# Patient Record
Sex: Male | Born: 1935
Health system: Southern US, Community
[De-identification: ages and names within clinical notes are randomized; demographics above are authoritative.]

## PROBLEM LIST (undated history)

## (undated) DIAGNOSIS — H409 Unspecified glaucoma: Secondary | ICD-10-CM

## (undated) DIAGNOSIS — G4730 Sleep apnea, unspecified: Secondary | ICD-10-CM

## (undated) DIAGNOSIS — I10 Essential (primary) hypertension: Secondary | ICD-10-CM

## (undated) DIAGNOSIS — N400 Enlarged prostate without lower urinary tract symptoms: Secondary | ICD-10-CM

## (undated) DIAGNOSIS — M1990 Unspecified osteoarthritis, unspecified site: Secondary | ICD-10-CM

## (undated) DIAGNOSIS — E785 Hyperlipidemia, unspecified: Secondary | ICD-10-CM

## (undated) DIAGNOSIS — K219 Gastro-esophageal reflux disease without esophagitis: Secondary | ICD-10-CM

## (undated) DIAGNOSIS — J45909 Unspecified asthma, uncomplicated: Secondary | ICD-10-CM

## (undated) HISTORY — DX: Hyperlipidemia, unspecified: E78.5

## (undated) HISTORY — PX: COSMETIC SURGERY: SHX468

## (undated) HISTORY — DX: Gastro-esophageal reflux disease without esophagitis: K21.9

## (undated) HISTORY — DX: Essential (primary) hypertension: I10

## (undated) HISTORY — DX: Benign prostatic hyperplasia without lower urinary tract symptoms: N40.0

## (undated) HISTORY — DX: Unspecified asthma, uncomplicated: J45.909

## (undated) HISTORY — DX: Unspecified glaucoma: H40.9

---

## 2004-12-18 ENCOUNTER — Ambulatory Visit: Payer: Self-pay

## 2006-05-15 ENCOUNTER — Ambulatory Visit: Payer: Self-pay | Admitting: Unknown Physician Specialty

## 2010-05-10 ENCOUNTER — Emergency Department: Payer: Self-pay | Admitting: Internal Medicine

## 2011-08-27 ENCOUNTER — Ambulatory Visit: Payer: Self-pay | Admitting: Unknown Physician Specialty

## 2011-12-19 ENCOUNTER — Ambulatory Visit: Payer: Self-pay | Admitting: Family Medicine

## 2013-12-21 DIAGNOSIS — J4520 Mild intermittent asthma, uncomplicated: Secondary | ICD-10-CM | POA: Insufficient documentation

## 2013-12-21 DIAGNOSIS — G4730 Sleep apnea, unspecified: Secondary | ICD-10-CM

## 2013-12-21 DIAGNOSIS — J449 Chronic obstructive pulmonary disease, unspecified: Secondary | ICD-10-CM | POA: Insufficient documentation

## 2013-12-21 DIAGNOSIS — G4710 Hypersomnia, unspecified: Secondary | ICD-10-CM | POA: Insufficient documentation

## 2015-04-06 DIAGNOSIS — N400 Enlarged prostate without lower urinary tract symptoms: Secondary | ICD-10-CM | POA: Insufficient documentation

## 2015-04-06 DIAGNOSIS — I10 Essential (primary) hypertension: Secondary | ICD-10-CM | POA: Insufficient documentation

## 2015-04-06 DIAGNOSIS — N138 Other obstructive and reflux uropathy: Secondary | ICD-10-CM | POA: Insufficient documentation

## 2015-04-06 DIAGNOSIS — N401 Enlarged prostate with lower urinary tract symptoms: Secondary | ICD-10-CM

## 2015-04-13 ENCOUNTER — Encounter: Payer: Self-pay | Admitting: Family Medicine

## 2015-04-13 ENCOUNTER — Ambulatory Visit (INDEPENDENT_AMBULATORY_CARE_PROVIDER_SITE_OTHER): Payer: Medicare Other | Admitting: Family Medicine

## 2015-04-13 VITALS — BP 132/77 | HR 82 | Temp 97.9°F | Resp 18 | Ht 68.0 in | Wt 262.5 lb

## 2015-04-13 DIAGNOSIS — I10 Essential (primary) hypertension: Secondary | ICD-10-CM | POA: Diagnosis not present

## 2015-04-13 DIAGNOSIS — G4730 Sleep apnea, unspecified: Secondary | ICD-10-CM | POA: Diagnosis not present

## 2015-04-13 DIAGNOSIS — E7800 Pure hypercholesterolemia, unspecified: Secondary | ICD-10-CM

## 2015-04-13 DIAGNOSIS — G4710 Hypersomnia, unspecified: Secondary | ICD-10-CM | POA: Diagnosis not present

## 2015-04-13 DIAGNOSIS — J42 Unspecified chronic bronchitis: Secondary | ICD-10-CM | POA: Diagnosis not present

## 2015-04-13 DIAGNOSIS — N138 Other obstructive and reflux uropathy: Secondary | ICD-10-CM

## 2015-04-13 DIAGNOSIS — N401 Enlarged prostate with lower urinary tract symptoms: Secondary | ICD-10-CM

## 2015-04-13 DIAGNOSIS — E785 Hyperlipidemia, unspecified: Secondary | ICD-10-CM | POA: Insufficient documentation

## 2015-04-13 MED ORDER — PRAVASTATIN SODIUM 40 MG PO TABS: 40.0000 mg | ORAL_TABLET | Freq: Every day | ORAL | Status: DC

## 2015-04-13 NOTE — Patient Instructions (Signed)
F/U 4 MO 

## 2015-04-13 NOTE — Progress Notes (Signed)
Name: Eugene Murphy   MRN: [id number]    DOB: 09-08-1932   Date:04/13/2015       Progress Note  Subjective  Chief Complaint  Chief Complaint  Patient presents with  . Hypertension    Hypertension This is a chronic problem. The current episode started more than 1 year ago. The problem is unchanged. The problem is controlled. Associated symptoms include anxiety and shortness of breath. Pertinent negatives include no blurred vision, chest pain, headaches, neck pain, orthopnea or palpitations. There are no associated agents to hypertension. Risk factors for coronary artery disease include dyslipidemia, male gender, obesity and sedentary lifestyle. Past treatments include ACE inhibitors. The current treatment provides moderate improvement. There are no compliance problems.   Hyperlipidemia This is a chronic problem. The current episode started more than 1 year ago. The problem is controlled. Recent lipid tests were reviewed and are normal. Exacerbating diseases include obesity. Factors aggravating his hyperlipidemia include fatty foods. Associated symptoms include shortness of breath. Pertinent negatives include no chest pain, focal weakness or myalgias. Current antihyperlipidemic treatment includes statins. The current treatment provides moderate improvement of lipids. There are no compliance problems.  Risk factors for coronary artery disease include diabetes mellitus, dyslipidemia, hypertension, male sex, obesity and a sedentary lifestyle.  Benign Prostatic Hypertrophy This is a chronic (SEES UROLOGIST) problem. The current episode started more than 1 year ago. Irritative symptoms include frequency, nocturia and urgency. Obstructive symptoms include incomplete emptying and a slower stream. Associated symptoms include hematuria. Pertinent negatives include no chills, dysuria, nausea or vomiting. He is sexually active. The symptoms are aggravated by caffeine. Past treatments include terazosin. The  treatment provided mild relief. He has been using treatment for 2 or more years.  Breathing Problem He complains of cough, difficulty breathing, shortness of breath and wheezing. There is no hemoptysis. Primary symptoms comments: SEES PULMONOLOGIST. This is a chronic problem. The current episode started more than 1 year ago. Associated symptoms include weight loss. Pertinent negatives include no chest pain, fever, headaches, heartburn, myalgias or sore throat. His symptoms are aggravated by exposure to fumes, pollen and change in weather. His symptoms are alleviated by steroid inhaler and beta-agonist. He reports moderate improvement on treatment.      No past medical history on file.  History  Substance Use Topics  . Smoking status: Never Smoker   . Smokeless tobacco: Never Used  . Alcohol Use: 0.0 oz/week    0 Standard drinks or equivalent per week     Comment: Occasional     Current outpatient prescriptions:  .  ASMANEX 30 METERED DOSES 220 MCG/INH inhaler, , Disp: , Rfl:  .  doxazosin (CARDURA) 8 MG tablet, , Disp: , Rfl:  .  enalapril (VASOTEC) 10 MG tablet, , Disp: , Rfl:  .  fluticasone (FLONASE) 50 MCG/ACT nasal spray, , Disp: , Rfl:  .  lansoprazole (PREVACID) 30 MG capsule, , Disp: , Rfl:  .  latanoprost (XALATAN) 0.005 % ophthalmic solution, , Disp: , Rfl:  .  levocetirizine (XYZAL) 5 MG tablet, , Disp: , Rfl:  .  pravastatin (PRAVACHOL) 40 MG tablet, Take 1 tablet (40 mg total) by mouth daily., Disp: 90 tablet, Rfl: 1 .  PROAIR HFA 108 (90 BASE) MCG/ACT inhaler, , Disp: , Rfl:  .  timolol (TIMOPTIC) 0.5 % ophthalmic solution, , Disp: , Rfl:   Allergies  Allergen Reactions  . Shellfish Allergy Swelling    Throat swells    Review of Systems  Constitutional: Positive for  weight loss. Negative for fever and chills.  HENT: Negative for congestion, hearing loss, sore throat and tinnitus.   Eyes: Negative for blurred vision, double vision and redness.  Respiratory:  Positive for cough, shortness of breath and wheezing. Negative for hemoptysis.   Cardiovascular: Positive for leg swelling. Negative for chest pain, palpitations, orthopnea and claudication.  Gastrointestinal: Negative.  Negative for heartburn, nausea, vomiting, diarrhea, constipation and blood in stool.  Genitourinary: Positive for urgency, frequency, hematuria, incomplete emptying and nocturia. Negative for dysuria.       Erectile dysfunction  Musculoskeletal: Positive for joint pain. Negative for myalgias, back pain, falls and neck pain.  Skin: Negative for itching.  Neurological: Negative for dizziness, tingling, tremors, focal weakness, seizures, loss of consciousness, weakness and headaches.  Endo/Heme/Allergies: Does not bruise/bleed easily.  Psychiatric/Behavioral: Negative for depression and substance abuse. The patient is not nervous/anxious and does not have insomnia.      Objective  Filed Vitals:   04/13/15 0854  BP: 132/77  Pulse: 82  Temp: 97.9 F (36.6 C)  TempSrc: Oral  Resp: 18  Height: 5\' 8"  (1.727 m)  Weight: 262 lb 8 oz (119.069 kg)  SpO2: 96%     Physical Exam  Constitutional: He is oriented to person, place, and time and well-developed, well-nourished, and in no distress.  OBESE  HENT:  Head: Normocephalic.  Eyes: EOM are normal. Pupils are equal, round, and reactive to light.  Neck: Normal range of motion. Neck supple. No thyromegaly present.  Cardiovascular: Normal rate, regular rhythm and normal heart sounds.   No murmur heard. Pulmonary/Chest: Effort normal and breath sounds normal. No respiratory distress. He has no wheezes.  Abdominal: Soft. Bowel sounds are normal.  Musculoskeletal: Normal range of motion. He exhibits edema.  Lymphadenopathy:    He has no cervical adenopathy.  Neurological: He is alert and oriented to person, place, and time. No cranial nerve deficit. Gait normal. Coordination normal.  Skin: Skin is warm and dry. No rash noted.   Psychiatric: Affect and judgment normal.      Assessment & Plan  1. Elevated cholesterol Recheck - pravastatin (PRAVACHOL) 40 MG tablet; Take 1 tablet (40 mg total) by mouth daily.  Dispense: 90 tablet; Refill: 1 - Comprehensive metabolic panel - Lipid panel - TSH  2. Essential hypertension Well-controlled  3. Chronic bronchitis, unspecified chronic bronchitis type Follow-up pulmonologist  4. Benign prostatic hyperplasia with urinary obstruction All by urologist  5. Hypersomnia with sleep apnea Regular CPAP needed

## 2015-04-16 LAB — COMPREHENSIVE METABOLIC PANEL
A/G RATIO: 1.6 (ref 1.1–2.5)
ALT: 35 IU/L (ref 0–44)
AST: 31 IU/L (ref 0–40)
Albumin: 4.2 g/dL (ref 3.5–4.7)
Alkaline Phosphatase: 34 IU/L — ABNORMAL LOW (ref 39–117)
BUN/Creatinine Ratio: 11 (ref 10–22)
BUN: 13 mg/dL (ref 8–27)
Bilirubin Total: 0.4 mg/dL (ref 0.0–1.2)
CO2: 26 mmol/L (ref 18–29)
Calcium: 9.4 mg/dL (ref 8.6–10.2)
Chloride: 100 mmol/L (ref 97–108)
Creatinine, Ser: 1.14 mg/dL (ref 0.76–1.27)
GFR, EST AFRICAN AMERICAN: 70 mL/min/{1.73_m2} (ref >59)
GFR, EST NON AFRICAN AMERICAN: 60 mL/min/{1.73_m2} (ref >59)
GLUCOSE: 90 mg/dL (ref 65–99)
Globulin, Total: 2.6 g/dL (ref 1.5–4.5)
Potassium: 4.7 mmol/L (ref 3.5–5.2)
SODIUM: 140 mmol/L (ref 134–144)
Total Protein: 6.8 g/dL (ref 6.0–8.5)

## 2015-04-16 LAB — TSH: TSH: 1.61 u[IU]/mL (ref 0.450–4.500)

## 2015-04-16 LAB — LIPID PANEL
CHOL/HDL RATIO: 3 ratio (ref 0.0–5.0)
Cholesterol, Total: 136 mg/dL (ref 100–199)
HDL: 46 mg/dL (ref >39)
LDL Calculated: 73 mg/dL (ref 0–99)
Triglycerides: 87 mg/dL (ref 0–149)
VLDL Cholesterol Cal: 17 mg/dL (ref 5–40)

## 2015-04-17 ENCOUNTER — Encounter: Payer: Self-pay | Admitting: Family Medicine

## 2015-04-18 ENCOUNTER — Telehealth: Payer: Self-pay | Admitting: Emergency Medicine

## 2015-04-18 NOTE — Telephone Encounter (Signed)
Patient wife [name] notified of lab results.

## 2015-05-11 ENCOUNTER — Other Ambulatory Visit: Payer: Self-pay | Admitting: Emergency Medicine

## 2015-05-11 DIAGNOSIS — E7800 Pure hypercholesterolemia, unspecified: Secondary | ICD-10-CM

## 2015-05-11 MED ORDER — PRAVASTATIN SODIUM 40 MG PO TABS: 40.0000 mg | ORAL_TABLET | Freq: Every day | ORAL | Status: DC

## 2015-06-08 ENCOUNTER — Other Ambulatory Visit: Payer: Self-pay | Admitting: Emergency Medicine

## 2015-06-08 MED ORDER — LANSOPRAZOLE 30 MG PO CPDR: 30.0000 mg | DELAYED_RELEASE_CAPSULE | Freq: Every day | ORAL | Status: DC

## 2015-06-08 MED ORDER — LANSOPRAZOLE 30 MG PO CPDR
30.0000 mg | DELAYED_RELEASE_CAPSULE | Freq: Every day | ORAL | Status: DC
Start: 2015-06-08 — End: 2015-06-08

## 2015-06-08 MED ORDER — ENALAPRIL MALEATE 10 MG PO TABS: 10.0000 mg | ORAL_TABLET | Freq: Every day | ORAL | Status: DC

## 2015-06-14 ENCOUNTER — Ambulatory Visit (INDEPENDENT_AMBULATORY_CARE_PROVIDER_SITE_OTHER): Payer: Medicare Other

## 2015-06-14 DIAGNOSIS — Z23 Encounter for immunization: Secondary | ICD-10-CM | POA: Diagnosis not present

## 2015-08-16 ENCOUNTER — Encounter: Payer: Medicare Other | Admitting: Family Medicine

## 2015-09-15 ENCOUNTER — Encounter: Payer: Self-pay | Admitting: Family Medicine

## 2015-09-15 ENCOUNTER — Ambulatory Visit (INDEPENDENT_AMBULATORY_CARE_PROVIDER_SITE_OTHER): Payer: Medicare Other | Admitting: Family Medicine

## 2015-09-15 VITALS — BP 128/62 | HR 94 | Temp 98.2°F | Resp 18 | Ht 68.0 in | Wt 262.3 lb

## 2015-09-15 DIAGNOSIS — J431 Panlobular emphysema: Secondary | ICD-10-CM | POA: Diagnosis not present

## 2015-09-15 DIAGNOSIS — I10 Essential (primary) hypertension: Secondary | ICD-10-CM | POA: Diagnosis not present

## 2015-09-15 DIAGNOSIS — E7800 Pure hypercholesterolemia, unspecified: Secondary | ICD-10-CM | POA: Diagnosis not present

## 2015-09-15 DIAGNOSIS — G4730 Sleep apnea, unspecified: Secondary | ICD-10-CM | POA: Diagnosis not present

## 2015-09-15 DIAGNOSIS — N4889 Other specified disorders of penis: Secondary | ICD-10-CM

## 2015-09-15 DIAGNOSIS — N138 Other obstructive and reflux uropathy: Secondary | ICD-10-CM

## 2015-09-15 DIAGNOSIS — N401 Enlarged prostate with lower urinary tract symptoms: Secondary | ICD-10-CM | POA: Diagnosis not present

## 2015-09-15 DIAGNOSIS — Z0000 Encounter for general adult medical examination without abnormal findings: Secondary | ICD-10-CM

## 2015-09-15 DIAGNOSIS — G4710 Hypersomnia, unspecified: Secondary | ICD-10-CM | POA: Diagnosis not present

## 2015-09-15 NOTE — Progress Notes (Addendum)
Name: Eugene Murphy   MRN: [id number]    DOB: 05-12-1932   Date:09/15/2015       Progress Note  Subjective  Chief Complaint  Chief Complaint  Patient presents with  . Annual Exam    HPI  81 year old male presenting for annual H&P. He has had some penile pain since last seen here is being followed by urologist for that. Otherwise baseline problems are stable.  Depression screen PHQ 2/9 09/15/2015  Decreased Interest 0  Down, Depressed, Hopeless 0  PHQ - 2 Score 0   Functional Status Survey: Is the patient deaf or have difficulty hearing?: No Does the patient have difficulty seeing, even when wearing glasses/contacts?: No Does the patient have difficulty concentrating, remembering, or making decisions?: No Does the patient have difficulty walking or climbing stairs?: No Does the patient have difficulty dressing or bathing?: No Does the patient have difficulty doing errands alone such as visiting a doctor's office or shopping?: No   Fall Risk  09/15/2015 04/13/2015  Falls in the past year? No No     No past medical history on file.  Social History  Substance Use Topics  . Smoking status: Never Smoker   . Smokeless tobacco: Never Used  . Alcohol Use: 0.0 oz/week    0 Standard drinks or equivalent per week     Comment: Occasional     Current outpatient prescriptions:  .  ASMANEX 30 METERED DOSES 220 MCG/INH inhaler, , Disp: , Rfl:  .  doxazosin (CARDURA) 8 MG tablet, , Disp: , Rfl:  .  enalapril (VASOTEC) 10 MG tablet, Take 1 tablet (10 mg total) by mouth daily., Disp: 90 tablet, Rfl: 1 .  fluticasone (FLONASE) 50 MCG/ACT nasal spray, , Disp: , Rfl:  .  lansoprazole (PREVACID) 30 MG capsule, Take 1 capsule (30 mg total) by mouth daily at 12 noon., Disp: 90 capsule, Rfl: 1 .  latanoprost (XALATAN) 0.005 % ophthalmic solution, , Disp: , Rfl:  .  levocetirizine (XYZAL) 5 MG tablet, , Disp: , Rfl:  .  pravastatin (PRAVACHOL) 40 MG tablet, Take 1 tablet (40 mg total) by mouth  daily., Disp: 90 tablet, Rfl: 1 .  PROAIR HFA 108 (90 BASE) MCG/ACT inhaler, , Disp: , Rfl:  .  timolol (TIMOPTIC) 0.5 % ophthalmic solution, , Disp: , Rfl:   Allergies  Allergen Reactions  . Shellfish Allergy Swelling    Throat swells    Review of Systems  Constitutional: Positive for malaise/fatigue. Negative for fever, chills and weight loss.  HENT: Positive for congestion. Negative for hearing loss, sore throat and tinnitus.   Eyes: Negative for blurred vision, double vision and redness.  Respiratory: Positive for shortness of breath and wheezing. Negative for cough and hemoptysis.   Cardiovascular: Negative for chest pain, palpitations, orthopnea, claudication and leg swelling.  Gastrointestinal: Negative for heartburn, nausea, vomiting, diarrhea, constipation and blood in stool.  Genitourinary: Negative for dysuria, urgency, frequency and hematuria.       PL pain and EGD followed by urologist  Musculoskeletal: Positive for joint pain. Negative for myalgias, back pain, falls and neck pain.  Skin: Negative for itching.  Neurological: Negative for dizziness, tingling, tremors, focal weakness, seizures, loss of consciousness, weakness and headaches.  Endo/Heme/Allergies: Does not bruise/bleed easily.  Psychiatric/Behavioral: Negative for depression and substance abuse. The patient is not nervous/anxious and does not have insomnia.      Objective  Filed Vitals:   09/15/15 1024  BP: 128/62  Pulse: 94  Temp: 98.2  F (36.8 C)  Resp: 18  Height: 5\' 8"  (1.727 m)  Weight: 262 lb 5 oz (118.984 kg)  SpO2: 96%     Physical Exam  Constitutional: He is oriented to person, place, and time.  Obese and in no acute distress.  HENT:  Head: Normocephalic.  Eyes: EOM are normal. Pupils are equal, round, and reactive to light.  Neck: Normal range of motion. Neck supple. No thyromegaly present.  Cardiovascular: Normal rate, regular rhythm and normal heart sounds.   No murmur  heard. Pulmonary/Chest: Effort normal and breath sounds normal. No respiratory distress. He has no wheezes.  Abdominal: Soft. Bowel sounds are normal.  Genitourinary:  Per urologist  Musculoskeletal: Normal range of motion. He exhibits no edema.  Lymphadenopathy:    He has no cervical adenopathy.  Neurological: He is alert and oriented to person, place, and time. No cranial nerve deficit. Gait normal. Coordination normal.  Skin: Skin is warm and dry. No rash noted.  Psychiatric: Affect and judgment normal.      Assessment & Plan   1. Annual physical exam No new findings  2. Essential hypertension Stable  3. Panlobular emphysema ([place]) Stable followed by pulmonologist  4. Benign prostatic hyperplasia with urinary obstruction Stable followed by urologist  5. Pain in penis Followed by urologist  6. Hypersomnia with sleep apnea Using CPAP  7. Elevated cholesterol Labs on return visit

## 2015-09-15 NOTE — Patient Instructions (Signed)
Obesity Obesity is defined as having too much total body fat and a body mass index (BMI) of 30 or more. BMI is an estimate of body fat and is calculated from your height and weight. BMI is typically calculated by your health care provider during regular wellness visits. Obesity happens when you consume more calories than you can burn by exercising or performing daily physical tasks. Prolonged obesity can cause major illnesses or emergencies, such as:  Stroke.  Heart disease.  Diabetes.  Cancer.  Arthritis.  High blood pressure (hypertension).  High cholesterol.  Sleep apnea.  Erectile dysfunction.  Infertility problems. CAUSES   Regularly eating unhealthy foods.  Physical inactivity.  Certain disorders, such as an underactive thyroid (hypothyroidism), Cushing's syndrome, and polycystic ovarian syndrome.  Certain medicines, such as steroids, some depression medicines, and antipsychotics.  Genetics.  Lack of sleep. DIAGNOSIS A health care provider can diagnose obesity after calculating your BMI. Obesity will be diagnosed if your BMI is 30 or higher. There are other methods of measuring obesity levels. Some other methods include measuring your skinfold thickness, your waist circumference, and comparing your hip circumference to your waist circumference. TREATMENT  A healthy treatment program includes some or all of the following:  Long-term dietary changes.  Exercise and physical activity.  Behavioral and lifestyle changes.  Medicine only under the supervision of your health care provider. Medicines may help, but only if they are used with diet and exercise programs. If your BMI is 40 or higher, your health care provider may recommend specialized surgery or programs to help with weight loss. An unhealthy treatment program includes:  Fasting.  Fad diets.  Supplements and drugs. These choices do not succeed in long-term weight control. HOME CARE  INSTRUCTIONS  Exercise and perform physical activity as directed by your health care provider. To increase physical activity, try the following:  Use stairs instead of elevators.  Park farther away from store entrances.  Garden, bike, or walk instead of watching television or using the computer.  Eat healthy, low-calorie foods and drinks on a regular basis. Eat more fruits and vegetables. Use low-calorie cookbooks or take healthy cooking classes.  Limit fast food, sweets, and processed snack foods.  Eat smaller portions.  Keep a daily journal of everything you eat. There are many free websites to help you with this. It may be helpful to measure your foods so you can determine if you are eating the correct portion sizes.  Avoid drinking alcohol. Drink more water and drinks without calories.  Take vitamins and supplements only as recommended by your health care provider.  Weight-loss support groups, registered dietitians, counselors, and stress reduction education can also be very helpful. SEEK IMMEDIATE MEDICAL CARE IF:  You have chest pain or tightness.  You have trouble breathing or feel short of breath.  You have weakness or leg numbness.  You feel confused or have trouble talking.  You have sudden changes in your vision.   This information is not intended to replace advice given to you by your health care provider. Make sure you discuss any questions you have with your health care provider.   Document Released: 11/01/2004 Document Revised: 10/15/2014 Document Reviewed: 10/31/2011 Elsevier Interactive Patient Education 2016 [employer]  

## 2015-09-16 NOTE — Addendum Note (Signed)
Addended by: Ashok Norris on: 09/16/2015 08:23 AM   Modules accepted: Miquel Dunn

## 2015-10-17 ENCOUNTER — Ambulatory Visit: Payer: Medicare Other | Admitting: Family Medicine

## 2015-10-20 ENCOUNTER — Encounter: Payer: Self-pay | Admitting: Family Medicine

## 2015-10-20 ENCOUNTER — Ambulatory Visit (INDEPENDENT_AMBULATORY_CARE_PROVIDER_SITE_OTHER): Payer: Medicare Other | Admitting: Family Medicine

## 2015-10-20 VITALS — BP 118/64 | HR 76 | Temp 98.3°F | Resp 16 | Ht 68.0 in | Wt 267.4 lb

## 2015-10-20 DIAGNOSIS — E7800 Pure hypercholesterolemia, unspecified: Secondary | ICD-10-CM

## 2015-10-20 DIAGNOSIS — G4710 Hypersomnia, unspecified: Secondary | ICD-10-CM

## 2015-10-20 DIAGNOSIS — J432 Centrilobular emphysema: Secondary | ICD-10-CM

## 2015-10-20 DIAGNOSIS — N401 Enlarged prostate with lower urinary tract symptoms: Secondary | ICD-10-CM | POA: Diagnosis not present

## 2015-10-20 DIAGNOSIS — N138 Other obstructive and reflux uropathy: Secondary | ICD-10-CM

## 2015-10-20 DIAGNOSIS — I10 Essential (primary) hypertension: Secondary | ICD-10-CM | POA: Diagnosis not present

## 2015-10-20 DIAGNOSIS — G4730 Sleep apnea, unspecified: Secondary | ICD-10-CM | POA: Diagnosis not present

## 2015-10-20 LAB — POCT URINALYSIS DIPSTICK
Bilirubin, UA: NEGATIVE
Glucose, UA: NEGATIVE
Ketones, UA: NEGATIVE
Leukocytes, UA: NEGATIVE
Nitrite, UA: NEGATIVE
Protein, UA: NEGATIVE
RBC UA: NEGATIVE

## 2015-10-20 MED ORDER — PRAVASTATIN SODIUM 40 MG PO TABS: 40.0000 mg | ORAL_TABLET | Freq: Every day | ORAL | Status: DC

## 2015-10-20 MED ORDER — LEVOCETIRIZINE DIHYDROCHLORIDE 5 MG PO TABS: 5.0000 mg | ORAL_TABLET | Freq: Every evening | ORAL | Status: DC

## 2015-10-21 LAB — LIPID PANEL
CHOL/HDL RATIO: 2.6 ratio (ref 0.0–5.0)
Cholesterol, Total: 123 mg/dL (ref 100–199)
HDL: 48 mg/dL (ref >39)
LDL Calculated: 63 mg/dL (ref 0–99)
Triglycerides: 61 mg/dL (ref 0–149)
VLDL CHOLESTEROL CAL: 12 mg/dL (ref 5–40)

## 2015-10-21 LAB — PSA: PROSTATE SPECIFIC AG, SERUM: 6.2 ng/mL — AB (ref 0.0–4.0)

## 2015-10-21 LAB — CBC
HEMOGLOBIN: 12.5 g/dL — AB (ref 12.6–17.7)
Hematocrit: 36.7 % — ABNORMAL LOW (ref 37.5–51.0)
MCH: 30.9 pg (ref 26.6–33.0)
MCHC: 34.1 g/dL (ref 31.5–35.7)
MCV: 91 fL (ref 79–97)
Platelets: 215 10*3/uL (ref 150–379)
RBC: 4.04 x10E6/uL — AB (ref 4.14–5.80)
RDW: 13.3 % (ref 12.3–15.4)
WBC: 5.9 10*3/uL (ref 3.4–10.8)

## 2015-10-21 LAB — COMPREHENSIVE METABOLIC PANEL
ALT: 24 IU/L (ref 0–44)
AST: 20 IU/L (ref 0–40)
Albumin/Globulin Ratio: 1.6 (ref 1.1–2.5)
Albumin: 4.4 g/dL (ref 3.5–4.7)
Alkaline Phosphatase: 41 IU/L (ref 39–117)
BUN/Creatinine Ratio: 11 (ref 10–22)
BUN: 11 mg/dL (ref 8–27)
Bilirubin Total: 0.4 mg/dL (ref 0.0–1.2)
CALCIUM: 9.7 mg/dL (ref 8.6–10.2)
CO2: 25 mmol/L (ref 18–29)
Chloride: 102 mmol/L (ref 96–106)
Creatinine, Ser: 1.03 mg/dL (ref 0.76–1.27)
GFR, EST AFRICAN AMERICAN: 78 mL/min/{1.73_m2} (ref >59)
GFR, EST NON AFRICAN AMERICAN: 68 mL/min/{1.73_m2} (ref >59)
GLUCOSE: 86 mg/dL (ref 65–99)
Globulin, Total: 2.7 g/dL (ref 1.5–4.5)
Potassium: 4.9 mmol/L (ref 3.5–5.2)
Sodium: 142 mmol/L (ref 134–144)
TOTAL PROTEIN: 7.1 g/dL (ref 6.0–8.5)

## 2015-10-21 LAB — TSH: TSH: 1.42 u[IU]/mL (ref 0.450–4.500)

## 2015-10-28 ENCOUNTER — Telehealth: Payer: Self-pay | Admitting: Family Medicine

## 2015-10-28 ENCOUNTER — Other Ambulatory Visit: Payer: Self-pay | Admitting: Family Medicine

## 2015-10-28 DIAGNOSIS — R972 Elevated prostate specific antigen [PSA]: Secondary | ICD-10-CM

## 2015-10-28 NOTE — Progress Notes (Signed)
Patient notified of labs.   

## 2015-10-28 NOTE — Telephone Encounter (Signed)
ERRENOUS °

## 2015-11-02 DIAGNOSIS — R972 Elevated prostate specific antigen [PSA]: Secondary | ICD-10-CM | POA: Insufficient documentation

## 2015-11-22 NOTE — Addendum Note (Signed)
Addended by: Ashok Norris on: 11/22/2015 08:51 AM   Modules accepted: Miquel Dunn

## 2015-11-22 NOTE — Progress Notes (Signed)
Name: Eugene Murphy   MRN: [id number]    DOB: 1931-10-22   Date:11/22/2015       Progress Note  Subjective  Chief Complaint  Chief Complaint  Patient presents with  . Benign Prostatic Hypertrophy    HPI  Hypertension   Patient presents for follow-up of hypertension. It has been present for over 5 years.  Patient states that there is compliance with medical regimen which consists of Vasotec 10 mg daily . There is no end organ disease. Cardiac risk factors include hypertension hyperlipidemia and diabetes.  Exercise regimen consist of minimal walking .  Diet consist of minimal salt restriction .  COPD history of present illness  Patient has a long-standing history of COPD is currently being followed by pulmonologist. Current regimen air and Asmanex  Sleep apnea  Patient has reported apneic episodes by his wife and has quite loud snoring. He occasionally awakes during the night slightly short of breath. He has daytime somnolence as well. There is no following asleep at the wheel while driving. He does have frequent headaches. He is obese.  History reviewed. No pertinent past medical history.  Social History  Substance Use Topics  . Smoking status: Never Smoker   . Smokeless tobacco: Never Used  . Alcohol Use: 0.0 oz/week    0 Standard drinks or equivalent per week     Comment: Occasional     Current outpatient prescriptions:  .  ASMANEX 30 METERED DOSES 220 MCG/INH inhaler, , Disp: , Rfl:  .  doxazosin (CARDURA) 8 MG tablet, , Disp: , Rfl:  .  enalapril (VASOTEC) 10 MG tablet, Take 1 tablet (10 mg total) by mouth daily., Disp: 90 tablet, Rfl: 1 .  fluticasone (FLONASE) 50 MCG/ACT nasal spray, , Disp: , Rfl:  .  lansoprazole (PREVACID) 30 MG capsule, Take 1 capsule (30 mg total) by mouth daily at 12 noon., Disp: 90 capsule, Rfl: 1 .  latanoprost (XALATAN) 0.005 % ophthalmic solution, , Disp: , Rfl:  .  levocetirizine (XYZAL) 5 MG tablet, Take 1 tablet (5 mg total) by mouth  every evening., Disp: 90 tablet, Rfl: 1 .  pravastatin (PRAVACHOL) 40 MG tablet, Take 1 tablet (40 mg total) by mouth daily., Disp: 90 tablet, Rfl: 1 .  PROAIR HFA 108 (90 BASE) MCG/ACT inhaler, , Disp: , Rfl:  .  timolol (TIMOPTIC) 0.5 % ophthalmic solution, , Disp: , Rfl:   Allergies  Allergen Reactions  . Shellfish Allergy Swelling    Throat swells    Review of Systems  Constitutional: Positive for malaise/fatigue. Negative for fever, chills and weight loss.  HENT: Positive for congestion. Negative for hearing loss, sore throat and tinnitus.   Eyes: Negative for blurred vision, double vision and redness.  Respiratory: Positive for shortness of breath and wheezing. Negative for cough and hemoptysis.   Cardiovascular: Negative for chest pain, palpitations, orthopnea, claudication and leg swelling.  Gastrointestinal: Negative for heartburn, nausea, vomiting, diarrhea, constipation and blood in stool.  Genitourinary: Negative for dysuria, urgency, frequency and hematuria.  Musculoskeletal: Positive for joint pain. Negative for myalgias, back pain, falls and neck pain.  Skin: Negative for itching.  Neurological: Positive for weakness and headaches. Negative for dizziness, tingling, tremors, focal weakness, seizures and loss of consciousness.  Endo/Heme/Allergies: Does not bruise/bleed easily.  Psychiatric/Behavioral: Negative for depression and substance abuse. The patient is not nervous/anxious and does not have insomnia.      Objective  Filed Vitals:   10/20/15 0849  BP: 118/64  Pulse:  76  Temp: 98.3 F (36.8 C)  Resp: 16  Height: 5\' 8"  (1.727 m)  Weight: 267 lb 7 oz (121.309 kg)  SpO2: 97%     Physical Exam  Constitutional: He is oriented to person, place, and time.  Obesity no acute distress  HENT:  Head: Normocephalic.  Eyes: EOM are normal. Pupils are equal, round, and reactive to light.  Neck: Normal range of motion. Neck supple. No thyromegaly present.   Cardiovascular: Normal rate, regular rhythm and normal heart sounds.   No murmur heard. Pulmonary/Chest: Effort normal and breath sounds normal. No respiratory distress. He has no wheezes.  Abdominal: Soft. Bowel sounds are normal.  Musculoskeletal: Normal range of motion. He exhibits no edema.  Lymphadenopathy:    He has no cervical adenopathy.  Neurological: He is alert and oriented to person, place, and time. No cranial nerve deficit. Gait normal. Coordination normal.  Skin: Skin is warm and dry. No rash noted.  Psychiatric: Affect and judgment normal.      Assessment & Plan   1. Essential hypertension Well-controlled - CBC - Comprehensive Metabolic Panel (CMET) - Lipid panel - TSH  2. Centrilobular emphysema ([place]) Followed by pulmonologist and well-controlled  3. Benign prostatic hyperplasia with urinary obstruction Stable - POCT Urinalysis Dipstick - PSA  4. Hypersomnia with sleep apnea Her pulmonologist  5. Elevated cholesterol Renew Pravachol and obtain lipid and metabolic panel - pravastatin (PRAVACHOL) 40 MG tablet; Take 1 tablet (40 mg total) by mouth daily.  Dispense: 90 tablet; Refill: 1

## 2015-12-14 ENCOUNTER — Other Ambulatory Visit: Payer: Self-pay | Admitting: Family Medicine

## 2015-12-14 MED ORDER — LANSOPRAZOLE 30 MG PO CPDR: 30.0000 mg | DELAYED_RELEASE_CAPSULE | Freq: Every day | ORAL | Status: DC

## 2015-12-14 MED ORDER — DOXAZOSIN MESYLATE 8 MG PO TABS: 8.0000 mg | ORAL_TABLET | Freq: Every day | ORAL | Status: DC

## 2015-12-14 MED ORDER — DUTASTERIDE 0.5 MG PO CAPS: 0.5000 mg | ORAL_CAPSULE | Freq: Every day | ORAL | Status: DC

## 2015-12-14 MED ORDER — ENALAPRIL MALEATE 10 MG PO TABS: 10.0000 mg | ORAL_TABLET | Freq: Every day | ORAL | Status: DC

## 2016-02-21 ENCOUNTER — Ambulatory Visit: Payer: Medicare Other | Admitting: Family Medicine

## 2016-02-29 ENCOUNTER — Ambulatory Visit (INDEPENDENT_AMBULATORY_CARE_PROVIDER_SITE_OTHER): Payer: Medicare Other | Admitting: Family Medicine

## 2016-02-29 ENCOUNTER — Encounter: Payer: Self-pay | Admitting: Family Medicine

## 2016-02-29 VITALS — BP 138/76 | HR 81 | Temp 98.2°F | Resp 16 | Ht 68.0 in | Wt 259.2 lb

## 2016-02-29 DIAGNOSIS — J3089 Other allergic rhinitis: Secondary | ICD-10-CM | POA: Diagnosis not present

## 2016-02-29 DIAGNOSIS — I10 Essential (primary) hypertension: Secondary | ICD-10-CM | POA: Diagnosis not present

## 2016-02-29 DIAGNOSIS — N400 Enlarged prostate without lower urinary tract symptoms: Secondary | ICD-10-CM

## 2016-02-29 DIAGNOSIS — R079 Chest pain, unspecified: Secondary | ICD-10-CM | POA: Diagnosis not present

## 2016-02-29 DIAGNOSIS — Z131 Encounter for screening for diabetes mellitus: Secondary | ICD-10-CM

## 2016-02-29 LAB — POCT GLYCOSYLATED HEMOGLOBIN (HGB A1C): HEMOGLOBIN A1C: 5.5

## 2016-02-29 MED ORDER — DOXAZOSIN MESYLATE 8 MG PO TABS
8.0000 mg | ORAL_TABLET | Freq: Every day | ORAL | Status: DC
Start: 2016-02-29 — End: 2016-05-16

## 2016-02-29 MED ORDER — ENALAPRIL MALEATE 10 MG PO TABS
10.0000 mg | ORAL_TABLET | Freq: Every day | ORAL | Status: DC
Start: 2016-02-29 — End: 2016-05-16

## 2016-02-29 MED ORDER — LEVOCETIRIZINE DIHYDROCHLORIDE 5 MG PO TABS: 5.0000 mg | ORAL_TABLET | Freq: Every evening | ORAL | Status: DC

## 2016-02-29 NOTE — Progress Notes (Signed)
Name: Eugene Murphy   MRN: [id number]    DOB: 04/13/1932   Date:02/29/2016       Progress Note  Subjective  Chief Complaint  Chief Complaint  Patient presents with  . Hypertension    med refills  . Chest Pain    arm numb at night. To see Dr. Ubaldo Glassing in June    Hypertension This is a chronic problem. The problem is controlled. Associated symptoms include chest pain (intermittent chest pressure, no pain at present.). Pertinent negatives include no blurred vision, headaches, palpitations or shortness of breath. Past treatments include ACE inhibitors. There is no history of kidney disease, CAD/MI or CVA.  Benign Prostatic Hypertrophy This is a chronic problem. Irritative symptoms do not include frequency. Obstructive symptoms do not include an intermittent stream or straining. Pertinent negatives include no chills, nausea or vomiting. Past treatments include doxazosin. The treatment provided significant relief.   Allergies and Asthma: Environmental allergies (mainly dust, pet dander, grass), takes Xyzal every day, also has history of Asthma, which sometimes flares up. Currently being followed by Pulmonologist.  History reviewed. No pertinent past medical history.  History reviewed. No pertinent past surgical history.  History reviewed. No pertinent family history.  Social History   Social History  . Marital Status: Married    Spouse Name: N/A  . Number of Children: N/A  . Years of Education: N/A   Occupational History  . Not on file.   Social History Main Topics  . Smoking status: Never Smoker   . Smokeless tobacco: Never Used  . Alcohol Use: 0.0 oz/week    0 Standard drinks or equivalent per week     Comment: Occasional  . Drug Use: No  . Sexual Activity: Yes   Other Topics Concern  . Not on file   Social History Narrative     Current outpatient prescriptions:  .  ASMANEX 30 METERED DOSES 220 MCG/INH inhaler, , Disp: , Rfl:  .  doxazosin (CARDURA) 8 MG tablet, Take  1 tablet (8 mg total) by mouth daily., Disp: 90 tablet, Rfl: 0 .  dutasteride (AVODART) 0.5 MG capsule, Take 1 capsule (0.5 mg total) by mouth daily., Disp: 90 capsule, Rfl: 0 .  enalapril (VASOTEC) 10 MG tablet, Take 1 tablet (10 mg total) by mouth daily., Disp: 90 tablet, Rfl: 0 .  fluticasone (FLONASE) 50 MCG/ACT nasal spray, , Disp: , Rfl:  .  lansoprazole (PREVACID) 30 MG capsule, Take 1 capsule (30 mg total) by mouth daily at 12 noon., Disp: 90 capsule, Rfl: 1 .  latanoprost (XALATAN) 0.005 % ophthalmic solution, , Disp: , Rfl:  .  levocetirizine (XYZAL) 5 MG tablet, Take 1 tablet (5 mg total) by mouth every evening., Disp: 90 tablet, Rfl: 1 .  pravastatin (PRAVACHOL) 40 MG tablet, Take 1 tablet (40 mg total) by mouth daily., Disp: 90 tablet, Rfl: 1 .  PROAIR HFA 108 (90 BASE) MCG/ACT inhaler, , Disp: , Rfl:  .  timolol (TIMOPTIC) 0.5 % ophthalmic solution, , Disp: , Rfl:   Allergies  Allergen Reactions  . Shellfish Allergy Swelling    Throat swells     Review of Systems  Constitutional: Negative for chills.  Eyes: Negative for blurred vision.  Respiratory: Negative for shortness of breath.   Cardiovascular: Positive for chest pain (intermittent chest pressure, no pain at present.). Negative for palpitations.  Gastrointestinal: Negative for nausea and vomiting.  Genitourinary: Negative for frequency.  Neurological: Negative for headaches.   Objective  Filed Vitals:  02/29/16 1002  BP: 138/76  Pulse: 81  Temp: 98.2 F (36.8 C)  TempSrc: Oral  Resp: 16  Height: 5\' 8"  (1.727 m)  Weight: 259 lb 3.2 oz (117.572 kg)  SpO2: 97%    Physical Exam  Constitutional: He is well-developed, well-nourished, and in no distress.  HENT:  Mouth/Throat: No posterior oropharyngeal erythema.  Cardiovascular: Normal heart sounds.  A regularly irregular rhythm present.  Pulmonary/Chest: Breath sounds normal.  Abdominal: Bowel sounds are normal. There is no tenderness.  Nursing note and  vitals reviewed.      Assessment & Plan  1. Essential hypertension Blood pressure stable and controlled on therapy - enalapril (VASOTEC) 10 MG tablet; Take 1 tablet (10 mg total) by mouth daily.  Dispense: 90 tablet; Refill: 0  2. Environmental and seasonal allergies  - levocetirizine (XYZAL) 5 MG tablet; Take 1 tablet (5 mg total) by mouth every evening.  Dispense: 90 tablet; Refill: 1  3. Chest pain, unspecified chest pain type EKG is reassuring, no ST-T changes, borderline first-degree AV block. Patient has an appointment scheduled with cardiology. - EKG 12-Lead  4. BPH without obstruction/lower urinary tract symptoms  - doxazosin (CARDURA) 8 MG tablet; Take 1 tablet (8 mg total) by mouth daily.  Dispense: 90 tablet; Refill: 0  5. Screening for diabetes mellitus (DM)  - POCT HgB A1C   Tayla Panozzo Asad [facility] 02/29/2016 10:57 AM

## 2016-03-26 ENCOUNTER — Other Ambulatory Visit: Payer: Self-pay | Admitting: Emergency Medicine

## 2016-03-26 DIAGNOSIS — J3089 Other allergic rhinitis: Secondary | ICD-10-CM

## 2016-03-26 MED ORDER — LEVOCETIRIZINE DIHYDROCHLORIDE 5 MG PO TABS: 5.0000 mg | ORAL_TABLET | Freq: Every evening | ORAL | Status: DC

## 2016-05-16 ENCOUNTER — Other Ambulatory Visit: Payer: Self-pay | Admitting: Emergency Medicine

## 2016-05-16 DIAGNOSIS — N400 Enlarged prostate without lower urinary tract symptoms: Secondary | ICD-10-CM

## 2016-05-16 DIAGNOSIS — I10 Essential (primary) hypertension: Secondary | ICD-10-CM

## 2016-05-16 DIAGNOSIS — E7800 Pure hypercholesterolemia, unspecified: Secondary | ICD-10-CM

## 2016-05-16 MED ORDER — ENALAPRIL MALEATE 10 MG PO TABS: 10.0000 mg | ORAL_TABLET | Freq: Every day | ORAL | 0 refills | Status: DC

## 2016-05-16 MED ORDER — DOXAZOSIN MESYLATE 8 MG PO TABS: 8.0000 mg | ORAL_TABLET | Freq: Every day | ORAL | 0 refills | Status: DC

## 2016-05-16 MED ORDER — PRAVASTATIN SODIUM 40 MG PO TABS: 40.0000 mg | ORAL_TABLET | Freq: Every day | ORAL | 0 refills | Status: DC

## 2016-05-31 ENCOUNTER — Ambulatory Visit: Payer: Medicare Other | Admitting: Family Medicine

## 2016-06-07 ENCOUNTER — Encounter: Payer: Self-pay | Admitting: Family Medicine

## 2016-06-07 ENCOUNTER — Ambulatory Visit (INDEPENDENT_AMBULATORY_CARE_PROVIDER_SITE_OTHER): Payer: Medicare Other | Admitting: Family Medicine

## 2016-06-07 VITALS — BP 134/78 | HR 85 | Temp 98.6°F | Resp 18 | Ht 68.0 in | Wt 257.1 lb

## 2016-06-07 DIAGNOSIS — E785 Hyperlipidemia, unspecified: Secondary | ICD-10-CM

## 2016-06-07 DIAGNOSIS — I10 Essential (primary) hypertension: Secondary | ICD-10-CM | POA: Diagnosis not present

## 2016-06-07 DIAGNOSIS — N400 Enlarged prostate without lower urinary tract symptoms: Secondary | ICD-10-CM | POA: Diagnosis not present

## 2016-06-07 MED ORDER — ENALAPRIL MALEATE 10 MG PO TABS: 10.0000 mg | ORAL_TABLET | Freq: Every day | ORAL | 1 refills | Status: DC

## 2016-06-07 MED ORDER — PRAVASTATIN SODIUM 40 MG PO TABS: 40.0000 mg | ORAL_TABLET | Freq: Every day | ORAL | 1 refills | Status: DC

## 2016-06-07 NOTE — Progress Notes (Signed)
Name: Eugene Murphy   MRN: [id number]    DOB: 1932/07/30   Date:06/07/2016       Progress Note  Subjective  Chief Complaint  Chief Complaint  Patient presents with  . Follow-up    3 mo  . Medication Refill    Hypertension  This is a chronic problem. The problem is unchanged. The problem is controlled. Pertinent negatives include no blurred vision, chest pain, headaches, malaise/fatigue, palpitations or shortness of breath. Past treatments include ACE inhibitors. There is no history of kidney disease, CAD/MI or CVA.  Hyperlipidemia  This is a chronic problem. The problem is controlled. Recent lipid tests were reviewed and are normal. Pertinent negatives include no chest pain, leg pain, myalgias or shortness of breath. Current antihyperlipidemic treatment includes statins.  Benign Prostatic Hypertrophy  This is a chronic problem. The problem is unchanged. Pertinent negatives include no chills. Past treatments include dutasteride and tamsulosin.     Past Medical History:  Diagnosis Date  . Asthma   . BPH (benign prostatic hyperplasia)    Followed by Bonna Gains.  Marland Kitchen GERD (gastroesophageal reflux disease)   . Hyperlipidemia   . Hypertension     Past Surgical History:  Procedure Laterality Date  . COSMETIC SURGERY Left    Left Arm Plastic Surgery in 1970s    History reviewed. No pertinent family history.  Social History   Social History  . Marital status: Married    Spouse name: N/A  . Number of children: N/A  . Years of education: N/A   Occupational History  . Not on file.   Social History Main Topics  . Smoking status: Never Smoker  . Smokeless tobacco: Never Used  . Alcohol use 0.0 oz/week     Comment: Occasional  . Drug use: No  . Sexual activity: Yes   Other Topics Concern  . Not on file   Social History Narrative  . No narrative on file     Current Outpatient Prescriptions:  .  ASMANEX 30 METERED DOSES 220 MCG/INH inhaler, , Disp: , Rfl:  .   doxazosin (CARDURA) 8 MG tablet, Take 1 tablet (8 mg total) by mouth daily., Disp: 90 tablet, Rfl: 0 .  dutasteride (AVODART) 0.5 MG capsule, Take 1 capsule (0.5 mg total) by mouth daily., Disp: 90 capsule, Rfl: 0 .  enalapril (VASOTEC) 10 MG tablet, Take 1 tablet (10 mg total) by mouth daily., Disp: 90 tablet, Rfl: 0 .  fluticasone (FLONASE) 50 MCG/ACT nasal spray, , Disp: , Rfl:  .  lansoprazole (PREVACID) 30 MG capsule, Take 1 capsule (30 mg total) by mouth daily at 12 noon., Disp: 90 capsule, Rfl: 1 .  latanoprost (XALATAN) 0.005 % ophthalmic solution, , Disp: , Rfl:  .  levocetirizine (XYZAL) 5 MG tablet, Take 1 tablet (5 mg total) by mouth every evening., Disp: 90 tablet, Rfl: 1 .  pravastatin (PRAVACHOL) 40 MG tablet, Take 1 tablet (40 mg total) by mouth daily., Disp: 90 tablet, Rfl: 0 .  PROAIR HFA 108 (90 BASE) MCG/ACT inhaler, , Disp: , Rfl:  .  timolol (TIMOPTIC) 0.5 % ophthalmic solution, , Disp: , Rfl:   Allergies  Allergen Reactions  . Shellfish Allergy Swelling    Throat swells     Review of Systems  Constitutional: Negative for chills, fever and malaise/fatigue.  Eyes: Negative for blurred vision.  Respiratory: Negative for shortness of breath.   Cardiovascular: Negative for chest pain and palpitations.  Musculoskeletal: Negative for myalgias.  Neurological: Negative for  headaches.    Objective  Vitals:   06/07/16 1011  BP: 134/78  Pulse: 85  Resp: 18  Temp: 98.6 F (37 C)  TempSrc: Oral  SpO2: 96%  Weight: 257 lb 1.6 oz (116.6 kg)  Height: 5\' 8"  (1.727 m)    Physical Exam  Constitutional: He is oriented to person, place, and time and well-developed, well-nourished, and in no distress.  Cardiovascular: Normal rate, regular rhythm, S1 normal, S2 normal and normal heart sounds.   No murmur heard. Pulmonary/Chest: Effort normal and breath sounds normal. He has no wheezes.  Abdominal: Soft. Normal appearance, normal aorta and bowel sounds are normal. There  is no tenderness.  Neurological: He is alert and oriented to person, place, and time.  Psychiatric: Mood, memory, affect and judgment normal.  Nursing note and vitals reviewed.    Assessment & Plan  1. Dyslipidemia Lipid panel from January 2017 at goal. Repeat in 6 month - pravastatin (PRAVACHOL) 40 MG tablet; Take 1 tablet (40 mg total) by mouth daily.  Dispense: 90 tablet; Refill: 1  2. Essential hypertension BP stable and controlled on present anti-hypertensive therapy - enalapril (VASOTEC) 10 MG tablet; Take 1 tablet (10 mg total) by mouth daily.  Dispense: 90 tablet; Refill: 1  3. BPH without obstruction/lower urinary tract symptoms Patient is being followed by urology, last PSA obtained by Dr. Rutherford Nail was elevated and he was referred to urology. Recommended that he obtain refills for dutasteride from the urologist   [facility] 06/07/2016 11:08 AM

## 2016-06-15 ENCOUNTER — Ambulatory Visit (INDEPENDENT_AMBULATORY_CARE_PROVIDER_SITE_OTHER): Payer: Medicare Other

## 2016-06-15 DIAGNOSIS — Z23 Encounter for immunization: Secondary | ICD-10-CM | POA: Diagnosis not present

## 2016-07-23 ENCOUNTER — Telehealth: Payer: Self-pay

## 2016-07-23 MED ORDER — LANSOPRAZOLE 30 MG PO CPDR
30.0000 mg | DELAYED_RELEASE_CAPSULE | Freq: Every day | ORAL | 1 refills | Status: DC
Start: 2016-07-23 — End: 2016-07-25

## 2016-07-23 NOTE — Telephone Encounter (Signed)
Mediation has been refilled and [facility]

## 2016-07-25 ENCOUNTER — Telehealth: Payer: Self-pay | Admitting: Family Medicine

## 2016-07-25 DIAGNOSIS — K219 Gastro-esophageal reflux disease without esophagitis: Secondary | ICD-10-CM | POA: Insufficient documentation

## 2016-07-25 MED ORDER — LANSOPRAZOLE 30 MG PO CPDR: 30.0000 mg | DELAYED_RELEASE_CAPSULE | Freq: Every day | ORAL | 1 refills | Status: DC

## 2016-07-25 NOTE — Telephone Encounter (Signed)
Pt needs refill on Lansoprazole to be sent to [facility].

## 2016-07-25 NOTE — Telephone Encounter (Signed)
Prescription for lansoprazole 30 mg daily is sent to patient's mail order pharmacy

## 2016-07-26 NOTE — Telephone Encounter (Signed)
Pt informed

## 2016-12-05 ENCOUNTER — Ambulatory Visit: Payer: Medicare Other | Admitting: Family Medicine

## 2016-12-17 ENCOUNTER — Encounter: Payer: Self-pay | Admitting: Family Medicine

## 2016-12-17 ENCOUNTER — Ambulatory Visit (INDEPENDENT_AMBULATORY_CARE_PROVIDER_SITE_OTHER): Payer: Medicare Other | Admitting: Family Medicine

## 2016-12-17 ENCOUNTER — Ambulatory Visit (INDEPENDENT_AMBULATORY_CARE_PROVIDER_SITE_OTHER): Payer: Medicare Other

## 2016-12-17 VITALS — BP 128/60 | HR 67 | Temp 97.2°F | Resp 17 | Ht 68.0 in | Wt 263.8 lb

## 2016-12-17 VITALS — BP 128/60 | HR 108 | Temp 97.3°F | Ht 67.0 in | Wt 262.8 lb

## 2016-12-17 DIAGNOSIS — K219 Gastro-esophageal reflux disease without esophagitis: Secondary | ICD-10-CM

## 2016-12-17 DIAGNOSIS — E785 Hyperlipidemia, unspecified: Secondary | ICD-10-CM | POA: Diagnosis not present

## 2016-12-17 DIAGNOSIS — Z0000 Encounter for general adult medical examination without abnormal findings: Secondary | ICD-10-CM

## 2016-12-17 MED ORDER — LANSOPRAZOLE 30 MG PO CPDR: 30.0000 mg | DELAYED_RELEASE_CAPSULE | Freq: Every day | ORAL | 1 refills | Status: DC

## 2016-12-17 MED ORDER — PRAVASTATIN SODIUM 40 MG PO TABS: 40.0000 mg | ORAL_TABLET | Freq: Every day | ORAL | 1 refills | Status: DC

## 2016-12-17 NOTE — Progress Notes (Signed)
Subjective:   Eugene Murphy is a 83 y.o. male who presents for Medicare Annual/Subsequent preventive examination.  Review of Systems:  N/A  Cardiac Risk Factors include: advanced age (>58men, >65 women);dyslipidemia;hypertension;male gender;obesity (BMI >30kg/m2)     Objective:    Vitals: BP 128/60 (BP Location: Right Arm)   Pulse (!) 108   Temp 97.3 F (36.3 C) (Oral)   Ht 5\' 7"  (1.702 m)   Wt 262 lb 12.8 oz (119.2 kg)   BMI 41.16 kg/m   Body mass index is 41.16 kg/m.  Tobacco History  Smoking Status  . Never Smoker  Smokeless Tobacco  . Never Used     Counseling given: Not Answered   Past Medical History:  Diagnosis Date  . Asthma   . BPH (benign prostatic hyperplasia)    Followed by Bonna Gains.  Marland Kitchen GERD (gastroesophageal reflux disease)   . Hyperlipidemia   . Hypertension    Past Surgical History:  Procedure Laterality Date  . COSMETIC SURGERY Left    Left Arm Plastic Surgery in 1970s   Family History  Problem Relation Age of Onset  . Congestive Heart Failure Father   . Healthy Sister    History  Sexual Activity  . Sexual activity: Yes    Outpatient Encounter Prescriptions as of 12/17/2016  Medication Sig  . ASMANEX 30 METERED DOSES 220 MCG/INH inhaler   . Cholecalciferol (VITAMIN D-3) 5000 units TABS Take by mouth daily.  Marland Kitchen doxazosin (CARDURA) 8 MG tablet Take 1 tablet (8 mg total) by mouth daily.  Marland Kitchen dutasteride (AVODART) 0.5 MG capsule Take 1 capsule (0.5 mg total) by mouth daily. (Patient taking differently: Take 0.5 mg by mouth once a week. )  . enalapril (VASOTEC) 10 MG tablet Take 1 tablet (10 mg total) by mouth daily.  . fluticasone (FLONASE) 50 MCG/ACT nasal spray   . lansoprazole (PREVACID) 30 MG capsule Take 1 capsule (30 mg total) by mouth daily at 12 noon.  . latanoprost (XALATAN) 0.005 % ophthalmic solution   . levocetirizine (XYZAL) 5 MG tablet Take 1 tablet (5 mg total) by mouth every evening.  . Potassium 99 MG TABS Take 1 tablet  by mouth daily at 8 pm.  . pravastatin (PRAVACHOL) 40 MG tablet Take 1 tablet (40 mg total) by mouth daily.  Marland Kitchen PROAIR HFA 108 (90 BASE) MCG/ACT inhaler   . aspirin 81 MG chewable tablet Chew by mouth.  . fexofenadine (ALLEGRA) 180 MG tablet Take by mouth.  . timolol (TIMOPTIC) 0.5 % ophthalmic solution   . triamcinolone cream (KENALOG) 0.1 %    No facility-administered encounter medications on file as of 12/17/2016.     Activities of Daily Living In your present state of health, do you have any difficulty performing the following activities: 12/17/2016 06/07/2016  Hearing? N N  Vision? N Y  Difficulty concentrating or making decisions? Y N  Walking or climbing stairs? N N  Dressing or bathing? N N  Doing errands, shopping? N N  Preparing Food and eating ? N -  Using the Toilet? N -  In the past six months, have you accidently leaked urine? N -  Do you have problems with loss of bowel control? N -  Managing your Medications? N -  Managing your Finances? N -  Housekeeping or managing your Housekeeping? N -  Some recent data might be hidden    Patient Care Team: Roselee Nova, MD as PCP - General (Family Medicine) Erby Pian, MD  as Referring Physician (Specialist) Ronnell Freshwater, MD as Referring Physician (Ophthalmology) Teodoro Spray, MD as Consulting Physician (Cardiology) Abbie Sons, MD as Consulting Physician (Urology)   Assessment:     Exercise Activities and Dietary recommendations Current Exercise Habits: The patient does not participate in regular exercise at present (only yardwork currently), Exercise limited by: None identified  Goals    . Exercise          Pt to start back exercising/walking for 30 minutes 2-3 days a week.      Fall Risk Fall Risk  12/17/2016 06/07/2016 02/29/2016 10/20/2015 09/15/2015  Falls in the past year? No No No No No   Depression Screen PHQ 2/9 Scores 12/17/2016 06/07/2016 02/29/2016 10/20/2015  PHQ - 2 Score 0 0 0 0      Cognitive Function     6CIT Screen 12/17/2016  What Year? 0 points  What month? 0 points  What time? 0 points  Count back from 20 0 points  Months in reverse 4 points  Repeat phrase 2 points  Total Score 6    Immunization History  Administered Date(s) Administered  . Influenza, High Dose Seasonal PF 06/14/2015, 06/15/2016   Screening Tests Health Maintenance  Topic Date Due  . TETANUS/TDAP  06/04/2021  . INFLUENZA VACCINE  Completed  . PNA vac Low Risk Adult  Completed      Plan:  I have personally reviewed and addressed the Medicare Annual Wellness questionnaire and have noted the following in the patient's chart:  A. Medical and social history B. Use of alcohol, tobacco or illicit drugs  C. Current medications and supplements D. Functional ability and status E.  Nutritional status F.  Physical activity G. Advance directives H. List of other physicians I.  Hospitalizations, surgeries, and ER visits in previous 12 months J.  [place] such as hearing and vision if needed, cognitive and depression L. Referrals and appointments - none  In addition, I have reviewed and discussed with patient certain preventive protocols, quality metrics, and best practice recommendations. A written personalized care plan for preventive services as well as general preventive health recommendations were provided to patient.  See attached scanned questionnaire for additional information.   Signed,  Fabio Neighbors, LPN Nurse Health Advisor   MD Recommendations: None. I, as supervising physician, have reviewed the nurse health advisor's Medicare Wellness Visit note for this patient and concur with the findings and recommendations listed above.  Signed Syed Asad A. Manuella Ghazi MD Attending Physician.

## 2016-12-17 NOTE — Patient Instructions (Signed)

## 2016-12-17 NOTE — Progress Notes (Signed)
Name: Eugene Murphy   MRN: [id number]    DOB: Sep 12, 1932   Date:12/17/2016       Progress Note  Subjective  Chief Complaint  No chief complaint on file.   HPI  Pt. Presents for [employer] visit.  He is doing well.   Past Medical History:  Diagnosis Date  . Asthma   . BPH (benign prostatic hyperplasia)    Followed by Bonna Gains.  Marland Kitchen GERD (gastroesophageal reflux disease)   . Hyperlipidemia   . Hypertension     Past Surgical History:  Procedure Laterality Date  . COSMETIC SURGERY Left    Left Arm Plastic Surgery in 1970s    Family History  Problem Relation Age of Onset  . Congestive Heart Failure Father   . Healthy Sister     Social History   Social History  . Marital status: Married    Spouse name: N/A  . Number of children: N/A  . Years of education: N/A   Occupational History  . Not on file.   Social History Main Topics  . Smoking status: Never Smoker  . Smokeless tobacco: Never Used  . Alcohol use 0.0 oz/week     Comment: Occasional  . Drug use: No  . Sexual activity: Yes   Other Topics Concern  . Not on file   Social History Narrative  . No narrative on file     Current Outpatient Prescriptions:  .  ASMANEX 30 METERED DOSES 220 MCG/INH inhaler, , Disp: , Rfl:  .  aspirin 81 MG chewable tablet, Chew by mouth., Disp: , Rfl:  .  Cholecalciferol (VITAMIN D-3) 5000 units TABS, Take by mouth daily., Disp: , Rfl:  .  doxazosin (CARDURA) 8 MG tablet, Take 1 tablet (8 mg total) by mouth daily., Disp: 90 tablet, Rfl: 0 .  dutasteride (AVODART) 0.5 MG capsule, Take 1 capsule (0.5 mg total) by mouth daily. (Patient taking differently: Take 0.5 mg by mouth once a week. ), Disp: 90 capsule, Rfl: 0 .  enalapril (VASOTEC) 10 MG tablet, Take 1 tablet (10 mg total) by mouth daily., Disp: 90 tablet, Rfl: 1 .  fexofenadine (ALLEGRA) 180 MG tablet, Take by mouth., Disp: , Rfl:  .  fluticasone (FLONASE) 50 MCG/ACT nasal spray, , Disp: , Rfl:  .   lansoprazole (PREVACID) 30 MG capsule, Take 1 capsule (30 mg total) by mouth daily at 12 noon., Disp: 90 capsule, Rfl: 1 .  latanoprost (XALATAN) 0.005 % ophthalmic solution, , Disp: , Rfl:  .  levocetirizine (XYZAL) 5 MG tablet, Take 1 tablet (5 mg total) by mouth every evening., Disp: 90 tablet, Rfl: 1 .  Potassium 99 MG TABS, Take 1 tablet by mouth daily at 8 pm., Disp: , Rfl:  .  pravastatin (PRAVACHOL) 40 MG tablet, Take 1 tablet (40 mg total) by mouth daily., Disp: 90 tablet, Rfl: 1 .  PROAIR HFA 108 (90 BASE) MCG/ACT inhaler, , Disp: , Rfl:  .  timolol (TIMOPTIC) 0.5 % ophthalmic solution, , Disp: , Rfl:  .  triamcinolone cream (KENALOG) 0.1 %, , Disp: , Rfl:   Allergies  Allergen Reactions  . Shellfish Allergy Swelling    Throat swells     Review of Systems  Constitutional: Negative for chills, fever, malaise/fatigue and weight loss.  HENT: Positive for congestion (recent congestion.). Negative for sinus pain and sore throat.   Eyes: Negative for blurred vision and double vision.  Respiratory: Negative for cough and shortness of breath.   Cardiovascular:  Negative for chest pain and leg swelling.  Gastrointestinal: Negative for abdominal pain, blood in stool, nausea and vomiting.  Genitourinary: Negative for dysuria and hematuria.  Musculoskeletal: Positive for joint pain (pain in both knees). Negative for back pain and myalgias.  Neurological: Negative for dizziness.  Psychiatric/Behavioral: Negative for depression. The patient is not nervous/anxious and does not have insomnia.       Objective  There were no vitals filed for this visit.  Physical Exam  Constitutional: He is oriented to person, place, and time and well-developed, well-nourished, and in no distress.  HENT:  Head: Normocephalic and atraumatic.  Eyes: Pupils are equal, round, and reactive to light.  Cardiovascular: Normal rate, regular rhythm and normal heart sounds.   No murmur heard. Pulmonary/Chest:  Effort normal and breath sounds normal. He has no wheezes.  Abdominal: Soft. Bowel sounds are normal. There is no tenderness.  Musculoskeletal: He exhibits no edema.  Neurological: He is alert and oriented to person, place, and time.  Skin: Skin is warm and dry.  Psychiatric: Mood, memory, affect and judgment normal.  Nursing note and vitals reviewed.       Assessment & Plan  1. Annual physical exam Obtain age-appropriate laboratory screenings. - VITAMIN D 25 Hydroxy (Vit-D Deficiency, Fractures) - TSH  2. Dyslipidemia  - COMPLETE METABOLIC PANEL WITH GFR - Lipid panel - pravastatin (PRAVACHOL) 40 MG tablet; Take 1 tablet (40 mg total) by mouth daily.  Dispense: 90 tablet; Refill: 1  3. Gastroesophageal reflux disease, esophagitis presence not specified  - CBC with Differential/Platelet - lansoprazole (PREVACID) 30 MG capsule; Take 1 capsule (30 mg total) by mouth daily at 12 noon.  Dispense: 90 capsule; Refill: 1   [name] Asad [facility] 12/17/2016 10:48 AM

## 2016-12-19 LAB — CBC WITH DIFFERENTIAL/PLATELET
BASOS PCT: 0 %
Basophils Absolute: 0 cells/uL (ref 0–200)
EOS ABS: 477 {cells}/uL (ref 15–500)
Eosinophils Relative: 9 %
HEMATOCRIT: 38 % — AB (ref 38.5–50.0)
Hemoglobin: 12.4 g/dL — ABNORMAL LOW (ref 13.2–17.1)
LYMPHS ABS: 1961 {cells}/uL (ref 850–3900)
LYMPHS PCT: 37 %
MCH: 29.9 pg (ref 27.0–33.0)
MCHC: 32.6 g/dL (ref 32.0–36.0)
MCV: 91.6 fL (ref 80.0–100.0)
MONO ABS: 636 {cells}/uL (ref 200–950)
MPV: 10.8 fL (ref 7.5–12.5)
Monocytes Relative: 12 %
NEUTROS ABS: 2226 {cells}/uL (ref 1500–7800)
Neutrophils Relative %: 42 %
Platelets: 199 10*3/uL (ref 140–400)
RBC: 4.15 MIL/uL — AB (ref 4.20–5.80)
RDW: 13.3 % (ref 11.0–15.0)
WBC: 5.3 10*3/uL (ref 3.8–10.8)

## 2016-12-19 LAB — LIPID PANEL
Cholesterol: 122 mg/dL (ref <200)
HDL: 44 mg/dL (ref >40)
LDL CALC: 66 mg/dL (ref <100)
Total CHOL/HDL Ratio: 2.8 Ratio (ref <5.0)
Triglycerides: 58 mg/dL (ref <150)
VLDL: 12 mg/dL (ref <30)

## 2016-12-19 LAB — COMPLETE METABOLIC PANEL WITH GFR
ALT: 22 U/L (ref 9–46)
AST: 22 U/L (ref 10–35)
Albumin: 4 g/dL (ref 3.6–5.1)
Alkaline Phosphatase: 40 U/L (ref 40–115)
BUN: 13 mg/dL (ref 7–25)
CALCIUM: 9.4 mg/dL (ref 8.6–10.3)
CHLORIDE: 104 mmol/L (ref 98–110)
CO2: 27 mmol/L (ref 20–31)
CREATININE: 1.06 mg/dL (ref 0.70–1.11)
GFR, Est African American: 75 mL/min (ref >=60)
GFR, Est Non African American: 65 mL/min (ref >=60)
Glucose, Bld: 90 mg/dL (ref 65–99)
Potassium: 4.6 mmol/L (ref 3.5–5.3)
Sodium: 140 mmol/L (ref 135–146)
TOTAL PROTEIN: 7 g/dL (ref 6.1–8.1)
Total Bilirubin: 0.5 mg/dL (ref 0.2–1.2)

## 2016-12-19 LAB — TSH: TSH: 1.33 m[IU]/L (ref 0.40–4.50)

## 2016-12-20 LAB — VITAMIN D 25 HYDROXY (VIT D DEFICIENCY, FRACTURES): Vit D, 25-Hydroxy: 69 ng/mL (ref 30–100)

## 2016-12-31 ENCOUNTER — Other Ambulatory Visit: Payer: Self-pay | Admitting: Emergency Medicine

## 2016-12-31 DIAGNOSIS — E785 Hyperlipidemia, unspecified: Secondary | ICD-10-CM

## 2016-12-31 DIAGNOSIS — K219 Gastro-esophageal reflux disease without esophagitis: Secondary | ICD-10-CM

## 2016-12-31 MED ORDER — LANSOPRAZOLE 30 MG PO CPDR: 30.0000 mg | DELAYED_RELEASE_CAPSULE | Freq: Every day | ORAL | 1 refills | Status: DC

## 2016-12-31 MED ORDER — PRAVASTATIN SODIUM 40 MG PO TABS: 40.0000 mg | ORAL_TABLET | Freq: Every day | ORAL | 1 refills | Status: DC

## 2017-03-19 ENCOUNTER — Encounter: Payer: Self-pay | Admitting: Family Medicine

## 2017-03-19 ENCOUNTER — Ambulatory Visit (INDEPENDENT_AMBULATORY_CARE_PROVIDER_SITE_OTHER): Payer: Medicare Other | Admitting: Family Medicine

## 2017-03-19 DIAGNOSIS — E785 Hyperlipidemia, unspecified: Secondary | ICD-10-CM

## 2017-03-19 DIAGNOSIS — I10 Essential (primary) hypertension: Secondary | ICD-10-CM | POA: Diagnosis not present

## 2017-03-19 DIAGNOSIS — N400 Enlarged prostate without lower urinary tract symptoms: Secondary | ICD-10-CM | POA: Diagnosis not present

## 2017-03-19 MED ORDER — ENALAPRIL MALEATE 10 MG PO TABS: 10.0000 mg | ORAL_TABLET | Freq: Every day | ORAL | 1 refills | Status: DC

## 2017-03-19 MED ORDER — PRAVASTATIN SODIUM 40 MG PO TABS: 40.0000 mg | ORAL_TABLET | Freq: Every day | ORAL | 1 refills | Status: DC

## 2017-03-19 NOTE — Progress Notes (Signed)
Name: Eugene Murphy   MRN: [id number]    DOB: 1931-11-30   Date:03/19/2017       Progress Note  Subjective  Chief Complaint  Chief Complaint  Patient presents with  . Follow-up    patient is here for his 3 month f/u  . Hypertension    no complaints  . Hyperlipidemia    no complaints  . Benign Prostatic Hypertrophy    no complaints    Hypertension  This is a chronic problem. The problem is unchanged. The problem is controlled. Pertinent negatives include no blurred vision, chest pain, headaches, palpitations or shortness of breath. Past treatments include ACE inhibitors. There is no history of kidney disease, CAD/MI or CVA.  Hyperlipidemia  This is a chronic problem. The problem is controlled. Recent lipid tests were reviewed and are normal. Pertinent negatives include no chest pain, leg pain, myalgias or shortness of breath. Current antihyperlipidemic treatment includes statins.  Benign Prostatic Hypertrophy  This is a chronic problem. The problem is unchanged. Irritative symptoms include nocturia (twice especially if he drinks a lot of water). Obstructive symptoms do not include dribbling, a slower stream or straining. He is not sexually active. Past treatments include doxazosin and dutasteride. The treatment provided significant relief.     Past Medical History:  Diagnosis Date  . Asthma   . BPH (benign prostatic hyperplasia)    Followed by Bonna Gains.  Marland Kitchen GERD (gastroesophageal reflux disease)   . Hyperlipidemia   . Hypertension     Past Surgical History:  Procedure Laterality Date  . COSMETIC SURGERY Left    Left Arm Plastic Surgery in 1970s    Family History  Problem Relation Age of Onset  . Congestive Heart Failure Father   . Healthy Sister     Social History   Social History  . Marital status: Married    Spouse name: N/A  . Number of children: N/A  . Years of education: N/A   Occupational History  . Not on file.   Social History Main Topics  . Smoking  status: Never Smoker  . Smokeless tobacco: Never Used  . Alcohol use 0.0 oz/week     Comment: Occasional  . Drug use: No  . Sexual activity: Yes    Partners: Female   Other Topics Concern  . Not on file   Social History Narrative  . No narrative on file     Current Outpatient Prescriptions:  .  ASMANEX 30 METERED DOSES 220 MCG/INH inhaler, , Disp: , Rfl:  .  aspirin 81 MG chewable tablet, Chew by mouth., Disp: , Rfl:  .  Cholecalciferol (VITAMIN D-3) 5000 units TABS, Take by mouth daily., Disp: , Rfl:  .  doxazosin (CARDURA) 8 MG tablet, Take 1 tablet (8 mg total) by mouth daily., Disp: 90 tablet, Rfl: 0 .  dutasteride (AVODART) 0.5 MG capsule, Take 1 capsule (0.5 mg total) by mouth daily. (Patient taking differently: Take 0.5 mg by mouth once a week. ), Disp: 90 capsule, Rfl: 0 .  enalapril (VASOTEC) 10 MG tablet, Take 1 tablet (10 mg total) by mouth daily., Disp: 90 tablet, Rfl: 1 .  fexofenadine (ALLEGRA) 180 MG tablet, Take by mouth., Disp: , Rfl:  .  fluticasone (FLONASE) 50 MCG/ACT nasal spray, , Disp: , Rfl:  .  lansoprazole (PREVACID) 30 MG capsule, Take 1 capsule (30 mg total) by mouth daily at 12 noon., Disp: 90 capsule, Rfl: 1 .  latanoprost (XALATAN) 0.005 % ophthalmic solution, , Disp: ,  Rfl:  .  levocetirizine (XYZAL) 5 MG tablet, Take 1 tablet (5 mg total) by mouth every evening., Disp: 90 tablet, Rfl: 1 .  Potassium 99 MG TABS, Take 1 tablet by mouth daily at 8 pm., Disp: , Rfl:  .  pravastatin (PRAVACHOL) 40 MG tablet, Take 1 tablet (40 mg total) by mouth daily., Disp: 90 tablet, Rfl: 1 .  PROAIR HFA 108 (90 BASE) MCG/ACT inhaler, , Disp: , Rfl:  .  timolol (TIMOPTIC) 0.5 % ophthalmic solution, , Disp: , Rfl:  .  triamcinolone cream (KENALOG) 0.1 %, , Disp: , Rfl:   Allergies  Allergen Reactions  . Shellfish Allergy Swelling    Throat swells     Review of Systems  Eyes: Negative for blurred vision.  Respiratory: Negative for shortness of breath.    Cardiovascular: Negative for chest pain and palpitations.  Genitourinary: Positive for nocturia (twice especially if he drinks a lot of water).  Musculoskeletal: Negative for myalgias.  Neurological: Negative for headaches.    Objective  Vitals:   03/19/17 0916  BP: 126/64  Pulse: 94  Resp: 16  Temp: 98.1 F (36.7 C)  TempSrc: Oral  SpO2: 94%  Weight: 257 lb 9.6 oz (116.8 kg)  Height: 5\' 7"  (1.702 m)    Physical Exam  Constitutional: He is oriented to person, place, and time and well-developed, well-nourished, and in no distress.  HENT:  Head: Normocephalic and atraumatic.  Cardiovascular: Normal rate, regular rhythm and normal heart sounds.   No murmur heard. Pulmonary/Chest: Effort normal and breath sounds normal. No respiratory distress. He has no wheezes.  Abdominal: Soft. Bowel sounds are normal. There is no tenderness.  Musculoskeletal: He exhibits no edema.  Neurological: He is alert and oriented to person, place, and time.  Psychiatric: Mood, memory, affect and judgment normal.  Nursing note and vitals reviewed.     Assessment & Plan  1. Dyslipidemia FLP at goal, continue on statin - pravastatin (PRAVACHOL) 40 MG tablet; Take 1 tablet (40 mg total) by mouth daily.  Dispense: 90 tablet; Refill: 1  2. Essential hypertension  - enalapril (VASOTEC) 10 MG tablet; Take 1 tablet (10 mg total) by mouth daily.  Dispense: 90 tablet; Refill: 1  3. BPH without obstruction/lower urinary tract symptoms Followed by urology, continue on Avodart and doxazosin   Kahmari Herard Asad A. [facility] 03/19/2017 9:36 AM

## 2017-06-19 ENCOUNTER — Encounter: Payer: Self-pay | Admitting: Family Medicine

## 2017-06-19 ENCOUNTER — Ambulatory Visit (INDEPENDENT_AMBULATORY_CARE_PROVIDER_SITE_OTHER): Payer: Medicare Other | Admitting: Family Medicine

## 2017-06-19 VITALS — BP 136/82 | HR 86 | Temp 98.2°F | Resp 14 | Ht 67.0 in | Wt 249.4 lb

## 2017-06-19 DIAGNOSIS — K219 Gastro-esophageal reflux disease without esophagitis: Secondary | ICD-10-CM | POA: Diagnosis not present

## 2017-06-19 DIAGNOSIS — I10 Essential (primary) hypertension: Secondary | ICD-10-CM

## 2017-06-19 DIAGNOSIS — Z23 Encounter for immunization: Secondary | ICD-10-CM

## 2017-06-19 DIAGNOSIS — E7800 Pure hypercholesterolemia, unspecified: Secondary | ICD-10-CM | POA: Diagnosis not present

## 2017-06-19 MED ORDER — LANSOPRAZOLE 15 MG PO CPDR: 15.0000 mg | DELAYED_RELEASE_CAPSULE | Freq: Every day | ORAL | 0 refills | Status: DC

## 2017-06-19 NOTE — Progress Notes (Signed)
Name: Eugene Murphy   MRN: [id number]    DOB: August 01, 1932   Date:06/19/2017       Progress Note  Subjective  Chief Complaint  Chief Complaint  Patient presents with  . Follow-up    3 mo  . Gastroesophageal Reflux  . Hypertension    Hypertension  This is a chronic problem. The problem is unchanged. The problem is controlled. Pertinent negatives include no blurred vision, chest pain, headaches, palpitations or shortness of breath. Past treatments include ACE inhibitors. There is no history of kidney disease, CAD/MI or CVA.  Hyperlipidemia  This is a chronic problem. The problem is controlled. Recent lipid tests were reviewed and are normal. Pertinent negatives include no chest pain, leg pain, myalgias or shortness of breath. Current antihyperlipidemic treatment includes statins.  Gastroesophageal Reflux  He reports no belching, no chest pain, no coughing, no heartburn, no nausea or no sore throat. This is a chronic problem. The problem has been unchanged. He has tried a PPI (Pharmacy recommedning to lower the dose of Lansoprazole to 15 mg daily, patient has been on 30 mg for many years) for the symptoms. Past procedures do not include an EGD or esophageal manometry.     Past Medical History:  Diagnosis Date  . Asthma   . BPH (benign prostatic hyperplasia)    Followed by Bonna Gains.  Marland Kitchen GERD (gastroesophageal reflux disease)   . Hyperlipidemia   . Hypertension     Past Surgical History:  Procedure Laterality Date  . COSMETIC SURGERY Left    Left Arm Plastic Surgery in 1970s    Family History  Problem Relation Age of Onset  . Congestive Heart Failure Father   . Healthy Sister     Social History   Social History  . Marital status: Married    Spouse name: N/A  . Number of children: N/A  . Years of education: N/A   Occupational History  . Not on file.   Social History Main Topics  . Smoking status: Never Smoker  . Smokeless tobacco: Never Used  . Alcohol use 0.0  oz/week     Comment: Occasional  . Drug use: No  . Sexual activity: Yes    Partners: Female   Other Topics Concern  . Not on file   Social History Narrative  . No narrative on file     Current Outpatient Prescriptions:  .  ASMANEX 30 METERED DOSES 220 MCG/INH inhaler, , Disp: , Rfl:  .  aspirin 81 MG chewable tablet, Chew by mouth., Disp: , Rfl:  .  Cholecalciferol (VITAMIN D-3) 5000 units TABS, Take by mouth daily., Disp: , Rfl:  .  doxazosin (CARDURA) 8 MG tablet, Take 1 tablet (8 mg total) by mouth daily., Disp: 90 tablet, Rfl: 0 .  dutasteride (AVODART) 0.5 MG capsule, Take 1 capsule (0.5 mg total) by mouth daily. (Patient taking differently: Take 0.5 mg by mouth once a week. ), Disp: 90 capsule, Rfl: 0 .  enalapril (VASOTEC) 10 MG tablet, Take 1 tablet (10 mg total) by mouth daily., Disp: 90 tablet, Rfl: 1 .  fexofenadine (ALLEGRA) 180 MG tablet, Take by mouth., Disp: , Rfl:  .  fluticasone (FLONASE) 50 MCG/ACT nasal spray, , Disp: , Rfl:  .  lansoprazole (PREVACID) 30 MG capsule, Take 1 capsule (30 mg total) by mouth daily at 12 noon., Disp: 90 capsule, Rfl: 1 .  latanoprost (XALATAN) 0.005 % ophthalmic solution, , Disp: , Rfl:  .  levocetirizine (XYZAL) 5 MG tablet,  Take 1 tablet (5 mg total) by mouth every evening., Disp: 90 tablet, Rfl: 1 .  Potassium 99 MG TABS, Take 1 tablet by mouth daily at 8 pm., Disp: , Rfl:  .  pravastatin (PRAVACHOL) 40 MG tablet, Take 1 tablet (40 mg total) by mouth daily., Disp: 90 tablet, Rfl: 1 .  PROAIR HFA 108 (90 BASE) MCG/ACT inhaler, , Disp: , Rfl:  .  timolol (TIMOPTIC) 0.5 % ophthalmic solution, , Disp: , Rfl:  .  triamcinolone cream (KENALOG) 0.1 %, , Disp: , Rfl:   Allergies  Allergen Reactions  . Shellfish Allergy Swelling    Throat swells     Review of Systems  HENT: Negative for sore throat.   Eyes: Negative for blurred vision.  Respiratory: Negative for cough and shortness of breath.   Cardiovascular: Negative for chest  pain and palpitations.  Gastrointestinal: Negative for heartburn and nausea.  Musculoskeletal: Negative for myalgias.  Neurological: Negative for headaches.    Objective  Vitals:   06/19/17 0831  BP: 136/82  Pulse: 86  Resp: 14  Temp: 98.2 F (36.8 C)  TempSrc: Oral  SpO2: 97%  Weight: 249 lb 6.4 oz (113.1 kg)  Height: 5\' 7"  (1.702 m)    Physical Exam  Constitutional: He is oriented to person, place, and time and well-developed, well-nourished, and in no distress.  HENT:  Head: Normocephalic and atraumatic.  Cardiovascular: Normal rate, regular rhythm and normal heart sounds.   No murmur heard. Pulmonary/Chest: Effort normal and breath sounds normal. He has no wheezes.  Abdominal: Soft. Bowel sounds are normal. There is no tenderness.  Musculoskeletal: He exhibits no edema.  Neurological: He is alert and oriented to person, place, and time.  Psychiatric: Mood, memory, affect and judgment normal.  Nursing note and vitals reviewed.     Assessment & Plan  1. Gastroesophageal reflux disease, esophagitis presence not specified After reviewing risks, benefits, and alternatives, we will change lansoprazole to 15 mg daily, patient will continue to monitor for recurrence of GER symptoms, follow-up in one month - lansoprazole (PREVACID) 15 MG capsule; Take 1 capsule (15 mg total) by mouth daily at 12 noon.  Dispense: 90 capsule; Refill: 0  2. Essential hypertension BP stable on present antihypertensive treatment  3. Pure hypercholesterolemia FLP reviewed from March 2018, it was at goal  4. Need for influenza vaccination  - Flu vaccine HIGH DOSE PF (Fluzone High dose)  [name] Asad A. [facility] 06/19/2017 8:47 AM

## 2017-09-23 ENCOUNTER — Ambulatory Visit: Payer: Medicare Other | Admitting: Family Medicine

## 2017-10-02 ENCOUNTER — Ambulatory Visit: Payer: Medicare Other | Admitting: Family Medicine

## 2017-10-02 ENCOUNTER — Encounter: Payer: Self-pay | Admitting: Family Medicine

## 2017-10-02 VITALS — BP 126/68 | HR 97 | Temp 98.3°F | Resp 16 | Ht 67.0 in | Wt 250.0 lb

## 2017-10-02 DIAGNOSIS — E785 Hyperlipidemia, unspecified: Secondary | ICD-10-CM | POA: Diagnosis not present

## 2017-10-02 DIAGNOSIS — I10 Essential (primary) hypertension: Secondary | ICD-10-CM

## 2017-10-02 DIAGNOSIS — K219 Gastro-esophageal reflux disease without esophagitis: Secondary | ICD-10-CM | POA: Diagnosis not present

## 2017-10-02 MED ORDER — ENALAPRIL MALEATE 10 MG PO TABS: 10.0000 mg | ORAL_TABLET | Freq: Every day | ORAL | 1 refills | Status: DC

## 2017-10-02 MED ORDER — LANSOPRAZOLE 30 MG PO CPDR: 30.0000 mg | DELAYED_RELEASE_CAPSULE | Freq: Every day | ORAL | 0 refills | Status: DC

## 2017-10-02 MED ORDER — PRAVASTATIN SODIUM 40 MG PO TABS: 40.0000 mg | ORAL_TABLET | Freq: Every day | ORAL | 1 refills | Status: DC

## 2017-10-02 NOTE — Progress Notes (Signed)
Name: Eugene Murphy   MRN: [id number]    DOB: Sep 20, 1932   Date:10/02/2017       Progress Note  Subjective  Chief Complaint  Chief Complaint  Patient presents with  . Medication Refill    3 month F/U  . Hypertension    Denies any symptoms  . Hyperlipidemia  . Gastroesophageal Reflux    Doing well with medication- taking 30 mg-the 15mg  was not controlling his symptoms    Hypertension  This is a chronic problem. The problem is unchanged. The problem is controlled. Pertinent negatives include no blurred vision, chest pain, headaches, palpitations or shortness of breath. Past treatments include ACE inhibitors and alpha 1 blockers. There is no history of kidney disease, CAD/MI or CVA.  Hyperlipidemia  This is a chronic problem. The problem is controlled. Recent lipid tests were reviewed and are normal. Pertinent negatives include no chest pain, myalgias or shortness of breath. Current antihyperlipidemic treatment includes statins.  Gastroesophageal Reflux  He reports no abdominal pain, no belching, no chest pain, no heartburn, no hoarse voice, no nausea or no tooth decay. This is a chronic (decreased Prevacid to 15 mg daily, his reflux symptoms recurred and he started taking Prevacid 30 mg which is fine) problem. The problem has been unchanged. The symptoms are aggravated by certain foods. He has tried a PPI for the symptoms.     Past Medical History:  Diagnosis Date  . Asthma   . BPH (benign prostatic hyperplasia)    Followed by Bonna Gains.  Marland Kitchen GERD (gastroesophageal reflux disease)   . Hyperlipidemia   . Hypertension     Past Surgical History:  Procedure Laterality Date  . COSMETIC SURGERY Left    Left Arm Plastic Surgery in 1970s    Family History  Problem Relation Age of Onset  . Congestive Heart Failure Father   . Healthy Sister     Social History   Socioeconomic History  . Marital status: Married    Spouse name: Not on file  . Number of children: Not on file  .  Years of education: Not on file  . Highest education level: Not on file  Social Needs  . Financial resource strain: Not on file  . Food insecurity - worry: Not on file  . Food insecurity - inability: Not on file  . Transportation needs - medical: Not on file  . Transportation needs - non-medical: Not on file  Occupational History  . Not on file  Tobacco Use  . Smoking status: Never Smoker  . Smokeless tobacco: Never Used  Substance and Sexual Activity  . Alcohol use: Yes    Alcohol/week: 0.0 oz    Comment: Occasional  . Drug use: No  . Sexual activity: Yes    Partners: Female  Other Topics Concern  . Not on file  Social History Narrative  . Not on file     Current Outpatient Medications:  .  Albuterol Sulfate 108 (90 Base) MCG/ACT AEPB, , Disp: , Rfl:  .  Ascorbic Acid (VITAMIN C) 1000 MG tablet, Take by mouth., Disp: , Rfl:  .  ASMANEX 30 METERED DOSES 220 MCG/INH inhaler, , Disp: , Rfl:  .  aspirin 81 MG chewable tablet, Chew by mouth., Disp: , Rfl:  .  azelastine (ASTELIN) 0.1 % nasal spray, Place into the nose., Disp: , Rfl:  .  Cholecalciferol (VITAMIN D-3) 5000 units TABS, Take by mouth daily., Disp: , Rfl:  .  doxazosin (CARDURA) 8 MG tablet, Take  1 tablet (8 mg total) by mouth daily., Disp: 90 tablet, Rfl: 0 .  dutasteride (AVODART) 0.5 MG capsule, Take 1 capsule (0.5 mg total) by mouth daily. (Patient taking differently: Take 0.5 mg by mouth once a week. ), Disp: 90 capsule, Rfl: 0 .  enalapril (VASOTEC) 10 MG tablet, Take 1 tablet (10 mg total) by mouth daily., Disp: 90 tablet, Rfl: 1 .  fexofenadine (ALLEGRA) 180 MG tablet, Take by mouth., Disp: , Rfl:  .  fluticasone (FLONASE) 50 MCG/ACT nasal spray, , Disp: , Rfl:  .  lansoprazole (PREVACID) 30 MG capsule, Take by mouth., Disp: , Rfl:  .  latanoprost (XALATAN) 0.005 % ophthalmic solution, , Disp: , Rfl:  .  levocetirizine (XYZAL) 5 MG tablet, Take 1 tablet (5 mg total) by mouth every evening., Disp: 90 tablet,  Rfl: 1 .  meloxicam (MOBIC) 7.5 MG tablet, Take by mouth., Disp: , Rfl:  .  Potassium 99 MG TABS, Take 1 tablet by mouth daily at 8 pm., Disp: , Rfl:  .  pravastatin (PRAVACHOL) 40 MG tablet, Take 1 tablet (40 mg total) by mouth daily., Disp: 90 tablet, Rfl: 1 .  PROAIR HFA 108 (90 BASE) MCG/ACT inhaler, , Disp: , Rfl:  .  timolol (TIMOPTIC) 0.5 % ophthalmic solution, , Disp: , Rfl:  .  triamcinolone cream (KENALOG) 0.1 %, , Disp: , Rfl:   Allergies  Allergen Reactions  . Shellfish Allergy Swelling    Throat swells     Review of Systems  HENT: Negative for hoarse voice.   Eyes: Negative for blurred vision.  Respiratory: Negative for shortness of breath.   Cardiovascular: Negative for chest pain and palpitations.  Gastrointestinal: Negative for abdominal pain, heartburn and nausea.  Musculoskeletal: Negative for myalgias.  Neurological: Negative for headaches.     Objective  Vitals:   10/02/17 1002  BP: 126/68  Pulse: 97  Resp: 16  Temp: 98.3 F (36.8 C)  TempSrc: Oral  SpO2: 99%  Weight: 250 lb (113.4 kg)  Height: 5\' 7"  (1.702 m)    Physical Exam  Constitutional: He is oriented to person, place, and time and well-developed, well-nourished, and in no distress.  HENT:  Head: Normocephalic and atraumatic.  Cardiovascular: Normal rate, regular rhythm and normal heart sounds.  No murmur heard. Pulmonary/Chest: Effort normal and breath sounds normal. He has no wheezes.  Abdominal: Soft. Bowel sounds are normal. There is no tenderness.  Musculoskeletal: He exhibits no edema.  Neurological: He is alert and oriented to person, place, and time.  Psychiatric: Mood, memory, affect and judgment normal.  Nursing note and vitals reviewed.     Assessment & Plan  1. Dyslipidemia On statin, FLP reviewed - pravastatin (PRAVACHOL) 40 MG tablet; Take 1 tablet (40 mg total) by mouth daily.  Dispense: 90 tablet; Refill: 1  2. Essential hypertension BP stable on present anti-  hypertensive treatment - enalapril (VASOTEC) 10 MG tablet; Take 1 tablet (10 mg total) by mouth daily.  Dispense: 90 tablet; Refill: 1  3. Gastroesophageal reflux disease, esophagitis presence not specified Now back on Prevacid 30, refills provided - lansoprazole (PREVACID) 30 MG capsule; Take 1 capsule (30 mg total) by mouth daily.  Dispense: 90 capsule; Refill: 0   Shaman Muscarella Asad A. [facility] 10/02/2017 10:31 AM

## 2017-11-18 ENCOUNTER — Encounter: Payer: Self-pay | Admitting: Urology

## 2017-11-18 ENCOUNTER — Ambulatory Visit: Payer: Medicare Other | Admitting: Urology

## 2017-11-18 VITALS — BP 158/72 | HR 93 | Ht 69.0 in | Wt 244.0 lb

## 2017-11-18 DIAGNOSIS — N400 Enlarged prostate without lower urinary tract symptoms: Secondary | ICD-10-CM | POA: Diagnosis not present

## 2017-11-18 DIAGNOSIS — N401 Enlarged prostate with lower urinary tract symptoms: Secondary | ICD-10-CM

## 2017-11-18 DIAGNOSIS — N138 Other obstructive and reflux uropathy: Secondary | ICD-10-CM | POA: Diagnosis not present

## 2017-11-18 NOTE — Progress Notes (Signed)
11/18/2017 10:01 AM   Eugene Murphy Feb 25, 1932 [id number]  Referring provider: Roselee Nova, MD [address] [place], [place] [address]  Chief Complaint  Patient presents with  . Benign Prostatic Hypertrophy    42month    HPI: 84 year old male presents for annual follow-up of BPH. I last saw him at [facility] on 11/19/2016.  He is on doxazosin and dutasteride.  He takes dutasteride once weekly.  He has stable lower urinary tract symptoms.  He has noted slight increased urinary frequency since his last visit but states this is not bothersome.  He denies dysuria or gross hematuria.  He denies flank, abdominal, pelvic or scrotal pain.   PMH: Past Medical History:  Diagnosis Date  . Asthma   . BPH (benign prostatic hyperplasia)    Followed by Bonna Gains.  Marland Kitchen GERD (gastroesophageal reflux disease)   . Hyperlipidemia   . Hypertension     Surgical History: Past Surgical History:  Procedure Laterality Date  . COSMETIC SURGERY Left    Left Arm Plastic Surgery in 1970s    Home Medications:  Allergies as of 11/18/2017      Reactions   Shellfish Allergy Swelling   Throat swells      Medication List        Accurate as of 11/18/17 10:01 AM. Always use your most recent med list.          ASMANEX 30 METERED DOSES 220 MCG/INH inhaler Generic drug:  mometasone   aspirin 81 MG chewable tablet Chew by mouth.   azelastine 0.1 % nasal spray Commonly known as:  ASTELIN Place into the nose.   doxazosin 8 MG tablet Commonly known as:  CARDURA Take 1 tablet (8 mg total) by mouth daily.   dutasteride 0.5 MG capsule Commonly known as:  AVODART Take 1 capsule (0.5 mg total) by mouth daily.   enalapril 10 MG tablet Commonly known as:  VASOTEC Take 1 tablet (10 mg total) by mouth daily.   fexofenadine 180 MG tablet Commonly known as:  ALLEGRA Take by mouth.   fluticasone 50 MCG/ACT nasal spray Commonly known as:  FLONASE   lansoprazole 30 MG  capsule Commonly known as:  PREVACID Take 1 capsule (30 mg total) by mouth daily.   latanoprost 0.005 % ophthalmic solution Commonly known as:  XALATAN   levocetirizine 5 MG tablet Commonly known as:  XYZAL Take 1 tablet (5 mg total) by mouth every evening.   Potassium 99 MG Tabs Take 1 tablet by mouth daily at 8 pm.   pravastatin 40 MG tablet Commonly known as:  PRAVACHOL Take 1 tablet (40 mg total) by mouth daily.   PROAIR HFA 108 (90 Base) MCG/ACT inhaler Generic drug:  albuterol   Albuterol Sulfate 108 (90 Base) MCG/ACT Aepb   timolol 0.5 % ophthalmic solution Commonly known as:  TIMOPTIC   triamcinolone cream 0.1 % Commonly known as:  KENALOG   vitamin C 1000 MG tablet Take by mouth.   Vitamin D-3 5000 units Tabs Take by mouth daily.       Allergies:  Allergies  Allergen Reactions  . Shellfish Allergy Swelling    Throat swells    Family History: Family History  Problem Relation Age of Onset  . Congestive Heart Failure Father   . Healthy Sister     Social History:  reports that  has never smoked. he has never used smokeless tobacco. He reports that he drinks alcohol. He reports that he does not  use drugs.  ROS: UROLOGY Frequent Urination?: No Hard to postpone urination?: No Burning/pain with urination?: No Get up at night to urinate?: Yes Leakage of urine?: No Urine stream starts and stops?: No Trouble starting stream?: No Do you have to strain to urinate?: No Blood in urine?: No Urinary tract infection?: No Sexually transmitted disease?: No Injury to kidneys or bladder?: No Painful intercourse?: No Weak stream?: No Erection problems?: No Penile pain?: No  Gastrointestinal Nausea?: No Vomiting?: No Indigestion/heartburn?: No Diarrhea?: No Constipation?: No  Constitutional Fever: No Night sweats?: No Weight loss?: No Fatigue?: No  Skin Skin rash/lesions?: No Itching?: No  Eyes Blurred vision?: No Double vision?:  No  Ears/Nose/Throat Sore throat?: No Sinus problems?: No  Hematologic/Lymphatic Swollen glands?: No Easy bruising?: No  Cardiovascular Leg swelling?: No Chest pain?: No  Respiratory Cough?: No Shortness of breath?: No  Endocrine Excessive thirst?: No  Musculoskeletal Back pain?: No Joint pain?: No  Neurological Headaches?: No Dizziness?: No  Psychologic Depression?: No Anxiety?: No  Physical Exam: BP (!) 158/72   Pulse 93   Ht 5\' 9"  (1.753 m)   Wt 244 lb (110.7 kg)   BMI 36.03 kg/m   Constitutional:  Alert and oriented, No acute distress. HEENT: [place] AT, moist mucus membranes.  Trachea midline, no masses. Cardiovascular: No clubbing, cyanosis, or edema. Respiratory: Normal respiratory effort, no increased work of breathing. GI: Abdomen is soft, nontender, nondistended, no abdominal masses GU: No CVA tenderness. Skin: No rashes, bruises or suspicious lesions. Lymph: No cervical or inguinal adenopathy. Neurologic: Grossly intact, no focal deficits, moving all 4 extremities. Psychiatric: Normal mood and affect.  Laboratory Data: Lab Results  Component Value Date   WBC 5.3 12/17/2016   HGB 12.4 (L) 12/17/2016   HCT 38.0 (L) 12/17/2016   MCV 91.6 12/17/2016   PLT 199 12/17/2016    Lab Results  Component Value Date   CREATININE 1.06 12/17/2016    Lab Results  Component Value Date   HGBA1C 5.5 02/29/2016     Assessment & Plan:    1. BPH with obstruction/lower urinary tract symptoms Stable lower urinary tract symptoms on doxazosin and dutasteride which were refilled.  Continue annual follow-up.   Return in about 1 year (around 11/18/2018) for Recheck.   Abbie Sons, [place] [address], [place], [place] [address] [phone]

## 2017-11-19 ENCOUNTER — Encounter: Payer: Self-pay | Admitting: Urology

## 2017-11-19 MED ORDER — DOXAZOSIN MESYLATE 8 MG PO TABS: 8.0000 mg | ORAL_TABLET | Freq: Every day | ORAL | 3 refills | Status: DC

## 2017-11-19 MED ORDER — DUTASTERIDE 0.5 MG PO CAPS: 0.5000 mg | ORAL_CAPSULE | Freq: Every day | ORAL | 3 refills | Status: DC

## 2017-12-31 ENCOUNTER — Encounter: Payer: Self-pay | Admitting: Family Medicine

## 2017-12-31 ENCOUNTER — Ambulatory Visit: Payer: Medicare Other | Admitting: Family Medicine

## 2017-12-31 VITALS — BP 136/78 | HR 75 | Temp 98.8°F | Resp 18 | Ht 69.0 in | Wt 248.0 lb

## 2017-12-31 DIAGNOSIS — I10 Essential (primary) hypertension: Secondary | ICD-10-CM

## 2017-12-31 DIAGNOSIS — K219 Gastro-esophageal reflux disease without esophagitis: Secondary | ICD-10-CM

## 2017-12-31 DIAGNOSIS — G4733 Obstructive sleep apnea (adult) (pediatric): Secondary | ICD-10-CM

## 2017-12-31 DIAGNOSIS — Z9989 Dependence on other enabling machines and devices: Secondary | ICD-10-CM | POA: Diagnosis not present

## 2017-12-31 DIAGNOSIS — E785 Hyperlipidemia, unspecified: Secondary | ICD-10-CM

## 2017-12-31 DIAGNOSIS — R739 Hyperglycemia, unspecified: Secondary | ICD-10-CM | POA: Diagnosis not present

## 2017-12-31 DIAGNOSIS — M170 Bilateral primary osteoarthritis of knee: Secondary | ICD-10-CM

## 2017-12-31 DIAGNOSIS — D649 Anemia, unspecified: Secondary | ICD-10-CM

## 2017-12-31 MED ORDER — LANSOPRAZOLE 30 MG PO CPDR: 30.0000 mg | DELAYED_RELEASE_CAPSULE | Freq: Every day | ORAL | 1 refills | Status: DC

## 2017-12-31 MED ORDER — ENALAPRIL MALEATE 10 MG PO TABS: 10.0000 mg | ORAL_TABLET | Freq: Every day | ORAL | 1 refills | Status: DC

## 2017-12-31 NOTE — Progress Notes (Signed)
Name: Eugene Murphy   MRN: [id number]    DOB: 1932/02/22   Date:12/31/2017       Progress Note  Subjective  Chief Complaint  Chief Complaint  Patient presents with  . Medication Refill  . Hypertension    Denies any symptoms  . Hyperlipidemia  . Gastroesophageal Reflux    HPI  HTN: he is taking medication, bp is at goal and no side effects of medication. Denies chest pain or decrease in exercise tolerance  Hyperlipidemia: taking Pravastatin, due for repeat labs, no myalgias.   OSA: he is on CPAP machine, he denies waking up on headache, no snoring as long as he wears CPAP machine  GERD: doing well on PPI, discussed long risk use of PPI, but had recurrence of symptoms and had to resume medication.   Asthma: seeing Dr. Raul Del, no cough, wheezing or SOB, doing well with prn use at this time.   Obesity: discussed life style modification, portion control and exercising to decrease BMI   Patient Active Problem List   Diagnosis Date Noted  . OSA on CPAP 12/31/2017  . GERD (gastroesophageal reflux disease) 07/25/2016  . Environmental and seasonal allergies 02/29/2016  . Elevated PSA 11/02/2015  . Hyperlipidemia 04/13/2015  . Hypertension 04/06/2015  . BPH with obstruction/lower urinary tract symptoms 04/06/2015  . Hypersomnia with sleep apnea 12/21/2013  . Asthma, mild intermittent 12/21/2013    Past Surgical History:  Procedure Laterality Date  . COSMETIC SURGERY Left    Left Arm Plastic Surgery in 1970s    Family History  Problem Relation Age of Onset  . Congestive Heart Failure Father   . Healthy Sister   . Dementia Brother     Social History   Socioeconomic History  . Marital status: Married    Spouse name: Flora  . Number of children: 3  . Years of education: Not on file  . Highest education level: 9th grade  Occupational History  . Occupation: cab drivers    Comment: retired many years ago   Social Needs  . Financial resource strain: Not hard at all   . Food insecurity:    Worry: Never true    Inability: Never true  . Transportation needs:    Medical: No    Non-medical: Not on file  Tobacco Use  . Smoking status: Never Smoker  . Smokeless tobacco: Never Used  Substance and Sexual Activity  . Alcohol use: Yes    Alcohol/week: 0.0 oz    Comment: Occasional  . Drug use: No  . Sexual activity: Yes    Partners: Female  Lifestyle  . Physical activity:    Days per week: 0 days    Minutes per session: 0 min  . Stress: Not at all  Relationships  . Social connections:    Talks on phone: Once a week    Gets together: More than three times a week    Attends religious service: More than 4 times per year    Active member of club or organization: No    Attends meetings of clubs or organizations: Never    Relationship status: Married  . Intimate partner violence:    Fear of current or ex partner: No    Emotionally abused: No    Physically abused: No    Forced sexual activity: No  Other Topics Concern  . Not on file  Social History Narrative   He had 3 children prior to getting married, one died in a motorcycle accident  He has two grown children in [place] and one in [place] here from [place] after retirement      Current Outpatient Medications:  .  Albuterol Sulfate 108 (90 Base) MCG/ACT AEPB, , Disp: , Rfl:  .  Ascorbic Acid (VITAMIN C) 1000 MG tablet, Take by mouth., Disp: , Rfl:  .  ASMANEX 30 METERED DOSES 220 MCG/INH inhaler, , Disp: , Rfl:  .  aspirin 81 MG chewable tablet, Chew by mouth., Disp: , Rfl:  .  azelastine (ASTELIN) 0.1 % nasal spray, Place into the nose., Disp: , Rfl:  .  Cholecalciferol (VITAMIN D-3) 5000 units TABS, Take by mouth daily., Disp: , Rfl:  .  doxazosin (CARDURA) 8 MG tablet, Take 1 tablet (8 mg total) by mouth daily., Disp: 90 tablet, Rfl: 3 .  dutasteride (AVODART) 0.5 MG capsule, Take 1 capsule (0.5 mg total) by mouth daily., Disp: 90 capsule, Rfl: 3 .  enalapril (VASOTEC) 10 MG  tablet, Take 1 tablet (10 mg total) by mouth daily., Disp: 90 tablet, Rfl: 1 .  fluticasone (FLONASE) 50 MCG/ACT nasal spray, , Disp: , Rfl:  .  lansoprazole (PREVACID) 30 MG capsule, Take 1 capsule (30 mg total) by mouth daily., Disp: 90 capsule, Rfl: 0 .  latanoprost (XALATAN) 0.005 % ophthalmic solution, , Disp: , Rfl:  .  levocetirizine (XYZAL) 5 MG tablet, Take 1 tablet (5 mg total) by mouth every evening., Disp: 90 tablet, Rfl: 1 .  meloxicam (MOBIC) 7.5 MG tablet, Take 7.5 mg by mouth daily., Disp: , Rfl: 1 .  Potassium 99 MG TABS, Take 1 tablet by mouth daily at 8 pm., Disp: , Rfl:  .  pravastatin (PRAVACHOL) 40 MG tablet, Take 1 tablet (40 mg total) by mouth daily., Disp: 90 tablet, Rfl: 1 .  timolol (TIMOPTIC) 0.5 % ophthalmic solution, , Disp: , Rfl:  .  triamcinolone cream (KENALOG) 0.1 %, , Disp: , Rfl:   Allergies  Allergen Reactions  . Shellfish Allergy Swelling    Throat swells     ROS  Constitutional: Negative for fever or weight change.  Respiratory: Negative for cough and shortness of breath.   Cardiovascular: Negative for chest pain or palpitations.  Gastrointestinal: Negative for abdominal pain, no bowel changes.  Musculoskeletal: Positive for gait problem and intermittent  joint swelling.  Skin: Negative for rash.  Neurological: Negative for dizziness or headache.  No other specific complaints in a complete review of systems (except as listed in HPI above).  Objective  Vitals:   12/31/17 0847  BP: 136/78  Pulse: 75  Resp: 18  Temp: 98.8 F (37.1 C)  TempSrc: Oral  SpO2: 98%  Weight: 248 lb (112.5 kg)  Height: 5\' 9"  (1.753 m)    Body mass index is 36.62 kg/m.  Physical Exam  Constitutional: Patient appears well-developed and well-nourished. Obese  No distress.  HEENT: head atraumatic, normocephalic, pupils equal and reactive to light,  neck supple, throat within normal limits Cardiovascular: Normal rate, regular rhythm and normal heart sounds.   No murmur heard. No BLE edema. Pulmonary/Chest: Effort normal and breath sounds normal. No respiratory distress. Abdominal: Soft.  There is no tenderness. Psychiatric: Patient has a normal mood and affect. behavior is normal. Judgment and thought content normal.  PHQ2/9: Depression screen [facility] 2/9 12/31/2017 10/02/2017 06/19/2017 03/19/2017 12/17/2016  Decreased Interest 0 0 0 0 0  Down, Depressed, Hopeless 0 0 0 0 0  PHQ - 2 Score 0 0 0 0 0  Fall Risk: Fall Risk  12/31/2017 10/02/2017 06/19/2017 03/19/2017 12/17/2016  Falls in the past year? No No No No No     Functional Status Survey: Is the patient deaf or have difficulty hearing?: No Does the patient have difficulty seeing, even when wearing glasses/contacts?: No Does the patient have difficulty concentrating, remembering, or making decisions?: No Does the patient have difficulty walking or climbing stairs?: Yes(Climbing steps with his knees) Does the patient have difficulty dressing or bathing?: No Does the patient have difficulty doing errands alone such as visiting a doctor's office or shopping?: No    Assessment & Plan  1. Essential hypertension  - COMPLETE METABOLIC PANEL WITH GFR - enalapril (VASOTEC) 10 MG tablet; Take 1 tablet (10 mg total) by mouth daily.  Dispense: 90 tablet; Refill: 1  2. OSA on CPAP  Continue CPAP use   3. Dyslipidemia  - Lipid panel  4. Class 2 severe obesity with serious comorbidity in adult, unspecified BMI, unspecified obesity type ([place])  Discussed increase in activity   5. Gastroesophageal reflux disease, esophagitis presence not specified  - lansoprazole (PREVACID) 30 MG capsule; Take 1 capsule (30 mg total) by mouth daily.  Dispense: 90 capsule; Refill: 1  6. Primary osteoarthritis of both knees  Continue medication  7. Anemia, unspecified type  - CBC with Differential/Platelet - Iron, TIBC and Ferritin Panel; Future  8. Hyperglycemia  - Hemoglobin A1c

## 2018-01-02 LAB — CBC WITH DIFFERENTIAL/PLATELET
BASOS ABS: 29 {cells}/uL (ref 0–200)
Basophils Relative: 0.6 %
EOS ABS: 382 {cells}/uL (ref 15–500)
Eosinophils Relative: 7.8 %
HEMATOCRIT: 36.6 % — AB (ref 38.5–50.0)
Hemoglobin: 12.4 g/dL — ABNORMAL LOW (ref 13.2–17.1)
LYMPHS ABS: 1544 {cells}/uL (ref 850–3900)
MCH: 30.6 pg (ref 27.0–33.0)
MCHC: 33.9 g/dL (ref 32.0–36.0)
MCV: 90.4 fL (ref 80.0–100.0)
MPV: 11.2 fL (ref 7.5–12.5)
Monocytes Relative: 15.5 %
NEUTROS PCT: 44.6 %
Neutro Abs: 2185 cells/uL (ref 1500–7800)
Platelets: 207 10*3/uL (ref 140–400)
RBC: 4.05 10*6/uL — ABNORMAL LOW (ref 4.20–5.80)
RDW: 12.1 % (ref 11.0–15.0)
Total Lymphocyte: 31.5 %
WBC: 4.9 10*3/uL (ref 3.8–10.8)
WBCMIX: 760 {cells}/uL (ref 200–950)

## 2018-01-02 LAB — LIPID PANEL
Cholesterol: 128 mg/dL (ref <200)
HDL: 51 mg/dL (ref >40)
LDL Cholesterol (Calc): 64 mg/dL (calc)
NON-HDL CHOLESTEROL (CALC): 77 mg/dL (ref <130)
Total CHOL/HDL Ratio: 2.5 (calc) (ref <5.0)
Triglycerides: 45 mg/dL (ref <150)

## 2018-01-02 LAB — HEMOGLOBIN A1C
Hgb A1c MFr Bld: 5.3 % of total Hgb (ref <5.7)
Mean Plasma Glucose: 105 (calc)
eAG (mmol/L): 5.8 (calc)

## 2018-01-02 LAB — COMPLETE METABOLIC PANEL WITH GFR
AG RATIO: 1.7 (calc) (ref 1.0–2.5)
ALKALINE PHOSPHATASE (APISO): 38 U/L — AB (ref 40–115)
ALT: 22 U/L (ref 9–46)
AST: 21 U/L (ref 10–35)
Albumin: 4.3 g/dL (ref 3.6–5.1)
BILIRUBIN TOTAL: 0.5 mg/dL (ref 0.2–1.2)
BUN: 13 mg/dL (ref 7–25)
CALCIUM: 9.8 mg/dL (ref 8.6–10.3)
CO2: 27 mmol/L (ref 20–32)
Chloride: 102 mmol/L (ref 98–110)
Creat: 1.07 mg/dL (ref 0.70–1.11)
GFR, EST NON AFRICAN AMERICAN: 64 mL/min/{1.73_m2} (ref >=60)
GFR, Est African American: 74 mL/min/{1.73_m2} (ref >=60)
GLOBULIN: 2.6 g/dL (ref 1.9–3.7)
Glucose, Bld: 88 mg/dL (ref 65–99)
POTASSIUM: 4.4 mmol/L (ref 3.5–5.3)
SODIUM: 136 mmol/L (ref 135–146)
Total Protein: 6.9 g/dL (ref 6.1–8.1)

## 2018-01-06 ENCOUNTER — Telehealth: Payer: Self-pay

## 2018-01-06 NOTE — Telephone Encounter (Signed)
Called to schedule AWV. Last performed 12/17/16. LVM requesting returned call.

## 2018-03-10 ENCOUNTER — Telehealth: Payer: Self-pay | Admitting: Family Medicine

## 2018-03-10 NOTE — Telephone Encounter (Signed)
Enalapril was sent to [facility] on 12/31/17 and it was too soon to process. [facility] started processing his order on 03/08/18 and is shipping today. Called and left message that patient has 90 tablets coming and 1 refill remaining. After he gets down to 75% of his medication or 2 months into it he can call to activate his refill request.

## 2018-03-10 NOTE — Telephone Encounter (Signed)
Pt wife said that her husband has called the pharm [facility]) for refill on his blood pressure medication and they told her to get with her doctor. He only has enough for this week. The patient has an appt in Oct for followup.

## 2018-04-21 ENCOUNTER — Other Ambulatory Visit: Payer: Self-pay

## 2018-04-21 DIAGNOSIS — E785 Hyperlipidemia, unspecified: Secondary | ICD-10-CM

## 2018-04-21 MED ORDER — PRAVASTATIN SODIUM 40 MG PO TABS: 40.0000 mg | ORAL_TABLET | Freq: Every day | ORAL | 0 refills | Status: DC

## 2018-04-21 NOTE — Telephone Encounter (Signed)
Refill request was sent to Dr. Krichna Sowles for approval and submission.  

## 2018-04-25 ENCOUNTER — Other Ambulatory Visit: Payer: Self-pay | Admitting: Sports Medicine

## 2018-04-25 DIAGNOSIS — M5137 Other intervertebral disc degeneration, lumbosacral region: Secondary | ICD-10-CM

## 2018-04-25 DIAGNOSIS — M51379 Other intervertebral disc degeneration, lumbosacral region without mention of lumbar back pain or lower extremity pain: Secondary | ICD-10-CM

## 2018-04-25 DIAGNOSIS — M4306 Spondylolysis, lumbar region: Secondary | ICD-10-CM

## 2018-04-25 DIAGNOSIS — M545 Low back pain, unspecified: Secondary | ICD-10-CM

## 2018-04-25 DIAGNOSIS — G8929 Other chronic pain: Secondary | ICD-10-CM

## 2018-04-25 DIAGNOSIS — M4126 Other idiopathic scoliosis, lumbar region: Secondary | ICD-10-CM

## 2018-05-08 ENCOUNTER — Ambulatory Visit
Admission: RE | Admit: 2018-05-08 | Discharge: 2018-05-08 | Disposition: A | Discharge status: Home / Self Care | Payer: Medicare Other | Source: Ambulatory Visit | Attending: Sports Medicine | Admitting: Sports Medicine

## 2018-05-08 DIAGNOSIS — M48061 Spinal stenosis, lumbar region without neurogenic claudication: Secondary | ICD-10-CM | POA: Diagnosis not present

## 2018-05-08 DIAGNOSIS — M545 Low back pain: Secondary | ICD-10-CM | POA: Diagnosis not present

## 2018-05-08 DIAGNOSIS — M4804 Spinal stenosis, thoracic region: Secondary | ICD-10-CM | POA: Diagnosis not present

## 2018-05-08 DIAGNOSIS — M4306 Spondylolysis, lumbar region: Secondary | ICD-10-CM

## 2018-05-08 DIAGNOSIS — G8929 Other chronic pain: Secondary | ICD-10-CM

## 2018-05-08 DIAGNOSIS — M4317 Spondylolisthesis, lumbosacral region: Secondary | ICD-10-CM | POA: Diagnosis not present

## 2018-05-08 DIAGNOSIS — M4126 Other idiopathic scoliosis, lumbar region: Secondary | ICD-10-CM

## 2018-05-08 DIAGNOSIS — M419 Scoliosis, unspecified: Secondary | ICD-10-CM | POA: Diagnosis not present

## 2018-05-08 DIAGNOSIS — M5137 Other intervertebral disc degeneration, lumbosacral region: Secondary | ICD-10-CM

## 2018-06-11 ENCOUNTER — Ambulatory Visit: Payer: Medicare Other | Admitting: Urology

## 2018-06-11 ENCOUNTER — Encounter: Payer: Self-pay | Admitting: Urology

## 2018-06-11 VITALS — BP 129/74 | HR 111

## 2018-06-11 DIAGNOSIS — N138 Other obstructive and reflux uropathy: Secondary | ICD-10-CM | POA: Diagnosis not present

## 2018-06-11 DIAGNOSIS — R634 Abnormal weight loss: Secondary | ICD-10-CM | POA: Diagnosis not present

## 2018-06-11 DIAGNOSIS — N401 Enlarged prostate with lower urinary tract symptoms: Secondary | ICD-10-CM | POA: Diagnosis not present

## 2018-06-11 LAB — URINALYSIS, COMPLETE
Bilirubin, UA: NEGATIVE
Glucose, UA: NEGATIVE
LEUKOCYTES UA: NEGATIVE
Nitrite, UA: NEGATIVE
PH UA: 5.5 (ref 5.0–7.5)
RBC UA: NEGATIVE
Specific Gravity, UA: 1.015 (ref 1.005–1.030)
Urobilinogen, Ur: 0.2 mg/dL (ref 0.2–1.0)

## 2018-06-11 LAB — BLADDER SCAN AMB NON-IMAGING

## 2018-06-11 LAB — MICROSCOPIC EXAMINATION
EPITHELIAL CELLS (NON RENAL): NONE SEEN /HPF (ref 0–10)
RBC MICROSCOPIC, UA: NONE SEEN /HPF (ref 0–2)
WBC, UA: NONE SEEN /hpf (ref 0–5)

## 2018-06-12 ENCOUNTER — Encounter: Payer: Self-pay | Admitting: Urology

## 2018-06-12 LAB — COMPREHENSIVE METABOLIC PANEL
ALT: 36 IU/L (ref 0–44)
AST: 28 IU/L (ref 0–40)
Albumin/Globulin Ratio: 2 (ref 1.2–2.2)
Albumin: 4.7 g/dL (ref 3.5–4.7)
Alkaline Phosphatase: 42 IU/L (ref 39–117)
BUN/Creatinine Ratio: 10 (ref 10–24)
BUN: 12 mg/dL (ref 8–27)
Bilirubin Total: 0.6 mg/dL (ref 0.0–1.2)
CALCIUM: 10.4 mg/dL — AB (ref 8.6–10.2)
CHLORIDE: 97 mmol/L (ref 96–106)
CO2: 21 mmol/L (ref 20–29)
Creatinine, Ser: 1.26 mg/dL (ref 0.76–1.27)
GFR, EST AFRICAN AMERICAN: 60 mL/min/{1.73_m2} (ref >59)
GFR, EST NON AFRICAN AMERICAN: 52 mL/min/{1.73_m2} — AB (ref >59)
GLUCOSE: 101 mg/dL — AB (ref 65–99)
Globulin, Total: 2.4 g/dL (ref 1.5–4.5)
Potassium: 4.3 mmol/L (ref 3.5–5.2)
Sodium: 135 mmol/L (ref 134–144)
TOTAL PROTEIN: 7.1 g/dL (ref 6.0–8.5)

## 2018-06-12 LAB — CBC WITH DIFFERENTIAL
BASOS ABS: 0 10*3/uL (ref 0.0–0.2)
Basos: 0 %
EOS (ABSOLUTE): 0 10*3/uL (ref 0.0–0.4)
EOS: 0 %
HEMATOCRIT: 36.2 % — AB (ref 37.5–51.0)
HEMOGLOBIN: 12.5 g/dL — AB (ref 13.0–17.7)
IMMATURE GRANS (ABS): 0 10*3/uL (ref 0.0–0.1)
IMMATURE GRANULOCYTES: 0 %
LYMPHS: 16 %
Lymphocytes Absolute: 1 10*3/uL (ref 0.7–3.1)
MCH: 31.4 pg (ref 26.6–33.0)
MCHC: 34.5 g/dL (ref 31.5–35.7)
MCV: 91 fL (ref 79–97)
MONOCYTES: 11 %
Monocytes Absolute: 0.7 10*3/uL (ref 0.1–0.9)
NEUTROS ABS: 4.7 10*3/uL (ref 1.4–7.0)
Neutrophils: 73 %
RBC: 3.98 x10E6/uL — ABNORMAL LOW (ref 4.14–5.80)
RDW: 12.3 % (ref 12.3–15.4)
WBC: 6.4 10*3/uL (ref 3.4–10.8)

## 2018-06-12 NOTE — Progress Notes (Signed)
06/11/2018 7:44 AM   Eugene Murphy 01/15/1932 [id number]  Referring provider: Steele Sizer, MD [address] [place], [place] [address]  Chief Complaint  Patient presents with  . Benign Prostatic Hypertrophy    HPI: 84 year old male followed for BPH.  I last saw him in February 2019 with stable lower urinary tract symptoms.  He presents today with his wife complaining of an unintentional 20 pound weight loss over the last 6 weeks.  He also complains of weakness and nausea.  His voiding pattern is stable and he denies dysuria or gross hematuria.  His wife stated they have an appointment with his PCP next month to have his weight loss evaluated however wanted to have his prostate checked to make sure this was okay.  He denies dysuria or gross hematuria.   PMH: Past Medical History:  Diagnosis Date  . Asthma   . BPH (benign prostatic hyperplasia)    Followed by Bonna Gains.  Marland Kitchen GERD (gastroesophageal reflux disease)   . Hyperlipidemia   . Hypertension     Surgical History: Past Surgical History:  Procedure Laterality Date  . COSMETIC SURGERY Left    Left Arm Plastic Surgery in 1970s    Home Medications:  Allergies as of 06/11/2018      Reactions   Shellfish Allergy Swelling   Throat swells      Medication List        Accurate as of 06/11/18 11:59 PM. Always use your most recent med list.          Albuterol Sulfate 108 (90 Base) MCG/ACT Aepb   ASMANEX (30 METERED DOSES) 220 MCG/INH inhaler Generic drug:  mometasone   aspirin 81 MG chewable tablet Chew by mouth.   azelastine 0.1 % nasal spray Commonly known as:  ASTELIN Place into the nose.   doxazosin 8 MG tablet Commonly known as:  CARDURA Take 1 tablet (8 mg total) by mouth daily.   dutasteride 0.5 MG capsule Commonly known as:  AVODART Take 1 capsule (0.5 mg total) by mouth daily.   enalapril 10 MG tablet Commonly known as:  VASOTEC Take 1 tablet (10 mg total) by mouth daily.     fluticasone 50 MCG/ACT nasal spray Commonly known as:  FLONASE   lansoprazole 30 MG capsule Commonly known as:  PREVACID Take 1 capsule (30 mg total) by mouth daily.   latanoprost 0.005 % ophthalmic solution Commonly known as:  XALATAN   levocetirizine 5 MG tablet Commonly known as:  XYZAL Take 1 tablet (5 mg total) by mouth every evening.   meloxicam 7.5 MG tablet Commonly known as:  MOBIC Take 7.5 mg by mouth daily.   Potassium 99 MG Tabs Take 1 tablet by mouth daily at 8 pm.   pravastatin 40 MG tablet Commonly known as:  PRAVACHOL Take 1 tablet (40 mg total) by mouth daily.   timolol 0.5 % ophthalmic solution Commonly known as:  TIMOPTIC   triamcinolone cream 0.1 % Commonly known as:  KENALOG   vitamin C 1000 MG tablet Take by mouth.   Vitamin D-3 5000 units Tabs Take by mouth daily.       Allergies:  Allergies  Allergen Reactions  . Shellfish Allergy Swelling    Throat swells    Family History: Family History  Problem Relation Age of Onset  . Congestive Heart Failure Father   . Healthy Sister   . Dementia Brother     Social History:  reports that he has never smoked.  He has never used smokeless tobacco. He reports that he drinks alcohol. He reports that he does not use drugs.  ROS: UROLOGY Frequent Urination?: Yes Hard to postpone urination?: No Burning/pain with urination?: No Get up at night to urinate?: Yes Leakage of urine?: No Urine stream starts and stops?: No Trouble starting stream?: No Do you have to strain to urinate?: No Blood in urine?: No Urinary tract infection?: No Sexually transmitted disease?: No Injury to kidneys or bladder?: No Painful intercourse?: No Weak stream?: No Erection problems?: No Penile pain?: No  Gastrointestinal Nausea?: No Vomiting?: No Indigestion/heartburn?: No Diarrhea?: No Constipation?: No  Constitutional Fever: No Night sweats?: No Weight loss?: No Fatigue?: No  Skin Skin  rash/lesions?: No Itching?: No  Eyes Blurred vision?: No Double vision?: No  Ears/Nose/Throat Sore throat?: No Sinus problems?: No  Hematologic/Lymphatic Swollen glands?: No Easy bruising?: No  Cardiovascular Leg swelling?: No Chest pain?: No  Respiratory Cough?: No Shortness of breath?: No  Endocrine Excessive thirst?: No  Musculoskeletal Back pain?: No Joint pain?: No  Neurological Headaches?: No Dizziness?: No  Psychologic Depression?: No Anxiety?: No  Physical Exam: BP 129/74   Pulse (!) 111   Constitutional:  Alert and oriented, No acute distress. HEENT: [place] AT, moist mucus membranes.  Trachea midline, no masses. Cardiovascular: No clubbing, cyanosis, or edema. Respiratory: Normal respiratory effort, no increased work of breathing. GI: Abdomen is soft, nontender, nondistended, no abdominal masses GU: No CVA tenderness.  Testes descended bilaterally without masses or tenderness.  Prostate 50 g, smooth without nodules. Lymph: No cervical or inguinal lymphadenopathy. Skin: No rashes, bruises or suspicious lesions. Neurologic: Grossly intact, no focal deficits, moving all 4 extremities. Psychiatric: Normal mood and affect.    Assessment & Plan:   84 year old male with significant unexplained weight loss, weakness and nausea.  They were informed that his prostate exam today was normal.  I went ahead and ordered a comprehensive metabolic panel, CBC, chest x-ray and CT of the abdomen and pelvis.  They will be notified with the results.  Urinalysis today showed no abnormalities.   Abbie Sons, [place] [address], [place], [place] [address] [phone]

## 2018-06-15 ENCOUNTER — Emergency Department: Payer: Medicare Other

## 2018-06-15 ENCOUNTER — Other Ambulatory Visit: Payer: Self-pay

## 2018-06-15 ENCOUNTER — Emergency Department
Admission: EM | Admit: 2018-06-15 | Discharge: 2018-06-15 | Disposition: A | Discharge status: Home / Self Care | Payer: Medicare Other | Attending: Emergency Medicine | Admitting: Emergency Medicine

## 2018-06-15 ENCOUNTER — Encounter: Payer: Self-pay | Admitting: Emergency Medicine

## 2018-06-15 DIAGNOSIS — I10 Essential (primary) hypertension: Secondary | ICD-10-CM | POA: Diagnosis not present

## 2018-06-15 DIAGNOSIS — J45909 Unspecified asthma, uncomplicated: Secondary | ICD-10-CM | POA: Insufficient documentation

## 2018-06-15 DIAGNOSIS — R1900 Intra-abdominal and pelvic swelling, mass and lump, unspecified site: Secondary | ICD-10-CM | POA: Diagnosis not present

## 2018-06-15 DIAGNOSIS — Z79899 Other long term (current) drug therapy: Secondary | ICD-10-CM | POA: Insufficient documentation

## 2018-06-15 DIAGNOSIS — R634 Abnormal weight loss: Secondary | ICD-10-CM | POA: Diagnosis present

## 2018-06-15 LAB — URINALYSIS, COMPLETE (UACMP) WITH MICROSCOPIC
BILIRUBIN URINE: NEGATIVE
Bacteria, UA: NONE SEEN
GLUCOSE, UA: NEGATIVE mg/dL
HGB URINE DIPSTICK: NEGATIVE
KETONES UR: NEGATIVE mg/dL
LEUKOCYTES UA: NEGATIVE
NITRITE: NEGATIVE
PH: 6 (ref 5.0–8.0)
Protein, ur: NEGATIVE mg/dL
SPECIFIC GRAVITY, URINE: 1.008 (ref 1.005–1.030)
Squamous Epithelial / LPF: NONE SEEN (ref 0–5)

## 2018-06-15 LAB — CBC
HEMATOCRIT: 37.9 % — AB (ref 40.0–52.0)
Hemoglobin: 13 g/dL (ref 13.0–18.0)
MCH: 31.5 pg (ref 26.0–34.0)
MCHC: 34.2 g/dL (ref 32.0–36.0)
MCV: 92.1 fL (ref 80.0–100.0)
PLATELETS: 241 10*3/uL (ref 150–440)
RBC: 4.11 MIL/uL — ABNORMAL LOW (ref 4.40–5.90)
RDW: 13.4 % (ref 11.5–14.5)
WBC: 4.2 10*3/uL (ref 3.8–10.6)

## 2018-06-15 LAB — COMPREHENSIVE METABOLIC PANEL
ALK PHOS: 34 U/L — AB (ref 38–126)
ALT: 35 U/L (ref 0–44)
AST: 30 U/L (ref 15–41)
Albumin: 4.4 g/dL (ref 3.5–5.0)
Anion gap: 10 (ref 5–15)
BILIRUBIN TOTAL: 0.7 mg/dL (ref 0.3–1.2)
BUN: 13 mg/dL (ref 8–23)
CALCIUM: 9.8 mg/dL (ref 8.9–10.3)
CO2: 25 mmol/L (ref 22–32)
Chloride: 99 mmol/L (ref 98–111)
Creatinine, Ser: 1.1 mg/dL (ref 0.61–1.24)
GFR calc Af Amer: >60 mL/min (ref >60)
GFR, EST NON AFRICAN AMERICAN: 60 mL/min — AB (ref >60)
Glucose, Bld: 109 mg/dL — ABNORMAL HIGH (ref 70–99)
POTASSIUM: 3.9 mmol/L (ref 3.5–5.1)
Sodium: 134 mmol/L — ABNORMAL LOW (ref 135–145)
TOTAL PROTEIN: 7.1 g/dL (ref 6.5–8.1)

## 2018-06-15 LAB — LIPASE, BLOOD: Lipase: 24 U/L (ref 11–51)

## 2018-06-15 LAB — TSH: TSH: 1.946 u[IU]/mL (ref 0.350–4.500)

## 2018-06-15 MED ORDER — ONDANSETRON HCL 4 MG/2ML IJ SOLN
4.0000 mg | Freq: Once | INTRAMUSCULAR | Status: AC
  Administered 2018-06-15: 4 mg via INTRAVENOUS
  Filled 2018-06-15: qty 2

## 2018-06-15 MED ORDER — SODIUM CHLORIDE 0.9 % IV BOLUS
500.0000 mL | Freq: Once | INTRAVENOUS | Status: AC
  Administered 2018-06-15: 500 mL via INTRAVENOUS

## 2018-06-15 MED ORDER — IOPAMIDOL (ISOVUE-300) INJECTION 61%
100.0000 mL | Freq: Once | INTRAVENOUS | Status: AC | PRN
  Administered 2018-06-15: 100 mL via INTRAVENOUS

## 2018-06-15 MED ORDER — ONDANSETRON 4 MG PO TBDP: 4.0000 mg | ORAL_TABLET | Freq: Four times a day (QID) | ORAL | 0 refills | Status: DC | PRN

## 2018-06-15 MED ORDER — IOPAMIDOL (ISOVUE-300) INJECTION 61%
30.0000 mL | Freq: Once | INTRAVENOUS | Status: AC | PRN
  Administered 2018-06-15: 30 mL via ORAL

## 2018-06-15 NOTE — ED Provider Notes (Signed)
[facility] Emergency Department Provider Note ____________________________________________   First MD Initiated Contact with Patient 06/15/18 1042     (approximate)  I have reviewed the triage vital signs and the nursing notes.   HISTORY  Chief Complaint Constipation; Weight Loss; and Nausea   HPI Eugene Murphy is a 84 y.o. male history of high blood pressure high cholesterol and enlarged prostate  Patient reports he saw his urologist a few days ago, and reported to him as well as his orthopedic physicians that he is been having a fair amount of weight loss with nausea for couple of months time with almost 20 pounds weight loss since about July.  No vomiting.  Still eating ate chicken and beans last night but he continues to just feel he ongoing sense of discomfort off in his lower abdomen intermittently with nausea and decreased appetite.  Told his doctor and reports Dr. Bernardo Heater and ordered outpatient testing, but has not been able to have a CAT scan scheduled yet.  He is not in any pain now, reports he will feel nauseated increased feeling of fullness.  No swelling in the legs.  Patient concerned he could have or could be developing colon cancer, and also has made an appointment with his primary care doctor cannot see him until October and would like for them to be able to set him up for a colonoscopy.  No chest pain or vomiting.   Past Medical History:  Diagnosis Date  . Asthma   . BPH (benign prostatic hyperplasia)    Followed by Bonna Gains.  Marland Kitchen GERD (gastroesophageal reflux disease)   . Hyperlipidemia   . Hypertension     Patient Active Problem List   Diagnosis Date Noted  . OSA on CPAP 12/31/2017  . GERD (gastroesophageal reflux disease) 07/25/2016  . Environmental and seasonal allergies 02/29/2016  . Elevated PSA 11/02/2015  . Hyperlipidemia 04/13/2015  . Hypertension 04/06/2015  . BPH with obstruction/lower urinary tract symptoms  04/06/2015  . Hypersomnia with sleep apnea 12/21/2013  . Asthma, mild intermittent 12/21/2013    Past Surgical History:  Procedure Laterality Date  . COSMETIC SURGERY Left    Left Arm Plastic Surgery in 1970s    Prior to Admission medications   Medication Sig Start Date End Date Taking? Authorizing Provider  latanoprost (XALATAN) 0.005 % ophthalmic solution Place 1 drop into both eyes at bedtime.  03/08/15  Yes [provider]  Albuterol Sulfate 108 (90 Base) MCG/ACT AEPB  01/24/15   [provider]  Ascorbic Acid (VITAMIN C) 1000 MG tablet Take by mouth.    [provider]  [facility] 30 METERED DOSES 220 MCG/INH inhaler  01/24/15   [provider]  aspirin 81 MG chewable tablet Chew by mouth.    [provider]  azelastine (ASTELIN) 0.1 % nasal spray Place into the nose. 08/05/17   [provider]  Cholecalciferol (VITAMIN D-3) 5000 units TABS Take by mouth daily.    [provider]  doxazosin (CARDURA) 8 MG tablet Take 1 tablet (8 mg total) by mouth daily. 11/19/17   Stoioff, Ronda Fairly, MD  dutasteride (AVODART) 0.5 MG capsule Take 1 capsule (0.5 mg total) by mouth daily. 11/19/17   Stoioff, Ronda Fairly, MD  enalapril (VASOTEC) 10 MG tablet Take 1 tablet (10 mg total) by mouth daily. 12/31/17   Steele Sizer, MD  fluticasone Asencion Islam) 50 MCG/ACT nasal spray  01/24/15   [provider]  lansoprazole (PREVACID) 30 MG capsule Take  1 capsule (30 mg total) by mouth daily. 12/31/17   Steele Sizer, MD  levocetirizine (XYZAL) 5 MG tablet Take 1 tablet (5 mg total) by mouth every evening. 03/26/16   Ashok Norris, MD  meloxicam (MOBIC) 7.5 MG tablet Take 7.5 mg by mouth daily. 12/23/17   [provider]  ondansetron (ZOFRAN ODT) 4 MG disintegrating tablet Take 1 tablet (4 mg total) by mouth every 6 (six) hours as needed for nausea or vomiting. 06/15/18   Delman Kitten, MD  Potassium 99 MG TABS Take 1 tablet by mouth daily at 8  pm.    [provider]  pravastatin (PRAVACHOL) 40 MG tablet Take 1 tablet (40 mg total) by mouth daily. 04/21/18   Steele Sizer, MD  timolol (TIMOPTIC) 0.5 % ophthalmic solution  01/25/15   [provider]  triamcinolone cream (KENALOG) 0.1 %  11/23/16   [provider]    Allergies Shellfish allergy  Family History  Problem Relation Age of Onset  . Congestive Heart Failure Father   . Healthy Sister   . Dementia Brother     Social History Social History   Tobacco Use  . Smoking status: Never Smoker  . Smokeless tobacco: Never Used  Substance Use Topics  . Alcohol use: Yes    Alcohol/week: 0.0 standard drinks    Comment: Occasional  . Drug use: No    Review of Systems  Constitutional: No fever/chills. Eyes: No visual changes. ENT: No sore throat. Cardiovascular: Denies chest pain. Respiratory: Denies shortness of breath. Gastrointestinal: No vomiting.  No diarrhea.  He currently feels constipated.  Still has small bowel movements.  Genitourinary: Negative for dysuria. Musculoskeletal: Negative for back pain. Skin: Negative for rash. Neurological: Negative for headaches, focal weakness or numbness.  ____________________________________________   PHYSICAL EXAM:  VITAL SIGNS: ED Triage Vitals  Enc Vitals Group     BP 06/15/18 0906 (!) 144/76     Pulse Rate 06/15/18 0906 93     Resp 06/15/18 0906 16     Temp 06/15/18 0906 98.4 F (36.9 C)     Temp Source 06/15/18 0906 Oral     SpO2 06/15/18 0906 97 %     Weight 06/15/18 0907 217 lb (98.4 kg)     Height 06/15/18 0907 5\' 8"  (1.727 m)     Head Circumference --      Peak Flow --      Pain Score 06/15/18 0906 3     Pain Loc --      Pain Edu? --      Excl. in [place]? --     Constitutional: Alert and oriented. Well appearing and in no acute distress. Eyes: Conjunctivae are normal. Head: Atraumatic. Nose: No congestion/rhinnorhea. Mouth/Throat: Mucous membranes are moist. Neck: No  stridor.   Cardiovascular: Normal rate, regular rhythm. Grossly normal heart sounds.  Good peripheral circulation. Respiratory: Normal respiratory effort.  No retractions. Lungs CTAB. Gastrointestinal: Soft and nontender there is some mild discomfort suprapubically without rebound or guarding.  No upper abdominal pain.  Negative right upper quadrant pain.. No distention. Musculoskeletal: No lower extremity tenderness nor edema. Neurologic:  Normal speech and language. No gross focal neurologic deficits are appreciated.  Skin:  Skin is warm, dry and intact. No rash noted. Psychiatric: Mood and affect are normal. Speech and behavior are normal.  ____________________________________________   LABS (all labs ordered are listed, but only abnormal results are displayed)  Labs Reviewed  COMPREHENSIVE METABOLIC PANEL - Abnormal; Notable for the following  components:      Result Value   Sodium 134 (*)    Glucose, Bld 109 (*)    Alkaline Phosphatase 34 (*)    GFR calc non Af Amer 60 (*)    All other components within normal limits  CBC - Abnormal; Notable for the following components:   RBC 4.11 (*)    HCT 37.9 (*)    All other components within normal limits  URINALYSIS, COMPLETE (UACMP) WITH MICROSCOPIC - Abnormal; Notable for the following components:   Color, Urine YELLOW (*)    APPearance CLEAR (*)    All other components within normal limits  LIPASE, BLOOD  TSH   ____________________________________________  EKG  Reviewed and entered by me at 1115 Heart rate 79 QRS 90 QTc 450 No evidence of acute ischemia ____________________________________________  RADIOLOGY  Dg Chest 2 View  Result Date: 06/15/2018 CLINICAL DATA:  Weakness and weight loss EXAM: CHEST - 2 VIEW COMPARISON:  None. FINDINGS: The heart size and mediastinal contours are within normal limits. Both lungs are clear. The visualized skeletal structures are unremarkable. IMPRESSION: No active cardiopulmonary disease.  Electronically Signed   By: Dorise Bullion III M.D   On: 06/15/2018 11:29   Ct Abdomen Pelvis W Contrast  Result Date: 06/15/2018 CLINICAL DATA:  Weight loss.  Difficulty with bowel movements. EXAM: CT ABDOMEN AND PELVIS WITH CONTRAST TECHNIQUE: Multidetector CT imaging of the abdomen and pelvis was performed using the standard protocol following bolus administration of intravenous contrast. CONTRAST:  172mL ISOVUE-300 IOPAMIDOL (ISOVUE-300) INJECTION 61% COMPARISON:  None. FINDINGS: Lower chest: No acute abnormality. Hepatobiliary: Several subcapsular areas of hypoattenuation in the bilateral lobes of the liver, the most prominent measuring 3.1 cm in segment IV adjacent to the falciform ligament. Pancreas: Unremarkable. No pancreatic ductal dilatation or surrounding inflammatory changes. Spleen: Normal in size without focal abnormality. Adrenals/Urinary Tract: Left lateral leaflet adrenal mass measures 3.2 cm and demonstrates intermediate attenuation. Smaller 1.4 cm right adrenal nodule is also noted within the lateral leaflet of the gland. Mild bilateral perinephric stranding. No hydronephrosis or nephrolithiasis. Few mm too small to be actually characterize hypoattenuated lesion within the superior pole of the left kidney. Stomach/Bowel: Stomach is within normal limits. Appendix appears normal. No evidence of small bowel wall thickening, distention, or inflammatory changes. Relatively short segment of irregular mucosal thickening of the cecum, incompletely evaluated due to presence of formed stool. Vascular/Lymphatic: Mild aortic atherosclerosis. No enlarged abdominal or pelvic lymph nodes. Reproductive: Enlargement of the prostate gland, which measures 6.4 cm in greatest dimension. Other: No abdominal wall hernia or abnormality. No abdominopelvic ascites. Musculoskeletal: Bilateral L5 pars articularis defects with minimal anterolisthesis of L5 on S1. Multilevel spondylosis of the lumbosacral spine.  IMPRESSION: Several ill-defined areas of hypoattenuation within the liver, the most prominent in the falciform ligament measures 3.2 cm. These may represent areas of metastatic disease versus liver abscesses. Indeterminate 3.2 cm left adrenal mass. Indeterminate 1.4 cm right adrenal nodule. Relatively short segment of irregular mucosal thickening of the cecum, incompletely evaluated due to presence of formed stool. Please correlate to results of colonoscopy. Enlargement of the prostate gland. Please correlate with serum PSA values. Electronically Signed   By: Fidela Salisbury M.D.   On: 06/15/2018 12:49     CT results discussed with Dr. Mike Gip, also with patient and wife. ____________________________________________   PROCEDURES  Procedure(s) performed: None  Procedures  Critical Care performed: No  ____________________________________________   INITIAL IMPRESSION / ASSESSMENT AND PLAN / ED COURSE  Pertinent labs & imaging results that were available during my care of the patient were reviewed by me and considered in my medical decision making (see chart for details).  Patient presents for evaluation of weight loss, nausea.  Couple months of symptoms, with 20 pounds weight loss and seeing his urologist, concern for possible malignancy primarily based on the history.  Does not appear to have a frank obstruction, does report nausea and constipation.  Clinically reassuring exam but some discomfort in the lower abdomen.  No cardiac or pulmonary symptoms no neurologic symptoms.  Notable significant weight loss unintended with decreased appetite.  Discussed with patient his family, they are very anxious and wish to proceed forward with his outpatient evaluation, discussed with them and we will proceed with CT scan to further evaluate rule out obstruction, evaluate further for malignancy.  If imaging studies here do not denote abnormality I did speak with the patient his wife that he would  certainly still need to follow-up with his primary care doctor and I would also suggest he obtain the desired outpatient colonoscopy as his symptoms are very concerning for possible metastatic etiology.  No clear infectious symptoms.    Discussed CT scan with the patient, need for close follow-up and Dr. Mike Gip return page and she will be able to see the patient Monday or Tuesday at this week, patient and family agreeable with plan for close follow-up within about 72 hours with oncology.  He is alert, hemodynamic stable, tolerating by mouth well.  High suspicion for possible malignancy.  Patient understanding of this, will follow up with Dr. Ancil Boozer in [place].  No signs or symptoms of infectious etiology, normal white count no fever, no travel history.  ____________________________________________   FINAL CLINICAL IMPRESSION(S) / ED DIAGNOSES  Final diagnoses:  Abdominal mass, unspecified abdominal location      NEW MEDICATIONS STARTED DURING THIS VISIT:  Discharge Medication List as of 06/15/2018  2:05 PM    START taking these medications   Details  ondansetron (ZOFRAN ODT) 4 MG disintegrating tablet Take 1 tablet (4 mg total) by mouth every 6 (six) hours as needed for nausea or vomiting., Starting Sun 06/15/2018, Print         Note:  This document was prepared using Dragon voice recognition software and may include unintentional dictation errors.     Delman Kitten, MD 06/15/18 [phone]

## 2018-06-15 NOTE — ED Notes (Signed)
Pt return from CT.

## 2018-06-15 NOTE — ED Triage Notes (Signed)
Patient states, "I haven't had a good bowel movement without taking a laxative since July."  Patient also reports losing weight very rapidly without trying to.  Patient has lost over 20lb since July.  Patient reports feeling bloated and nauseous.  Denies vomiting and diarrhea.

## 2018-06-15 NOTE — Discharge Instructions (Signed)
Is we discussed the need for close evaluation and follow-up, follow-up closely with Dr. Mike Gip and Dr. Ancil Boozer within 1-2 weeks.  Please return to the emergency room right away if you are to develop a fever, severe nausea, your pain becomes severe or worsens, you are unable to keep food down, begin vomiting any dark or bloody fluid, you develop any dark or bloody stools, feel dehydrated, or other new concerns or symptoms arise.

## 2018-06-17 ENCOUNTER — Encounter: Payer: Self-pay | Admitting: Family Medicine

## 2018-06-17 ENCOUNTER — Telehealth: Payer: Self-pay

## 2018-06-17 ENCOUNTER — Inpatient Hospital Stay: Payer: Medicare Other | Attending: Hematology and Oncology | Admitting: Hematology and Oncology

## 2018-06-17 ENCOUNTER — Inpatient Hospital Stay: Payer: Medicare Other

## 2018-06-17 ENCOUNTER — Ambulatory Visit (INDEPENDENT_AMBULATORY_CARE_PROVIDER_SITE_OTHER): Payer: Medicare Other | Admitting: Family Medicine

## 2018-06-17 ENCOUNTER — Other Ambulatory Visit: Payer: Self-pay

## 2018-06-17 VITALS — BP 138/78 | HR 107 | Temp 98.2°F | Resp 16 | Ht 68.5 in | Wt 218.9 lb

## 2018-06-17 VITALS — BP 148/88 | HR 105 | Temp 96.4°F | Resp 18 | Ht 68.5 in | Wt 218.0 lb

## 2018-06-17 DIAGNOSIS — K219 Gastro-esophageal reflux disease without esophagitis: Secondary | ICD-10-CM | POA: Diagnosis not present

## 2018-06-17 DIAGNOSIS — E279 Disorder of adrenal gland, unspecified: Secondary | ICD-10-CM | POA: Insufficient documentation

## 2018-06-17 DIAGNOSIS — Z7982 Long term (current) use of aspirin: Secondary | ICD-10-CM | POA: Diagnosis not present

## 2018-06-17 DIAGNOSIS — K625 Hemorrhage of anus and rectum: Secondary | ICD-10-CM | POA: Insufficient documentation

## 2018-06-17 DIAGNOSIS — R634 Abnormal weight loss: Secondary | ICD-10-CM

## 2018-06-17 DIAGNOSIS — G4709 Other insomnia: Secondary | ICD-10-CM

## 2018-06-17 DIAGNOSIS — R110 Nausea: Secondary | ICD-10-CM

## 2018-06-17 DIAGNOSIS — M549 Dorsalgia, unspecified: Secondary | ICD-10-CM | POA: Diagnosis not present

## 2018-06-17 DIAGNOSIS — N138 Other obstructive and reflux uropathy: Secondary | ICD-10-CM | POA: Diagnosis not present

## 2018-06-17 DIAGNOSIS — N401 Enlarged prostate with lower urinary tract symptoms: Secondary | ICD-10-CM | POA: Diagnosis not present

## 2018-06-17 DIAGNOSIS — K769 Liver disease, unspecified: Secondary | ICD-10-CM | POA: Diagnosis not present

## 2018-06-17 DIAGNOSIS — Z86010 Personal history of colonic polyps: Secondary | ICD-10-CM | POA: Insufficient documentation

## 2018-06-17 DIAGNOSIS — Z23 Encounter for immunization: Secondary | ICD-10-CM

## 2018-06-17 DIAGNOSIS — E785 Hyperlipidemia, unspecified: Secondary | ICD-10-CM

## 2018-06-17 DIAGNOSIS — E46 Unspecified protein-calorie malnutrition: Secondary | ICD-10-CM | POA: Insufficient documentation

## 2018-06-17 DIAGNOSIS — J45909 Unspecified asthma, uncomplicated: Secondary | ICD-10-CM | POA: Insufficient documentation

## 2018-06-17 DIAGNOSIS — K639 Disease of intestine, unspecified: Secondary | ICD-10-CM | POA: Diagnosis present

## 2018-06-17 DIAGNOSIS — R1031 Right lower quadrant pain: Secondary | ICD-10-CM | POA: Diagnosis not present

## 2018-06-17 DIAGNOSIS — M25551 Pain in right hip: Secondary | ICD-10-CM | POA: Diagnosis not present

## 2018-06-17 DIAGNOSIS — I10 Essential (primary) hypertension: Secondary | ICD-10-CM | POA: Insufficient documentation

## 2018-06-17 DIAGNOSIS — Z79899 Other long term (current) drug therapy: Secondary | ICD-10-CM | POA: Diagnosis not present

## 2018-06-17 LAB — PROTIME-INR
INR: 1.07
Prothrombin Time: 13.8 seconds (ref 11.4–15.2)

## 2018-06-17 LAB — PSA: PROSTATIC SPECIFIC ANTIGEN: 0.43 ng/mL (ref 0.00–4.00)

## 2018-06-17 LAB — APTT: aPTT: 32 seconds (ref 24–36)

## 2018-06-17 MED ORDER — NA SULFATE-K SULFATE-MG SULF 17.5-3.13-1.6 GM/177ML PO SOLN: 1.0000 | ORAL | 0 refills | Status: DC

## 2018-06-17 MED ORDER — TRAZODONE HCL 50 MG PO TABS: 25.0000 mg | ORAL_TABLET | Freq: Every evening | ORAL | 0 refills | Status: DC | PRN

## 2018-06-17 NOTE — Progress Notes (Signed)
[facility] day:  06/17/2018  Chief Complaint: Eugene Murphy is a 84 y.o. male with liver lesions and irregular thickening of the cecum who is referred in consultation by Dr. Jacqualine Code for assessment and management.  HPI:  The patient was seen in the [facility] ER on 06/15/2018 with a several month history of nausea and weight loss. Symptoms roughly began in 03/2018. Since 04/2018, he had lost 20 pounds.  Appetite had decreased.  He noted lower abdominal discomfort.  Abdomen and pelvis CT on 06/15/2018 revealed several ill-defined areas of hypoattenuation within the liver, the most prominent in the falciform ligament measures 3.2 cm. These may represent areas of metastatic disease versus liver abscesses.  There was an indeterminate 3.2 cm left adrenal mass ands an indeterminate 1.4 cm right adrenal nodule.  There was relatively short segment of irregular mucosal thickening of the cecum, incompletely evaluated due to presence of formed stool.  There was enlargement of the prostate gland.  CXR on 06/15/2018 revealed no active cardiopulmonary process.  CBC revealed a hematocrit of 37.9, hemoglobin 13.0, MCV 92.1, platelets 241,000, and WBC 4200.  Creatinine was 1.10.  Albumin was 4.4.  LFTS were normal.  TSH was 1.946.  Patient has a history of BPH and elevated PSA.  He has been followed by Dr. John Giovanni.  He was seen last week.  PSA was 1.18 on 11/19/2016.  Symptomatically, patient is doing "better". He is starting to eat "some better", however he continues to lose weight. Patient has intermittent nausea. Patient has dark stools. He has appreciated some BRBPR with wiping only. He denies chest pain, shortness of breath, palpitations. Patient with tenderness in the RIGHT lower abdomen.   Colonoscopy on 08/27/2011 by Dr. Gaylyn Cheers revealed 2 sessile polyps in the splenic flexure and in the cecum.  Pathology revealed prominent lymphoid aggregate and melanosis  coli in the cecum and a tubular adenoma in the splenic flexure.  He states that he "aged out" for future colonoscopies.  Patient denies family history that is significant for any type of oncologic or hematologic disorder.    Past Medical History:  Diagnosis Date  . Asthma   . BPH (benign prostatic hyperplasia)    Followed by Bonna Gains.  Marland Kitchen GERD (gastroesophageal reflux disease)   . Hyperlipidemia   . Hypertension     Past Surgical History:  Procedure Laterality Date  . COSMETIC SURGERY Left    Left Arm Plastic Surgery in 1970s    Family History  Problem Relation Age of Onset  . Congestive Heart Failure Father   . Healthy Sister   . Dementia Brother     Social History:  reports that he has never smoked. He has never used smokeless tobacco. He reports that he drinks alcohol. He reports that he does not use drugs.  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 The patient is accompanied by his wife [name]) and Mariea Clonts (nurse navigator) today.  Allergies:  Allergies  Allergen Reactions  . Shellfish Allergy Swelling    Throat swells    Current Medications: Current Outpatient Medications  Medication Sig Dispense Refill  . Albuterol Sulfate 108 (90 Base) MCG/ACT AEPB     . Ascorbic Acid (VITAMIN C) 1000 MG tablet Take by mouth.    . ASMANEX 30 METERED DOSES 220 MCG/INH inhaler     . aspirin 81 MG chewable tablet Chew by mouth.    Marland Kitchen azelastine (  ASTELIN) 0.1 % nasal spray Place into the nose.    . Cholecalciferol (VITAMIN D-3) 5000 units TABS Take by mouth daily.    Marland Kitchen doxazosin (CARDURA) 8 MG tablet Take 1 tablet (8 mg total) by mouth daily. 90 tablet 3  . dutasteride (AVODART) 0.5 MG capsule Take 1 capsule (0.5 mg total) by mouth daily. 90 capsule 3  . enalapril (VASOTEC) 10 MG tablet Take 1 tablet (10 mg total) by mouth daily. 90 tablet 1  . fluticasone (FLONASE) 50 MCG/ACT nasal spray     . lansoprazole (PREVACID) 30 MG capsule Take 1 capsule  (30 mg total) by mouth daily. 90 capsule 1  . latanoprost (XALATAN) 0.005 % ophthalmic solution Place 1 drop into both eyes at bedtime.     Marland Kitchen levocetirizine (XYZAL) 5 MG tablet Take 1 tablet (5 mg total) by mouth every evening. 90 tablet 1  . meloxicam (MOBIC) 7.5 MG tablet Take 7.5 mg by mouth daily.  1  . ondansetron (ZOFRAN ODT) 4 MG disintegrating tablet Take 1 tablet (4 mg total) by mouth every 6 (six) hours as needed for nausea or vomiting. 20 tablet 0  . Potassium 99 MG TABS Take 1 tablet by mouth daily at 8 pm.    . pravastatin (PRAVACHOL) 40 MG tablet Take 1 tablet (40 mg total) by mouth daily. 90 tablet 0  . timolol (TIMOPTIC) 0.5 % ophthalmic solution     . triamcinolone cream (KENALOG) 0.1 %      No current facility-administered medications for this visit.     Review of Systems  Constitutional: Positive for weight loss (weight going 20+ pounds since 04/2018). Negative for diaphoresis, fever and malaise/fatigue.  HENT: Negative.   Eyes: Negative.   Respiratory: Negative for cough, hemoptysis, sputum production and shortness of breath.   Cardiovascular: Negative for chest pain, palpitations, orthopnea, leg swelling and PND.  Gastrointestinal: Positive for abdominal pain (RIGHT lower quadrant tenderness), blood in stool (mostly dark in color) and nausea. Negative for constipation, diarrhea and vomiting.  Genitourinary: Negative for dysuria, frequency, hematuria and urgency.       BPH  Musculoskeletal: Positive for joint pain (hip). Negative for back pain, falls and myalgias.       Achy all over.  Skin: Negative for itching and rash.  Neurological: Negative for dizziness, tremors, weakness and headaches.  Endo/Heme/Allergies: Does not bruise/bleed easily.  Psychiatric/Behavioral: Negative for depression, memory loss and suicidal ideas. The patient is not nervous/anxious and does not have insomnia.   All other systems reviewed and are negative.  Performance status (ECOG): 1 -  Symptomatic but completely ambulatory  Vital Signs: BP (!) 148/88 (BP Location: Left Arm, Patient Position: Sitting)   Pulse (!) 105   Temp (!) 96.4 F (35.8 C) (Tympanic)   Resp 18   Ht 5' 8.5" (1.74 m)   Wt 218 lb (98.9 kg)   BMI 32.66 kg/m   Physical Exam  Constitutional: He is oriented to person, place, and time and well-developed, well-nourished, and in no distress.  HENT:  Head: Normocephalic and atraumatic.  Gray hair.  Eyes: Pupils are equal, round, and reactive to light. EOM are normal. No scleral icterus.  Black rimmed glasses.  Brown eyes.  Neck: Normal range of motion. Neck supple. No tracheal deviation present. No thyromegaly present.  Cardiovascular: Normal rate, regular rhythm and normal heart sounds. Exam reveals no gallop and no friction rub.  No murmur heard. Pulmonary/Chest: Effort normal and breath sounds normal. No respiratory distress. He has  no wheezes. He has no rales.  Abdominal: Soft. Bowel sounds are normal. He exhibits no distension and no mass. There is tenderness in the right lower quadrant. There is no rebound and no guarding.  Musculoskeletal: Normal range of motion. He exhibits no edema or tenderness.  Lymphadenopathy:    He has no cervical adenopathy.    He has no axillary adenopathy.       Right: No inguinal and no supraclavicular adenopathy present.       Left: No inguinal and no supraclavicular adenopathy present.  Neurological: He is alert and oriented to person, place, and time.  Skin: Skin is warm and dry. No rash noted. No erythema.  Psychiatric: Mood, affect and judgment normal.  Nursing note and vitals reviewed.   Admission on 06/15/2018, Discharged on 06/15/2018  Component Date Value Ref Range Status  . Lipase 06/15/2018 24  11 - 51 U/L Final   Performed at [facility], [address] [place], [place] [address]  . Sodium 06/15/2018 134* 135 - 145 mmol/L Final  . Potassium 06/15/2018 3.9  3.5 - 5.1 mmol/L Final  .  Chloride 06/15/2018 99  98 - 111 mmol/L Final  . CO2 06/15/2018 25  22 - 32 mmol/L Final  . Glucose, Bld 06/15/2018 109* 70 - 99 mg/dL Final  . BUN 06/15/2018 13  8 - 23 mg/dL Final  . Creatinine, Ser 06/15/2018 1.10  0.61 - 1.24 mg/dL Final  . Calcium 06/15/2018 9.8  8.9 - 10.3 mg/dL Final  . Total Protein 06/15/2018 7.1  6.5 - 8.1 g/dL Final  . Albumin 06/15/2018 4.4  3.5 - 5.0 g/dL Final  . AST 06/15/2018 30  15 - 41 U/L Final  . ALT 06/15/2018 35  0 - 44 U/L Final  . Alkaline Phosphatase 06/15/2018 34* 38 - 126 U/L Final  . Total Bilirubin 06/15/2018 0.7  0.3 - 1.2 mg/dL Final  . GFR calc non Af Amer 06/15/2018 60* >60 mL/min Final  . GFR calc Af Amer 06/15/2018 >60  >60 mL/min Final   Comment: (NOTE) The eGFR has been calculated using the CKD EPI equation. This calculation has not been validated in all clinical situations. eGFR's persistently <60 mL/min signify possible Chronic Kidney Disease.   Georgiann Hahn gap 06/15/2018 10  5 - 15 Final   Performed at [facility], [address] [place], [place] [address]  . WBC 06/15/2018 4.2  3.8 - 10.6 K/uL Final  . RBC 06/15/2018 4.11* 4.40 - 5.90 MIL/uL Final  . Hemoglobin 06/15/2018 13.0  13.0 - 18.0 g/dL Final  . HCT 06/15/2018 37.9* 40.0 - 52.0 % Final  . MCV 06/15/2018 92.1  80.0 - 100.0 fL Final  . MCH 06/15/2018 31.5  26.0 - 34.0 pg Final  . MCHC 06/15/2018 34.2  32.0 - 36.0 g/dL Final  . RDW 06/15/2018 13.4  11.5 - 14.5 % Final  . Platelets 06/15/2018 241  150 - 440 K/uL Final   Performed at [facility], [address] [place],  [address]  . Color, Urine 06/15/2018 YELLOW* YELLOW Final  . APPearance 06/15/2018 CLEAR* CLEAR Final  . Specific Gravity, Urine 06/15/2018 1.008  1.005 - 1.030 Final  . pH 06/15/2018 6.0  5.0 - 8.0 Final  . Glucose, UA 06/15/2018 NEGATIVE  NEGATIVE mg/dL Final  . Hgb urine dipstick 06/15/2018 NEGATIVE  NEGATIVE Final  . Bilirubin Urine 06/15/2018 NEGATIVE  NEGATIVE Final  .  Ketones, ur 06/15/2018 NEGATIVE  NEGATIVE mg/dL Final  .  Protein, ur 06/15/2018 NEGATIVE  NEGATIVE mg/dL Final  . Nitrite 06/15/2018 NEGATIVE  NEGATIVE Final  . Leukocytes, UA 06/15/2018 NEGATIVE  NEGATIVE Final  . RBC / HPF 06/15/2018 0-5  0 - 5 RBC/hpf Final  . WBC, UA 06/15/2018 0-5  0 - 5 WBC/hpf Final  . Bacteria, UA 06/15/2018 NONE SEEN  NONE SEEN Final  . Squamous Epithelial / LPF 06/15/2018 NONE SEEN  0 - 5 Final   Performed at [facility], [address], [place], [place] [address]  . TSH 06/15/2018 1.946  0.350 - 4.500 uIU/mL Final   Comment: Performed by a 3rd Generation assay with a functional sensitivity of <=0.01 uIU/mL. Performed at [facility], [address] [place], [place] [address]     Assessment:  BRITTIN JANIK is a 84 y.o. male with an abnormal CT scan worrisome for metastatic colon cancer.  He presented with a several month history of lower abdominal discomfort and weight loss.  Abdomen and pelvis CT on 06/15/2018 revealed several ill-defined areas of hypoattenuation within the liver, the most prominent in the falciform ligament measures 3.2 cm. These may represent areas of metastatic disease versus liver abscesses.  There was an indeterminate 3.2 cm left adrenal mass ands an indeterminate 1.4 cm right adrenal nodule.  There was relatively short segment of irregular mucosal thickening of the cecum.  There was enlargement of the prostate gland.  CXR on 06/15/2018 revealed no active cardiopulmonary process.  Colonoscopy on 08/27/2011 revealed 2 sessile polyps in the splenic flexure and in the cecum.  Pathology revealed prominent lymphoid aggregate and melanosis coli in the cecum and a tubular adenoma in the splenic flexure.   He has a history of BPH.  PSA was 1.18 on 11/19/2016.  Symptomatically, he has RLQ pain.  Appetite has decreased.  He has intermittent episodes of nausea. He has lost "about 20 some pounds" since 04/2018. Stools are dark in  color with intermittent episodes of BRBPR.  Plan: 1.  Labs today:  CEA, PSA, PT, PTT. 2.  Liver lesions and irregular cecum::  Review imaging with patient.  Imaging personally reviewed.  Agree with radiology interpretation.  Clinically, no concern for abscess.  Discuss concern for metastatic colon cancer secondary to cecal thickening, liver lesions, and weight loss.  Discuss referral to GI for colonoscopy to assess cecum.  Discuss plan for likely PET imaging and liver biopsy following GI evaluation.   3. RTC after GI consult - will call patient for an appointment.    Honor Loh, NP  06/17/2018, 9:44 AM   I saw and evaluated the patient, participating in the key portions of the service and reviewing pertinent diagnostic studies and records.  I reviewed the nurse practitioner's note and agree with the findings and the plan.  The assessment and plan were discussed with the patient.  Additional diagnostic studies of colonoscopy are needed to clarify cecal changes notes on CT scan.  Multiple questions were asked by the patient and answered.   Nolon Stalls, MD 06/17/2018, 9:44 AM

## 2018-06-17 NOTE — Progress Notes (Signed)
Met with Mr. Cambria and his spouse, [name], before and during consult with Dr. Mike Gip. Introduced Therapist, nutritional and provided contact information for future needs. Colonoscopy being arranged for this week with [facility]. They will call him with date/time/instructions. Oncology Nurse Navigator Documentation  Navigator Location: [facility]Med Onc (06/17/18 1000)   )Navigator Encounter Type: Initial MedOnc (06/17/18 1000)                     Patient Visit Type: MedOnc;Initial (06/17/18 1000) Treatment Phase: Abnormal Scans (06/17/18 1000) Barriers/Navigation Needs: No barriers at this time;Coordination of Care (06/17/18 1000)   Interventions: Coordination of Care (06/17/18 1000)                      Time Spent with Patient: 30 (06/17/18 1000)

## 2018-06-17 NOTE — Progress Notes (Signed)
Name: Eugene Murphy   MRN: [id number]    DOB: 12-Sep-1932   Date:06/17/2018       Progress Note  Subjective  Chief Complaint  Chief Complaint  Patient presents with  . ER f/u    HPI  ER follow up: he went to [facility] 06/15/2018 because is worried about the abrupt weight loss of about 30 lbs in 3 months. He also noticed change in appetite, early satiety, nausea but no vomiting. Also noticed a change in bowel movements, used to take miralax about once a month in the past, but recently has been taking daily to be able to have a bowel movement. Stools are dark brown but bright blood in stools. Sometimes has to strain. He states he has noticed abdominal bloating/discomfort on right lower quadrant ( but denied pain when he went to [facility] ). He was seen by Dr. Mike Gip this am, had labs done and will get referral to see GI for colonoscopy asap.  CT reviewed again with patient   Patient Active Problem List   Diagnosis Date Noted  . Colon wall thickening 06/17/2018  . Lesion of liver 06/17/2018  . OSA on CPAP 12/31/2017  . GERD (gastroesophageal reflux disease) 07/25/2016  . Environmental and seasonal allergies 02/29/2016  . Elevated PSA 11/02/2015  . Hyperlipidemia 04/13/2015  . Hypertension 04/06/2015  . BPH with obstruction/lower urinary tract symptoms 04/06/2015  . Hypersomnia with sleep apnea 12/21/2013  . Asthma, mild intermittent 12/21/2013  . Chronic obstructive airway disease with asthma ([place]) 12/21/2013    Past Surgical History:  Procedure Laterality Date  . COSMETIC SURGERY Left    Left Arm Plastic Surgery in 1970s    Family History  Problem Relation Age of Onset  . Congestive Heart Failure Father   . Healthy Sister   . Dementia Brother     Social History   Socioeconomic History  . Marital status: Married    Spouse name: Flora  . Number of children: 3  . Years of education: Not on file  . Highest education level: 9th grade  Occupational History  . Occupation: cab  drivers    Comment: retired many years ago   Social Needs  . Financial resource strain: Not hard at all  . Food insecurity:    Worry: Never true    Inability: Never true  . Transportation needs:    Medical: No    Non-medical: Not on file  Tobacco Use  . Smoking status: Never Smoker  . Smokeless tobacco: Never Used  Substance and Sexual Activity  . Alcohol use: Yes    Alcohol/week: 0.0 standard drinks    Comment: Occasional  . Drug use: No  . Sexual activity: Yes    Partners: Female  Lifestyle  . Physical activity:    Days per week: 0 days    Minutes per session: 0 min  . Stress: Not at all  Relationships  . Social connections:    Talks on phone: Once a week    Gets together: More than three times a week    Attends religious service: More than 4 times per year    Active member of club or organization: No    Attends meetings of clubs or organizations: Never    Relationship status: Married  . Intimate partner violence:    Fear of current or ex partner: No    Emotionally abused: No    Physically abused: No    Forced sexual activity: No  Other Topics Concern  .  Not on file  Social History Narrative   He had 3 children prior to getting married, one died in a motorcycle accident    He has two grown children in [place] and one in [place] here from [place] after retirement      Current Outpatient Medications:  .  Albuterol Sulfate 108 (90 Base) MCG/ACT AEPB, , Disp: , Rfl:  .  Ascorbic Acid (VITAMIN C) 1000 MG tablet, Take by mouth., Disp: , Rfl:  .  ASMANEX 30 METERED DOSES 220 MCG/INH inhaler, , Disp: , Rfl:  .  azelastine (ASTELIN) 0.1 % nasal spray, Place into the nose., Disp: , Rfl:  .  Cholecalciferol (VITAMIN D-3) 5000 units TABS, Take by mouth daily., Disp: , Rfl:  .  doxazosin (CARDURA) 8 MG tablet, Take 1 tablet (8 mg total) by mouth daily., Disp: 90 tablet, Rfl: 3 .  dutasteride (AVODART) 0.5 MG capsule, Take 1 capsule (0.5 mg total) by mouth daily.,  Disp: 90 capsule, Rfl: 3 .  enalapril (VASOTEC) 10 MG tablet, Take 1 tablet (10 mg total) by mouth daily., Disp: 90 tablet, Rfl: 1 .  fluticasone (FLONASE) 50 MCG/ACT nasal spray, , Disp: , Rfl:  .  lansoprazole (PREVACID) 30 MG capsule, Take 1 capsule (30 mg total) by mouth daily., Disp: 90 capsule, Rfl: 1 .  latanoprost (XALATAN) 0.005 % ophthalmic solution, Place 1 drop into both eyes at bedtime. , Disp: , Rfl:  .  levocetirizine (XYZAL) 5 MG tablet, Take 1 tablet (5 mg total) by mouth every evening., Disp: 90 tablet, Rfl: 1 .  ondansetron (ZOFRAN ODT) 4 MG disintegrating tablet, Take 1 tablet (4 mg total) by mouth every 6 (six) hours as needed for nausea or vomiting., Disp: 20 tablet, Rfl: 0 .  Potassium 99 MG TABS, Take 1 tablet by mouth daily at 8 pm., Disp: , Rfl:  .  pravastatin (PRAVACHOL) 40 MG tablet, Take 1 tablet (40 mg total) by mouth daily., Disp: 90 tablet, Rfl: 0 .  traZODone (DESYREL) 50 MG tablet, Take 0.5-2 tablets (25-100 mg total) by mouth at bedtime as needed for sleep., Disp: 60 tablet, Rfl: 0  Allergies  Allergen Reactions  . Shellfish Allergy Swelling    Throat swells     ROS  Ten systems reviewed and is negative except as mentioned in HPI   Objective  Vitals:   06/17/18 1059  BP: 138/78  Pulse: (!) 107  Resp: 16  Temp: 98.2 F (36.8 C)  TempSrc: Oral  SpO2: 99%  Weight: 218 lb 14.4 oz (99.3 kg)  Height: 5' 8.5" (1.74 m)    Body mass index is 32.8 kg/m.  Physical Exam  Constitutional: Patient appears well-developed and well-nourished. Obese  No distress.  HEENT: head atraumatic, normocephalic, pupils equal and reactive to light,  neck supple, throat within normal limits Cardiovascular: Normal rate, regular rhythm and normal heart sounds.  No murmur heard. No BLE edema. Pulmonary/Chest: Effort normal and breath sounds normal. No respiratory distress. Abdominal: Soft.  There is  Tenderness right lower quadrant, no guarding or rebound  . Psychiatric: Patient has a normal mood and affect. behavior is normal. Judgment and thought content normal.  Recent Results (from the past 2160 hour(s))  Urinalysis, Complete     Status: Abnormal   Collection Time: 06/11/18  2:25 PM  Result Value Ref Range   Specific Gravity, UA 1.015 1.005 - 1.030   pH, UA 5.5 5.0 - 7.5   Color, UA Yellow Yellow  Appearance Ur Clear Clear   Leukocytes, UA Negative Negative   Protein, UA Trace (A) Negative/Trace   Glucose, UA Negative Negative   Ketones, UA 1+ (A) Negative   RBC, UA Negative Negative   Bilirubin, UA Negative Negative   Urobilinogen, Ur 0.2 0.2 - 1.0 mg/dL   Nitrite, UA Negative Negative   Microscopic Examination See below:   BLADDER SCAN AMB NON-IMAGING     Status: None   Collection Time: 06/11/18  2:25 PM  Result Value Ref Range   Scan Result 16m   Microscopic Examination     Status: Abnormal   Collection Time: 06/11/18  2:25 PM  Result Value Ref Range   WBC, UA None seen 0 - 5 /hpf   RBC, UA None seen 0 - 2 /hpf   Epithelial Cells (non renal) None seen 0 - 10 /hpf   Casts Present (A) None seen /lpf   Cast Type Hyaline casts N/A   Mucus, UA Present (A) Not Estab.   Bacteria, UA Moderate (A) None seen/Few  Comprehensive metabolic panel     Status: Abnormal   Collection Time: 06/11/18  3:00 PM  Result Value Ref Range   Glucose 101 (H) 65 - 99 mg/dL   BUN 12 8 - 27 mg/dL   Creatinine, Ser 1.26 0.76 - 1.27 mg/dL   GFR calc non Af Amer 52 (L) >59 mL/min/1.73   GFR calc Af Amer 60 >59 mL/min/1.73   BUN/Creatinine Ratio 10 10 - 24   Sodium 135 134 - 144 mmol/L   Potassium 4.3 3.5 - 5.2 mmol/L   Chloride 97 96 - 106 mmol/L   CO2 21 20 - 29 mmol/L   Calcium 10.4 (H) 8.6 - 10.2 mg/dL   Total Protein 7.1 6.0 - 8.5 g/dL   Albumin 4.7 3.5 - 4.7 g/dL   Globulin, Total 2.4 1.5 - 4.5 g/dL   Albumin/Globulin Ratio 2.0 1.2 - 2.2   Bilirubin Total 0.6 0.0 - 1.2 mg/dL   Alkaline Phosphatase 42 39 - 117 IU/L   AST 28 0 - 40 IU/L    ALT 36 0 - 44 IU/L  CBC With differential/Platelet     Status: Abnormal   Collection Time: 06/11/18  3:00 PM  Result Value Ref Range   WBC 6.4 3.4 - 10.8 x10E3/uL   RBC 3.98 (L) 4.14 - 5.80 x10E6/uL   Hemoglobin 12.5 (L) 13.0 - 17.7 g/dL   Hematocrit 36.2 (L) 37.5 - 51.0 %   MCV 91 79 - 97 fL   MCH 31.4 26.6 - 33.0 pg   MCHC 34.5 31.5 - 35.7 g/dL   RDW 12.3 12.3 - 15.4 %   Neutrophils 73 Not Estab. %   Lymphs 16 Not Estab. %   Monocytes 11 Not Estab. %   Eos 0 Not Estab. %   Basos 0 Not Estab. %   Neutrophils Absolute 4.7 1.4 - 7.0 x10E3/uL   Lymphocytes Absolute 1.0 0.7 - 3.1 x10E3/uL   Monocytes Absolute 0.7 0.1 - 0.9 x10E3/uL   EOS (ABSOLUTE) 0.0 0.0 - 0.4 x10E3/uL   Basophils Absolute 0.0 0.0 - 0.2 x10E3/uL   Immature Granulocytes 0 Not Estab. %   Immature Grans (Abs) 0.0 0.0 - 0.1 x10E3/uL  Lipase, blood     Status: None   Collection Time: 06/15/18  9:11 AM  Result Value Ref Range   Lipase 24 11 - 51 U/L    Comment: Performed at [facility] [address] [place] [address]  Comprehensive metabolic panel     Status: Abnormal   Collection Time: 06/15/18  9:11 AM  Result Value Ref Range   Sodium 134 (L) 135 - 145 mmol/L   Potassium 3.9 3.5 - 5.1 mmol/L   Chloride 99 98 - 111 mmol/L   CO2 25 22 - 32 mmol/L   Glucose, Bld 109 (H) 70 - 99 mg/dL   BUN 13 8 - 23 mg/dL   Creatinine, Ser 1.10 0.61 - 1.24 mg/dL   Calcium 9.8 8.9 - 10.3 mg/dL   Total Protein 7.1 6.5 - 8.1 g/dL   Albumin 4.4 3.5 - 5.0 g/dL   AST 30 15 - 41 U/L   ALT 35 0 - 44 U/L   Alkaline Phosphatase 34 (L) 38 - 126 U/L   Total Bilirubin 0.7 0.3 - 1.2 mg/dL   GFR calc non Af Amer 60 (L) >60 mL/min   GFR calc Af Amer >60 >60 mL/min    Comment: (NOTE) The eGFR has been calculated using the CKD EPI equation. This calculation has not been validated in all clinical situations. eGFR's persistently <60 mL/min signify possible Chronic Kidney Disease.    Anion gap 10 5 - 15    Comment:  Performed at [facility], [address] [place], [place] [address]  CBC     Status: Abnormal   Collection Time: 06/15/18  9:11 AM  Result Value Ref Range   WBC 4.2 3.8 - 10.6 K/uL   RBC 4.11 (L) 4.40 - 5.90 MIL/uL   Hemoglobin 13.0 13.0 - 18.0 g/dL   HCT 37.9 (L) 40.0 - 52.0 %   MCV 92.1 80.0 - 100.0 fL   MCH 31.5 26.0 - 34.0 pg   MCHC 34.2 32.0 - 36.0 g/dL   RDW 13.4 11.5 - 14.5 %   Platelets 241 150 - 440 K/uL    Comment: Performed at [facility], [address] [place], [place] [address]  TSH     Status: None   Collection Time: 06/15/18  9:11 AM  Result Value Ref Range   TSH 1.946 0.350 - 4.500 uIU/mL    Comment: Performed by a 3rd Generation assay with a functional sensitivity of <=0.01 uIU/mL. Performed at [facility], [address] [place], [place] [address]   Urinalysis, Complete w Microscopic     Status: Abnormal   Collection Time: 06/15/18  9:12 AM  Result Value Ref Range   Color, Urine YELLOW (A) YELLOW   APPearance CLEAR (A) CLEAR   Specific Gravity, Urine 1.008 1.005 - 1.030   pH 6.0 5.0 - 8.0   Glucose, UA NEGATIVE NEGATIVE mg/dL   Hgb urine dipstick NEGATIVE NEGATIVE   Bilirubin Urine NEGATIVE NEGATIVE   Ketones, ur NEGATIVE NEGATIVE mg/dL   Protein, ur NEGATIVE NEGATIVE mg/dL   Nitrite NEGATIVE NEGATIVE   Leukocytes, UA NEGATIVE NEGATIVE   RBC / HPF 0-5 0 - 5 RBC/hpf   WBC, UA 0-5 0 - 5 WBC/hpf   Bacteria, UA NONE SEEN NONE SEEN   Squamous Epithelial / LPF NONE SEEN 0 - 5    Comment: Performed at [facility], [address] [place], [place] [address]  APTT     Status: None   Collection Time: 06/17/18 10:19 AM  Result Value Ref Range   aPTT 32 24 - 36 seconds    Comment: Performed at [facility], [address], [place], [place] [address]  Protime-INR     Status: None   Collection Time:  06/17/18 10:19 AM  Result Value Ref Range   Prothrombin Time 13.8 11.4 - 15.2 seconds   INR 1.07      Comment: Performed at [facility], [address] [place],  [address]      PHQ2/9: Depression screen [facility] 2/9 06/17/2018 12/31/2017 10/02/2017 06/19/2017 03/19/2017  Decreased Interest 0 0 0 0 0  Down, Depressed, Hopeless 0 0 0 0 0  PHQ - 2 Score 0 0 0 0 0  Altered sleeping 0 - - - -  Tired, decreased energy 0 - - - -  Change in appetite 3 - - - -  Feeling bad or failure about yourself  0 - - - -  Trouble concentrating 0 - - - -  Moving slowly or fidgety/restless 0 - - - -  Suicidal thoughts 0 - - - -  PHQ-9 Score 3 - - - -  Difficult doing work/chores Not difficult at all - - - -    GAD 7 : Generalized Anxiety Score 06/17/2018  Nervous, Anxious, on Edge 0  Control/stop worrying 0  Worry too much - different things 0  Trouble relaxing 0  Restless 0  Easily annoyed or irritable 0  Afraid - awful might happen 0  Total GAD 7 Score 0  Anxiety Difficulty Not difficult at all    Fall Risk: Fall Risk  06/17/2018 12/31/2017 10/02/2017 06/19/2017 03/19/2017  Falls in the past year? _0      Functional Status Survey: Is the patient deaf or have difficulty hearing?: No Does the patient have difficulty seeing, even when wearing glasses/contacts?: No Does the patient have difficulty concentrating, remembering, or making decisions?: No Does the patient have difficulty walking or climbing stairs?: Yes Does the patient have difficulty dressing or bathing?: No Does the patient have difficulty doing errands alone such as visiting a doctor's office or shopping?: Yes    Assessment & Plan  1. Colon wall thickening  Seeing hematologist   2. Lesion of liver  Either abscess of mets  3. Nausea  Fill rx given by GI  4. Weight loss   5. Other insomnia  - traZODone (DESYREL) 50 MG tablet; Take 0.5-2 tablets (25-100 mg total) by mouth at bedtime as needed for sleep.  Dispense: 60 tablet; Refill: 0  6. Needs flu shot  - Flu vaccine HIGH DOSE PF

## 2018-06-17 NOTE — Telephone Encounter (Signed)
Left vm for pt to return my call to schedule a colonoscopy with Dr. Allen Norris on Thurs or Friday of this week.

## 2018-06-17 NOTE — Progress Notes (Signed)
Patient is here today as new evaluation regarding possible cancer diagnosis.  Referred by ED.

## 2018-06-18 ENCOUNTER — Encounter: Payer: Self-pay | Admitting: *Deleted

## 2018-06-18 ENCOUNTER — Other Ambulatory Visit: Payer: Self-pay

## 2018-06-18 DIAGNOSIS — K639 Disease of intestine, unspecified: Secondary | ICD-10-CM

## 2018-06-18 LAB — CEA: CEA: 3.3 ng/mL (ref 0.0–4.7)

## 2018-06-18 NOTE — Progress Notes (Signed)
amb  

## 2018-06-18 NOTE — Telephone Encounter (Signed)
Pt's wife returned my call and we have scheduled colonoscopy with Dr. Allen Norris on 06/19/18.

## 2018-06-18 NOTE — Discharge Instructions (Signed)
General Anesthesia, Adult, Care After °These instructions provide you with information about caring for yourself after your procedure. Your health care provider may also give you more specific instructions. Your treatment has been planned according to current medical practices, but problems sometimes occur. Call your health care provider if you have any problems or questions after your procedure. °What can I expect after the procedure? °After the procedure, it is common to have: °· Vomiting. °· A sore throat. °· Mental slowness. ° °It is common to feel: °· Nauseous. °· Cold or shivery. °· Sleepy. °· Tired. °· Sore or achy, even in parts of your body where you did not have surgery. ° °Follow these instructions at home: °For at least 24 hours after the procedure: °· Do not: °? Participate in activities where you could fall or become injured. °? Drive. °? Use heavy machinery. °? Drink alcohol. °? Take sleeping pills or medicines that cause drowsiness. °? Make important decisions or sign legal documents. °? Take care of children on your own. °· Rest. °Eating and drinking °· If you vomit, drink water, juice, or soup when you can drink without vomiting. °· Drink enough fluid to keep your urine clear or pale yellow. °· Make sure you have little or no nausea before eating solid foods. °· Follow the diet recommended by your health care provider. °General instructions °· Have a responsible adult stay with you until you are awake and alert. °· Return to your normal activities as told by your health care provider. Ask your health care provider what activities are safe for you. °· Take over-the-counter and prescription medicines only as told by your health care provider. °· If you smoke, do not smoke without supervision. °· Keep all follow-up visits as told by your health care provider. This is important. °Contact a health care provider if: °· You continue to have nausea or vomiting at home, and medicines are not helpful. °· You  cannot drink fluids or start eating again. °· You cannot urinate after 8-12 hours. °· You develop a skin rash. °· You have fever. °· You have increasing redness at the site of your procedure. °Get help right away if: °· You have difficulty breathing. °· You have chest pain. °· You have unexpected bleeding. °· You feel that you are having a life-threatening or urgent problem. °This information is not intended to replace advice given to you by your health care provider. Make sure you discuss any questions you have with your health care provider. °Document Released: 12/31/2000 Document Revised: 02/27/2016 Document Reviewed: 09/08/2015 °Elsevier Interactive Patient Education © 2018 [employer] ° °

## 2018-06-19 ENCOUNTER — Ambulatory Visit: Payer: Medicare Other | Admitting: Anesthesiology

## 2018-06-19 ENCOUNTER — Ambulatory Visit
Admission: RE | Admit: 2018-06-19 | Discharge: 2018-06-19 | Disposition: A | Discharge status: Home / Self Care | Payer: Medicare Other | Source: Ambulatory Visit | Attending: Gastroenterology | Admitting: Gastroenterology

## 2018-06-19 ENCOUNTER — Encounter
Admission: RE | Disposition: A | Discharge status: Home / Self Care | Payer: Self-pay | Source: Ambulatory Visit | Attending: Gastroenterology

## 2018-06-19 DIAGNOSIS — E785 Hyperlipidemia, unspecified: Secondary | ICD-10-CM | POA: Diagnosis not present

## 2018-06-19 DIAGNOSIS — Z91013 Allergy to seafood: Secondary | ICD-10-CM | POA: Insufficient documentation

## 2018-06-19 DIAGNOSIS — M47816 Spondylosis without myelopathy or radiculopathy, lumbar region: Secondary | ICD-10-CM | POA: Insufficient documentation

## 2018-06-19 DIAGNOSIS — J449 Chronic obstructive pulmonary disease, unspecified: Secondary | ICD-10-CM | POA: Diagnosis not present

## 2018-06-19 DIAGNOSIS — K635 Polyp of colon: Secondary | ICD-10-CM

## 2018-06-19 DIAGNOSIS — Z9989 Dependence on other enabling machines and devices: Secondary | ICD-10-CM | POA: Diagnosis not present

## 2018-06-19 DIAGNOSIS — I10 Essential (primary) hypertension: Secondary | ICD-10-CM | POA: Diagnosis not present

## 2018-06-19 DIAGNOSIS — K219 Gastro-esophageal reflux disease without esophagitis: Secondary | ICD-10-CM | POA: Insufficient documentation

## 2018-06-19 DIAGNOSIS — D122 Benign neoplasm of ascending colon: Secondary | ICD-10-CM | POA: Insufficient documentation

## 2018-06-19 DIAGNOSIS — R933 Abnormal findings on diagnostic imaging of other parts of digestive tract: Secondary | ICD-10-CM

## 2018-06-19 DIAGNOSIS — Z79899 Other long term (current) drug therapy: Secondary | ICD-10-CM | POA: Insufficient documentation

## 2018-06-19 DIAGNOSIS — K640 First degree hemorrhoids: Secondary | ICD-10-CM | POA: Diagnosis not present

## 2018-06-19 DIAGNOSIS — D125 Benign neoplasm of sigmoid colon: Secondary | ICD-10-CM

## 2018-06-19 DIAGNOSIS — G4730 Sleep apnea, unspecified: Secondary | ICD-10-CM | POA: Diagnosis not present

## 2018-06-19 DIAGNOSIS — N400 Enlarged prostate without lower urinary tract symptoms: Secondary | ICD-10-CM | POA: Diagnosis not present

## 2018-06-19 HISTORY — DX: Sleep apnea, unspecified: G47.30

## 2018-06-19 HISTORY — DX: Unspecified osteoarthritis, unspecified site: M19.90

## 2018-06-19 HISTORY — PX: COLONOSCOPY WITH PROPOFOL: SHX5780

## 2018-06-19 SURGERY — COLONOSCOPY WITH PROPOFOL
Anesthesia: General | Site: Rectum

## 2018-06-19 MED ORDER — PROPOFOL 10 MG/ML IV BOLUS
INTRAVENOUS | Status: DC | PRN
  Administered 2018-06-19: 20 mg via INTRAVENOUS
  Administered 2018-06-19: 70 mg via INTRAVENOUS
  Administered 2018-06-19 (×4): 20 mg via INTRAVENOUS
  Administered 2018-06-19: 30 mg via INTRAVENOUS

## 2018-06-19 MED ORDER — LACTATED RINGERS IV SOLN: 10.0000 mL/h | INTRAVENOUS | Status: DC

## 2018-06-19 MED ORDER — LIDOCAINE HCL (CARDIAC) PF 100 MG/5ML IV SOSY
PREFILLED_SYRINGE | INTRAVENOUS | Status: DC | PRN
  Administered 2018-06-19: 80 mg via INTRAVENOUS

## 2018-06-19 MED ORDER — LACTATED RINGERS IV SOLN
INTRAVENOUS | Status: DC | PRN
  Administered 2018-06-19: 09:00:00 via INTRAVENOUS

## 2018-06-19 MED ORDER — ONDANSETRON HCL 4 MG/2ML IJ SOLN: 4.0000 mg | Freq: Once | INTRAMUSCULAR | Status: DC | PRN

## 2018-06-19 SURGICAL SUPPLY — 7 items
CANISTER SUCT 1200ML W/VALVE (MISCELLANEOUS) ×3 IMPLANT
GOWN CVR UNV OPN BCK APRN NK (MISCELLANEOUS) ×2 IMPLANT
GOWN ISOL THUMB LOOP REG UNIV (MISCELLANEOUS) ×4
KIT ENDO PROCEDURE OLY (KITS) ×3 IMPLANT
SNARE SHORT THROW 30M LRG OVAL (MISCELLANEOUS) ×3 IMPLANT
TRAP ETRAP POLY (MISCELLANEOUS) ×3 IMPLANT
WATER STERILE IRR 250ML POUR (IV SOLUTION) ×3 IMPLANT

## 2018-06-19 NOTE — Transfer of Care (Signed)
Immediate Anesthesia Transfer of Care Note  Patient: Eugene Murphy  Procedure(s) Performed: COLONOSCOPY WITH PROPOFOL WITH BIOPSIES (N/A Rectum)  Patient Location: PACU  Anesthesia Type: General  Level of Consciousness: awake, alert  and patient cooperative  Airway and Oxygen Therapy: Patient Spontanous Breathing and Patient connected to supplemental oxygen  Post-op Assessment: Post-op Vital signs reviewed, Patient's Cardiovascular Status Stable, Respiratory Function Stable, Patent Airway and No signs of Nausea or vomiting  Post-op Vital Signs: Reviewed and stable  Complications: No apparent anesthesia complications

## 2018-06-19 NOTE — Anesthesia Preprocedure Evaluation (Signed)
Anesthesia Evaluation  Patient identified by MRN, date of birth, ID band Patient awake    Reviewed: Allergy & Precautions, H&P , NPO status , Patient's Chart, lab work & pertinent test results  Airway Mallampati: II  TM Distance: >3 FB Neck ROM: full    Dental no notable dental hx.    Pulmonary asthma , sleep apnea , COPD,    Pulmonary exam normal breath sounds clear to auscultation       Cardiovascular hypertension, Normal cardiovascular exam     Neuro/Psych    GI/Hepatic Neg liver ROS, Medicated,  Endo/Other  negative endocrine ROS  Renal/GU negative Renal ROS     Musculoskeletal   Abdominal   Peds  Hematology negative hematology ROS (+)   Anesthesia Other Findings   Reproductive/Obstetrics negative OB ROS                             Anesthesia Physical Anesthesia Plan  ASA: III  Anesthesia Plan: General   Post-op Pain Management:    Induction:   PONV Risk Score and Plan:   Airway Management Planned:   Additional Equipment:   Intra-op Plan:   Post-operative Plan:   Informed Consent: I have reviewed the patients History and Physical, chart, labs and discussed the procedure including the risks, benefits and alternatives for the proposed anesthesia with the patient or authorized representative who has indicated his/her understanding and acceptance.     Plan Discussed with:   Anesthesia Plan Comments:         Anesthesia Quick Evaluation

## 2018-06-19 NOTE — H&P (Signed)
Eugene Lame, MD [address] [place], [place] [address] Phone:[phone] Fax : [phone]  Primary Care Physician:  Steele Sizer, MD Primary Gastroenterologist:  Dr. Allen Norris  Pre-Procedure History & Physical: HPI:  Eugene Murphy is a 84 y.o. male is here for an colonoscopy.   Past Medical History:  Diagnosis Date  . Arthritis    lower back  . Asthma   . BPH (benign prostatic hyperplasia)    Followed by Bonna Gains.  Marland Kitchen GERD (gastroesophageal reflux disease)   . Hyperlipidemia   . Hypertension   . Sleep apnea    CPAP    Past Surgical History:  Procedure Laterality Date  . COSMETIC SURGERY Left    Left Arm Plastic Surgery in 1970s    Prior to Admission medications   Medication Sig Start Date End Date Taking? Authorizing Provider  Albuterol Sulfate 108 (90 Base) MCG/ACT AEPB  01/24/15  Yes [provider]  Ascorbic Acid (VITAMIN C) 1000 MG tablet Take by mouth.   Yes [provider]  [facility] 30 METERED DOSES 220 MCG/INH inhaler  01/24/15  Yes [provider]  azelastine (ASTELIN) 0.1 % nasal spray Place into the nose. 08/05/17  Yes [provider]  Cholecalciferol (VITAMIN D-3) 5000 units TABS Take by mouth daily.   Yes [provider]  doxazosin (CARDURA) 8 MG tablet Take 1 tablet (8 mg total) by mouth daily. 11/19/17  Yes Stoioff, Ronda Fairly, MD  dutasteride (AVODART) 0.5 MG capsule Take 1 capsule (0.5 mg total) by mouth daily. 11/19/17  Yes Stoioff, Ronda Fairly, MD  enalapril (VASOTEC) 10 MG tablet Take 1 tablet (10 mg total) by mouth daily. 12/31/17  Yes Sowles, Drue Stager, MD  fluticasone Asencion Islam) 50 MCG/ACT nasal spray  01/24/15  Yes [provider]  lansoprazole (PREVACID) 30 MG capsule Take 1 capsule (30 mg total) by mouth daily. 12/31/17  Yes Sowles, Drue Stager, MD  levocetirizine (XYZAL) 5 MG tablet Take 1 tablet (5 mg total) by mouth every evening. 03/26/16  Yes Ashok Norris, MD  Potassium 99 MG TABS Take 1  tablet by mouth daily at 8 pm.   Yes [provider]  pravastatin (PRAVACHOL) 40 MG tablet Take 1 tablet (40 mg total) by mouth daily. 04/21/18  Yes Sowles, Drue Stager, MD  traZODone (DESYREL) 50 MG tablet Take 0.5-2 tablets (25-100 mg total) by mouth at bedtime as needed for sleep. 06/17/18  Yes Sowles, Drue Stager, MD  latanoprost (XALATAN) 0.005 % ophthalmic solution Place 1 drop into both eyes at bedtime.  03/08/15   [provider]  Na Sulfate-K Sulfate-Mg Sulf (SUPREP BOWEL PREP KIT) 17.5-3.13-1.6 GM/177ML SOLN Take 1 kit by mouth as directed. 06/17/18   Eugene Lame, MD  ondansetron (ZOFRAN ODT) 4 MG disintegrating tablet Take 1 tablet (4 mg total) by mouth every 6 (six) hours as needed for nausea or vomiting. 06/15/18   Delman Kitten, MD    Allergies as of 06/17/2018 - Review Complete 06/17/2018  Allergen Reaction Noted  . Shellfish allergy Swelling 04/13/2015    Family History  Problem Relation Age of Onset  . Congestive Heart Failure Father   . Healthy Sister   . Dementia Brother     Social History   Socioeconomic History  . Marital status: Married    Spouse name: Flora  . Number of children: 3  . Years of education: Not on file  . Highest education level: 9th grade  Occupational History  . Occupation: cab drivers    Comment: retired  many years ago   Social Needs  . Financial resource strain: Not hard at all  . Food insecurity:    Worry: Never true    Inability: Never true  . Transportation needs:    Medical: No    Non-medical: Not on file  Tobacco Use  . Smoking status: Never Smoker  . Smokeless tobacco: Never Used  Substance and Sexual Activity  . Alcohol use: Yes    Alcohol/week: 0.0 standard drinks    Comment: Occasional  . Drug use: No  . Sexual activity: Yes    Partners: Female  Lifestyle  . Physical activity:    Days per week: 0 days    Minutes per session: 0 min  . Stress: Not at all  Relationships  . Social connections:    Talks on phone:  Once a week    Gets together: More than three times a week    Attends religious service: More than 4 times per year    Active member of club or organization: No    Attends meetings of clubs or organizations: Never    Relationship status: Married  . Intimate partner violence:    Fear of current or ex partner: No    Emotionally abused: No    Physically abused: No    Forced sexual activity: No  Other Topics Concern  . Not on file  Social History Narrative   He had 3 children prior to getting married, one died in a motorcycle accident    He has two grown children in [place] and one in [place] here from [place] after retirement     Review of Systems: See HPI, otherwise negative ROS  Physical Exam: BP 120/76   Pulse 83   Temp 97.8 F (36.6 C) (Temporal)   Ht 5' 7.5" (1.715 m)   Wt 96.2 kg   SpO2 100%   BMI 32.71 kg/m  General:   Alert,  pleasant and cooperative in NAD Head:  Normocephalic and atraumatic. Neck:  Supple; no masses or thyromegaly. Lungs:  Clear throughout to auscultation.    Heart:  Regular rate and rhythm. Abdomen:  Soft, nontender and nondistended. Normal bowel sounds, without guarding, and without rebound.   Neurologic:  Alert and  oriented x4;  grossly normal neurologically.  Impression/Plan: Eugene Murphy is here for an colonoscopy to be performed for abnormal CT scan of cecum.  Risks, benefits, limitations, and alternatives regarding  colonoscopy have been reviewed with the patient.  Questions have been answered.  All parties agreeable.   Eugene Lame, MD  06/19/2018, 8:52 AM

## 2018-06-19 NOTE — Anesthesia Postprocedure Evaluation (Signed)
Anesthesia Post Note  Patient: Eugene Murphy  Procedure(s) Performed: COLONOSCOPY WITH PROPOFOL WITH BIOPSIES (N/A Rectum)  Patient location during evaluation: PACU Anesthesia Type: General Level of consciousness: awake and alert Pain management: pain level controlled Vital Signs Assessment: post-procedure vital signs reviewed and stable Respiratory status: spontaneous breathing Cardiovascular status: blood pressure returned to baseline Anesthetic complications: no    Jaci Standard, III,  Audris Speaker D

## 2018-06-19 NOTE — Op Note (Signed)
[facility] Gastroenterology Patient Name: Eugene Murphy Procedure Date: 06/19/2018 8:46 AM MRN: [id number] Account #: [id number] Date of Birth: 1932/02/27 Admit Type: Outpatient Age: 84 Room: [facility] OR ROOM 01 Gender: Male Note Status: Finalized Procedure:            Colonoscopy Indications:          Abnormal CT of the GI tract Providers:            [name] MD, MD Referring MD:         Bethena Roys. Sowles, MD (Referring MD) Medicines:            Propofol per Anesthesia Complications:        No immediate complications. Procedure:            Pre-Anesthesia Assessment:                       - Prior to the procedure, a History and Physical was                        performed, and patient medications and allergies were                        reviewed. The patient's tolerance of previous                        anesthesia was also reviewed. The risks and benefits of                        the procedure and the sedation options and risks were                        discussed with the patient. All questions were                        answered, and informed consent was obtained. Prior                        Anticoagulants: The patient has taken no previous                        anticoagulant or antiplatelet agents. ASA Grade                        Assessment: II - A patient with mild systemic disease.                        After reviewing the risks and benefits, the patient was                        deemed in satisfactory condition to undergo the                        procedure.                       After obtaining informed consent, the colonoscope was                        passed under direct vision. Throughout the procedure,  the patient's blood pressure, pulse, and oxygen                        saturations were monitored continuously. The was                        introduced through the anus and advanced to the the   cecum, identified by appendiceal orifice and ileocecal                        valve. The colonoscopy was performed without                        difficulty. The patient tolerated the procedure well.                        The quality of the bowel preparation was excellent. Findings:      The perianal and digital rectal examinations were normal.      A 4 mm polyp was found in the ascending colon. The polyp was sessile.       The polyp was removed with a cold snare. Resection and retrieval were       complete.      A 4 mm polyp was found in the sigmoid colon. The polyp was sessile. The       polyp was removed with a cold snare. Resection and retrieval were       complete.      A few diverticula were found in the sigmoid colon.      Non-bleeding internal hemorrhoids were found during retroflexion. The       hemorrhoids were Grade I (internal hemorrhoids that do not prolapse). Impression:           - One 4 mm polyp in the ascending colon, removed with a                        cold snare. Resected and retrieved.                       - One 4 mm polyp in the sigmoid colon, removed with a                        cold snare. Resected and retrieved.                       - Diverticulosis in the sigmoid colon.                       - Non-bleeding internal hemorrhoids. Recommendation:       - Discharge patient to home.                       - Resume previous diet.                       - Continue present medications.                       - Await pathology results. Procedure Code(s):    --- Professional ---                       [phone],  Colonoscopy, flexible; with removal of tumor(s),                        polyp(s), or other lesion(s) by snare technique Diagnosis Code(s):    --- Professional ---                       R93.3, Abnormal findings on diagnostic imaging of other                        parts of digestive tract                       D12.2, Benign neoplasm of ascending colon                        D12.5, Benign neoplasm of sigmoid colon CPT copyright 2017 American Medical Association. All rights reserved. The codes documented in this report are preliminary and upon coder review may  be revised to meet current compliance requirements. [name] MD, MD 06/19/2018 9:24:12 AM This report has been signed electronically. Number of Addenda: 0 Note Initiated On: 06/19/2018 8:46 AM Scope Withdrawal Time: 0 hours 10 minutes 23 seconds  Total Procedure Duration: 0 hours 14 minutes 1 second       [facility]

## 2018-06-19 NOTE — Anesthesia Procedure Notes (Signed)
Procedure Name: MAC Date/Time: 06/19/2018 9:00 AM Performed by: Janna Arch, CRNA Pre-anesthesia Checklist: Patient identified, Emergency Drugs available, Suction available, Timeout performed and Patient being monitored Patient Re-evaluated:Patient Re-evaluated prior to induction Oxygen Delivery Method: Nasal cannula Placement Confirmation: positive ETCO2

## 2018-06-20 ENCOUNTER — Encounter: Payer: Self-pay | Admitting: Gastroenterology

## 2018-06-20 ENCOUNTER — Telehealth: Payer: Self-pay

## 2018-06-20 NOTE — Telephone Encounter (Signed)
Attempted to reach pt at [phone], no answer and no vm answered.

## 2018-06-20 NOTE — Telephone Encounter (Signed)
-----   Message from Abbie Sons, MD sent at 06/20/2018  9:20 AM EDT ----- Blood work shows no significant abnormalities.

## 2018-06-22 ENCOUNTER — Encounter: Payer: Self-pay | Admitting: Hematology and Oncology

## 2018-06-23 ENCOUNTER — Encounter: Payer: Self-pay | Admitting: Gastroenterology

## 2018-06-23 ENCOUNTER — Other Ambulatory Visit: Payer: Self-pay

## 2018-06-23 ENCOUNTER — Inpatient Hospital Stay (HOSPITAL_BASED_OUTPATIENT_CLINIC_OR_DEPARTMENT_OTHER): Payer: Medicare Other | Admitting: Hematology and Oncology

## 2018-06-23 VITALS — BP 156/81 | HR 106 | Temp 98.8°F | Resp 18 | Wt 213.7 lb

## 2018-06-23 DIAGNOSIS — R110 Nausea: Secondary | ICD-10-CM

## 2018-06-23 DIAGNOSIS — K639 Disease of intestine, unspecified: Secondary | ICD-10-CM | POA: Diagnosis not present

## 2018-06-23 DIAGNOSIS — E279 Disorder of adrenal gland, unspecified: Secondary | ICD-10-CM

## 2018-06-23 DIAGNOSIS — Z86010 Personal history of colonic polyps: Secondary | ICD-10-CM

## 2018-06-23 DIAGNOSIS — N138 Other obstructive and reflux uropathy: Secondary | ICD-10-CM | POA: Diagnosis not present

## 2018-06-23 DIAGNOSIS — I10 Essential (primary) hypertension: Secondary | ICD-10-CM

## 2018-06-23 DIAGNOSIS — R634 Abnormal weight loss: Secondary | ICD-10-CM

## 2018-06-23 DIAGNOSIS — Z7982 Long term (current) use of aspirin: Secondary | ICD-10-CM

## 2018-06-23 DIAGNOSIS — N401 Enlarged prostate with lower urinary tract symptoms: Secondary | ICD-10-CM

## 2018-06-23 DIAGNOSIS — E785 Hyperlipidemia, unspecified: Secondary | ICD-10-CM

## 2018-06-23 DIAGNOSIS — K769 Liver disease, unspecified: Secondary | ICD-10-CM

## 2018-06-23 DIAGNOSIS — M25551 Pain in right hip: Secondary | ICD-10-CM

## 2018-06-23 DIAGNOSIS — R1031 Right lower quadrant pain: Secondary | ICD-10-CM

## 2018-06-23 DIAGNOSIS — M549 Dorsalgia, unspecified: Secondary | ICD-10-CM

## 2018-06-23 DIAGNOSIS — Z79899 Other long term (current) drug therapy: Secondary | ICD-10-CM

## 2018-06-23 DIAGNOSIS — K219 Gastro-esophageal reflux disease without esophagitis: Secondary | ICD-10-CM

## 2018-06-23 DIAGNOSIS — J45909 Unspecified asthma, uncomplicated: Secondary | ICD-10-CM

## 2018-06-23 DIAGNOSIS — C787 Secondary malignant neoplasm of liver and intrahepatic bile duct: Secondary | ICD-10-CM

## 2018-06-23 DIAGNOSIS — K625 Hemorrhage of anus and rectum: Secondary | ICD-10-CM

## 2018-06-23 NOTE — Progress Notes (Signed)
Here for follow up.  Per wife-had colonoscopy last week, had lab work, saw PCP

## 2018-06-23 NOTE — Progress Notes (Signed)
[facility] day:  06/23/2018  Chief Complaint: Eugene Murphy is a 84 y.o. male with liver lesions and irregular thickening of the cecum who is seen for review of interval colonoscopy and discussion regarding direction of therapy.  HPI:  The patient was last seen in the medical oncology clinic on 06/17/2018 for initial consultation.  He had an abnormal CT scan worrisome for metastatic colon cancer secondary to irregular thickening of the cecum and liver lesions.  CEA was 3.3.  He was referred to Dr. Allen Norris.  Colonoscopy on 06/19/2018 revealed a 4 mm polyps in the ascending colon, a 4 mm polyp in the sigmoid colon and diverticulosis.  Pathology revealed tubular adenomas.  Symptomatically, he is feeling better.  He notes that the pain in his back is like a toothache.  Steroids helped his back.Marland Kitchen  He comments that after his colonoscopy, his stool was black then returned back to brown.  He has lost 5 more pounds.   Past Medical History:  Diagnosis Date  . Arthritis    lower back  . Asthma   . BPH (benign prostatic hyperplasia)    Followed by Bonna Gains.  Marland Kitchen GERD (gastroesophageal reflux disease)   . Hyperlipidemia   . Hypertension   . Sleep apnea    CPAP    Past Surgical History:  Procedure Laterality Date  . COLONOSCOPY WITH PROPOFOL N/A 06/19/2018   Procedure: COLONOSCOPY WITH PROPOFOL WITH BIOPSIES;  Surgeon: [name], MD;  Location: [facility];  Service: Endoscopy;  Laterality: N/A;  sleep apnea  . COSMETIC SURGERY Left    Left Arm Plastic Surgery in 1970s    Family History  Problem Relation Age of Onset  . Congestive Heart Failure Father   . Healthy Sister   . Dementia Brother     Social History:  reports that he has never smoked. He has never used smokeless tobacco. He reports that he drinks alcohol. He reports that he does not use drugs.  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 He moved to [place] in 1993. The patient  is accompanied by his wife Eugene Murphy) today.  Allergies:  Allergies  Allergen Reactions  . Shellfish Allergy Swelling    Throat swells    Current Medications: Current Outpatient Medications  Medication Sig Dispense Refill  . Ascorbic Acid (VITAMIN C) 1000 MG tablet Take by mouth.    . Cholecalciferol (VITAMIN D-3) 5000 units TABS Take by mouth daily.    Marland Kitchen doxazosin (CARDURA) 8 MG tablet Take 1 tablet (8 mg total) by mouth daily. 90 tablet 3  . dutasteride (AVODART) 0.5 MG capsule Take 1 capsule (0.5 mg total) by mouth daily. (Patient taking differently: Take 0.5 mg by mouth daily. ) 90 capsule 3  . enalapril (VASOTEC) 10 MG tablet Take 1 tablet (10 mg total) by mouth daily. 90 tablet 1  . lansoprazole (PREVACID) 30 MG capsule Take 1 capsule (30 mg total) by mouth daily. 90 capsule 1  . latanoprost (XALATAN) 0.005 % ophthalmic solution Place 1 drop into both eyes at bedtime.     Marland Kitchen levocetirizine (XYZAL) 5 MG tablet Take 1 tablet (5 mg total) by mouth every evening. 90 tablet 1  . Potassium 99 MG TABS Take 1 tablet by mouth daily at 8 pm.    . pravastatin (PRAVACHOL) 40 MG tablet Take 1 tablet (40 mg total) by mouth daily. 90 tablet 0  . traZODone (DESYREL) 50 MG tablet Take 0.5-2 tablets (  25-100 mg total) by mouth at bedtime as needed for sleep. 60 tablet 0  . Albuterol Sulfate 108 (90 Base) MCG/ACT AEPB     . ASMANEX 30 METERED DOSES 220 MCG/INH inhaler     . azelastine (ASTELIN) 0.1 % nasal spray Place into the nose.    . fluticasone (FLONASE) 50 MCG/ACT nasal spray     . ondansetron (ZOFRAN ODT) 4 MG disintegrating tablet Take 1 tablet (4 mg total) by mouth every 6 (six) hours as needed for nausea or vomiting. (Patient not taking: Reported on 06/23/2018) 20 tablet 0   No current facility-administered medications for this visit.     Review of Systems  Constitutional: Positive for weight loss (down 25 pounds since 04/2018). Negative for chills, diaphoresis, fever and malaise/fatigue.   HENT: Negative.  Negative for congestion, ear discharge, ear pain, nosebleeds, sinus pain and sore throat.   Eyes: Negative.  Negative for blurred vision, double vision, photophobia, pain, discharge and redness.  Respiratory: Negative.  Negative for hemoptysis and shortness of breath.   Cardiovascular: Negative.  Negative for chest pain, palpitations, leg swelling and PND.  Gastrointestinal: Negative for abdominal pain, blood in stool (dark after colonoscopy), constipation, diarrhea, nausea and vomiting.  Genitourinary: Negative.  Negative for dysuria, frequency, hematuria and urgency.       BPH  Musculoskeletal: Positive for back pain and joint pain (hip). Negative for falls and myalgias.       Right hip pain.  Skin: Negative.  Negative for itching and rash.  Neurological: Negative for dizziness, tremors, sensory change, speech change, weakness and headaches.  Endo/Heme/Allergies: Negative.  Does not bruise/bleed easily.  Psychiatric/Behavioral: Negative for depression, memory loss and suicidal ideas. The patient is not nervous/anxious and does not have insomnia.   All other systems reviewed and are negative.  Performance status (ECOG): 1 - Symptomatic but completely ambulatory  Vital Signs: BP (!) 156/81 (BP Location: Left Arm, Patient Position: Sitting)   Pulse (!) 106   Temp 98.8 F (37.1 C) (Tympanic)   Resp 18   Wt 213 lb 11.2 oz (96.9 kg)   BMI 32.98 kg/m   Physical Exam  Constitutional: He is oriented to person, place, and time and well-developed, well-nourished, and in no distress.  HENT:  Head: Normocephalic and atraumatic.  Gray hair.  Eyes: Conjunctivae and EOM are normal. No scleral icterus.  Black rimmed glasses.  Brown eyes.  Neurological: He is alert and oriented to person, place, and time.  Psychiatric: Mood, affect and judgment normal.  Nursing note and vitals reviewed.   No visits with results within 3 Day(s) from this visit.  Latest known visit with  results is:  Appointment on 06/17/2018  Component Date Value Ref Range Status  . aPTT 06/17/2018 32  24 - 36 seconds Final   Performed at [facility], [address] [place], [place] [address]  . Prothrombin Time 06/17/2018 13.8  11.4 - 15.2 seconds Final  . INR 06/17/2018 1.07   Final   Performed at [facility], [address] [place], [place] [address]  . Prostatic Specific Antigen 06/17/2018 0.43  0.00 - 4.00 ng/mL Final   Comment: (NOTE) While PSA levels of <=4.0 ng/ml are reported as reference range, some men with levels below 4.0 ng/ml can have prostate cancer and many men with PSA above 4.0 ng/ml do not have prostate cancer.  Other tests such as free PSA, age specific reference ranges, PSA velocity and PSA doubling time may be helpful especially  in men less than 2 years old. Performed at [facility], [place] [address], [place], [place] [address]   . CEA 06/17/2018 3.3  0.0 - 4.7 ng/mL Final   Comment: (NOTE)                             Nonsmokers          <3.9                             Smokers             <5.6 Roche [employer] Electrochemiluminescence Immunoassay (ECLIA) Values obtained with different assay methods or kits cannot be used interchangeably.  Results cannot be interpreted as absolute evidence of the presence or absence of malignant disease. Performed At: [facility] [place], [place] [id number] Rush Farmer MD DV:[id number]     Assessment:  KASAI BELTRAN is a 84 y.o. male with an abnormal CT scan worrisome for metastatic colon cancer.  He presented with a several month history of lower abdominal discomfort and weight loss.  CEA was 3.3 on 06/17/2018.  Abdomen and pelvis CT on 06/15/2018 revealed several ill-defined areas of hypoattenuation within the liver, the most prominent in the falciform ligament measures 3.2 cm. These may represent areas of metastatic disease versus liver abscesses.  There was  an indeterminate 3.2 cm left adrenal mass ands an indeterminate 1.4 cm right adrenal nodule.  There was relatively short segment of irregular mucosal thickening of the cecum.  There was enlargement of the prostate gland.  CXR on 06/15/2018 revealed no active cardiopulmonary process.  Colonoscopy on 08/27/2011 revealed 2 sessile polyps in the splenic flexure and in the cecum.  Pathology revealed prominent lymphoid aggregate and melanosis coli in the cecum and a tubular adenoma in the splenic flexure.  Colonoscopy on 06/19/2018 revealed a 4 mm polyps in the ascending colon, a 4 mm polyp in the sigmoid colon and diverticulosis.  Pathology revealed tubular adenomas.  He has a history of BPH.  PSA was 1.18 on 11/19/2016 and 0.43 on 06/17/2018.  Symptomatically, he notes right hip pain and back pain.  Plan: 1.  Cecal thickening:  Review interval colonoscopy- no abnormality in cecum. 2.  Liver lesions:  Etiology unclear.  Discuss consideration of liver MRI.  Patient states "I don't want to go any further". 3.  Weight loss:  Concerning 25 pound weight loss since 04/2018.  At this point patient wishes no further investigation.  Discuss RTC if he wishes to pursue investigation or follow-up MRI. 4.  RTC prn.   Lequita Asal, MD  06/23/2018, 2:07 PM

## 2018-06-25 NOTE — Telephone Encounter (Signed)
Pt informed

## 2018-07-03 ENCOUNTER — Ambulatory Visit
Admission: RE | Admit: 2018-07-03 | Discharge: 2018-07-03 | Disposition: A | Discharge status: Home / Self Care | Payer: Medicare Other | Source: Ambulatory Visit | Attending: Hematology and Oncology | Admitting: Hematology and Oncology

## 2018-07-03 DIAGNOSIS — D3502 Benign neoplasm of left adrenal gland: Secondary | ICD-10-CM | POA: Insufficient documentation

## 2018-07-03 DIAGNOSIS — D3501 Benign neoplasm of right adrenal gland: Secondary | ICD-10-CM | POA: Diagnosis not present

## 2018-07-03 DIAGNOSIS — K760 Fatty (change of) liver, not elsewhere classified: Secondary | ICD-10-CM | POA: Diagnosis not present

## 2018-07-03 DIAGNOSIS — C787 Secondary malignant neoplasm of liver and intrahepatic bile duct: Secondary | ICD-10-CM | POA: Insufficient documentation

## 2018-07-03 DIAGNOSIS — K769 Liver disease, unspecified: Secondary | ICD-10-CM | POA: Insufficient documentation

## 2018-07-03 MED ORDER — GADOBENATE DIMEGLUMINE 529 MG/ML IV SOLN
20.0000 mL | Freq: Once | INTRAVENOUS | Status: AC | PRN
  Administered 2018-07-03: 20 mL via INTRAVENOUS

## 2018-07-06 ENCOUNTER — Encounter: Payer: Self-pay | Admitting: Hematology and Oncology

## 2018-07-06 DIAGNOSIS — R634 Abnormal weight loss: Secondary | ICD-10-CM | POA: Insufficient documentation

## 2018-07-07 ENCOUNTER — Inpatient Hospital Stay (HOSPITAL_BASED_OUTPATIENT_CLINIC_OR_DEPARTMENT_OTHER): Payer: Medicare Other | Admitting: Hematology and Oncology

## 2018-07-07 VITALS — BP 143/85 | HR 94 | Temp 97.8°F | Resp 18 | Wt 204.1 lb

## 2018-07-07 DIAGNOSIS — R1031 Right lower quadrant pain: Secondary | ICD-10-CM

## 2018-07-07 DIAGNOSIS — K769 Liver disease, unspecified: Secondary | ICD-10-CM

## 2018-07-07 DIAGNOSIS — E279 Disorder of adrenal gland, unspecified: Secondary | ICD-10-CM

## 2018-07-07 DIAGNOSIS — E785 Hyperlipidemia, unspecified: Secondary | ICD-10-CM

## 2018-07-07 DIAGNOSIS — M549 Dorsalgia, unspecified: Secondary | ICD-10-CM

## 2018-07-07 DIAGNOSIS — R110 Nausea: Secondary | ICD-10-CM

## 2018-07-07 DIAGNOSIS — K625 Hemorrhage of anus and rectum: Secondary | ICD-10-CM

## 2018-07-07 DIAGNOSIS — K639 Disease of intestine, unspecified: Secondary | ICD-10-CM | POA: Diagnosis not present

## 2018-07-07 DIAGNOSIS — K219 Gastro-esophageal reflux disease without esophagitis: Secondary | ICD-10-CM

## 2018-07-07 DIAGNOSIS — E46 Unspecified protein-calorie malnutrition: Secondary | ICD-10-CM

## 2018-07-07 DIAGNOSIS — N401 Enlarged prostate with lower urinary tract symptoms: Secondary | ICD-10-CM | POA: Diagnosis not present

## 2018-07-07 DIAGNOSIS — N138 Other obstructive and reflux uropathy: Secondary | ICD-10-CM | POA: Diagnosis not present

## 2018-07-07 DIAGNOSIS — Z7982 Long term (current) use of aspirin: Secondary | ICD-10-CM

## 2018-07-07 DIAGNOSIS — M25551 Pain in right hip: Secondary | ICD-10-CM

## 2018-07-07 DIAGNOSIS — I10 Essential (primary) hypertension: Secondary | ICD-10-CM

## 2018-07-07 DIAGNOSIS — J45909 Unspecified asthma, uncomplicated: Secondary | ICD-10-CM

## 2018-07-07 DIAGNOSIS — Z79899 Other long term (current) drug therapy: Secondary | ICD-10-CM

## 2018-07-07 DIAGNOSIS — R634 Abnormal weight loss: Secondary | ICD-10-CM

## 2018-07-07 DIAGNOSIS — R933 Abnormal findings on diagnostic imaging of other parts of digestive tract: Secondary | ICD-10-CM

## 2018-07-07 NOTE — Progress Notes (Signed)
Patient accompanied by his wife today who states patient is up all night walking.  He c/o right hip pain and states he can't sleep.  He is up to the bathroom a lot at night also.  Patient states he doesn't eat much because it makes him nauseated.  Patient here today for MRI results.

## 2018-07-07 NOTE — Progress Notes (Signed)
[name] day:  07/07/2018  Chief Complaint: Eugene Murphy is a 84 y.o. male with liver lesions and irregular thickening of the cecum who is seen for review of interval colonoscopy and discussion regarding direction of therapy.  HPI:  The patient was last seen in the medical oncology clinic on 06/23/2018.  At that time, he noted right hip pain and back pain.  Colonoscopy revealed only two 4 mm polyps in the ascending and sigmoid colon (tubular adenomas).  There was no abnormality in the cecum.  We discussed liver MRI.  He declined.  MRI abdomen on 07/04/2018 was degraded by motion artifact.  There were sub-cm liver lesions too small to characterize, but statistically most likely benign cysts or hemangiomas.  Recommend follow-up abdomen MRI with and without contrast in 6 months.  During the interim, patient continues to have pain in his RIGHT hip and lower back. He denies acute symptoms today. Patient denies bleeding; no hematochezia, melena, or gross hematuria. Patient denies that he has experienced any B symptoms. He denies any interval infections.   Patient advises that he maintains an adequate appetite. He is eating less than normal per his wife. Weight today is 204 lb 1 oz (92.6 kg), which compared to his last visit to the clinic, represents a 9 pound weight loss.     Patient complains of pain rated 9/10 in the clinic today.   Past Medical History:  Diagnosis Date  . Arthritis    lower back  . Asthma   . BPH (benign prostatic hyperplasia)    Followed by Bonna Gains.  Marland Kitchen GERD (gastroesophageal reflux disease)   . Hyperlipidemia   . Hypertension   . Sleep apnea    CPAP    Past Surgical History:  Procedure Laterality Date  . COLONOSCOPY WITH PROPOFOL N/A 06/19/2018   Procedure: COLONOSCOPY WITH PROPOFOL WITH BIOPSIES;  Surgeon: [name], MD;  Location: [facility];  Service: Endoscopy;  Laterality: N/A;  sleep apnea  .  COSMETIC SURGERY Left    Left Arm Plastic Surgery in 1970s    Family History  Problem Relation Age of Onset  . Congestive Heart Failure Father   . Healthy Sister   . Dementia Brother     Social History:  reports that he has never smoked. He has never used smokeless tobacco. He reports that he drinks alcohol. He reports that he does not use drugs.  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 He moved to [place] in 1993. The patient is accompanied by his wife [name]) today.  Allergies:  Allergies  Allergen Reactions  . Shellfish Allergy Swelling    Throat swells    Current Medications: Current Outpatient Medications  Medication Sig Dispense Refill  . Albuterol Sulfate 108 (90 Base) MCG/ACT AEPB     . Ascorbic Acid (VITAMIN C) 1000 MG tablet Take by mouth.    . ASMANEX 30 METERED DOSES 220 MCG/INH inhaler     . azelastine (ASTELIN) 0.1 % nasal spray Place into the nose.    . Cholecalciferol (VITAMIN D-3) 5000 units TABS Take by mouth daily.    Marland Kitchen doxazosin (CARDURA) 8 MG tablet Take 1 tablet (8 mg total) by mouth daily. 90 tablet 3  . dutasteride (AVODART) 0.5 MG capsule Take 1 capsule (0.5 mg total) by mouth daily. (Patient taking differently: Take 0.5 mg by mouth daily. ) 90 capsule 3  . enalapril (VASOTEC) 10 MG tablet Take 1 tablet (10  mg total) by mouth daily. 90 tablet 1  . fluticasone (FLONASE) 50 MCG/ACT nasal spray     . lansoprazole (PREVACID) 30 MG capsule Take 1 capsule (30 mg total) by mouth daily. 90 capsule 1  . latanoprost (XALATAN) 0.005 % ophthalmic solution Place 1 drop into both eyes at bedtime.     Marland Kitchen levocetirizine (XYZAL) 5 MG tablet Take 1 tablet (5 mg total) by mouth every evening. 90 tablet 1  . Potassium 99 MG TABS Take 1 tablet by mouth daily at 8 pm.    . pravastatin (PRAVACHOL) 40 MG tablet Take 1 tablet (40 mg total) by mouth daily. 90 tablet 0  . traZODone (DESYREL) 50 MG tablet Take 0.5-2 tablets (25-100 mg total) by mouth at bedtime as needed for sleep.  60 tablet 0  . ondansetron (ZOFRAN ODT) 4 MG disintegrating tablet Take 1 tablet (4 mg total) by mouth every 6 (six) hours as needed for nausea or vomiting. (Patient not taking: Reported on 06/23/2018) 20 tablet 0   No current facility-administered medications for this visit.     Review of Systems  Constitutional: Positive for weight loss (down 9 pounds; appetite poor). Negative for diaphoresis, fever and malaise/fatigue.  HENT: Negative.   Eyes: Negative.   Respiratory: Negative for cough, hemoptysis, sputum production and shortness of breath.   Cardiovascular: Negative for chest pain, palpitations, orthopnea, leg swelling and PND.  Gastrointestinal: Negative for abdominal pain, blood in stool, constipation, diarrhea, melena, nausea and vomiting.       Decreased appetite.  Genitourinary: Negative for dysuria, frequency, hematuria and urgency.       BPH  Musculoskeletal: Positive for back pain and joint pain (RIGHT hip). Negative for falls and myalgias.  Skin: Negative for itching and rash.  Neurological: Negative for dizziness, tremors, weakness and headaches.  Endo/Heme/Allergies: Does not bruise/bleed easily.  Psychiatric/Behavioral: Negative for depression, memory loss and suicidal ideas. The patient is not nervous/anxious and does not have insomnia.   All other systems reviewed and are negative.  Performance status (ECOG): 1 - Symptomatic but completely ambulatory  Vital Signs: BP (!) 143/85 (BP Location: Left Arm, Patient Position: Sitting)   Pulse 94   Temp 97.8 F (36.6 C) (Tympanic)   Resp 18   Wt 204 lb 1 oz (92.6 kg)   BMI 31.49 kg/m   Physical Exam  Constitutional: He is oriented to person, place, and time and well-developed, well-nourished, and in no distress.  HENT:  Head: Normocephalic and atraumatic.  Gray hair.  Eyes: Pupils are equal, round, and reactive to light. Conjunctivae and EOM are normal. No scleral icterus.  Glasses. Brown eyes.   Neurological: He  is alert and oriented to person, place, and time.  Psychiatric: Mood, affect and judgment normal.  Nursing note and vitals reviewed.   No visits with results within 3 Day(s) from this visit.  Latest known visit with results is:  Appointment on 06/17/2018  Component Date Value Ref Range Status  . aPTT 06/17/2018 32  24 - 36 seconds Final   Performed at [facility], [address] [place], [place] [address]  . Prothrombin Time 06/17/2018 13.8  11.4 - 15.2 seconds Final  . INR 06/17/2018 1.07   Final   Performed at [facility], [address] [place], [place] [address]  . Prostatic Specific Antigen 06/17/2018 0.43  0.00 - 4.00 ng/mL Final   Comment: (NOTE) While PSA levels of <=4.0 ng/ml are reported as reference range, some men with levels  below 4.0 ng/ml can have prostate cancer and many men with PSA above 4.0 ng/ml do not have prostate cancer.  Other tests such as free PSA, age specific reference ranges, PSA velocity and PSA doubling time may be helpful especially in men less than 58 years old. Performed at [facility], [place] [address] [place], [place] [address]   . CEA 06/17/2018 3.3  0.0 - 4.7 ng/mL Final   Comment: (NOTE)                             Nonsmokers          <3.9                             Smokers             <5.6 [employer] Electrochemiluminescence Immunoassay (ECLIA) Values obtained with different assay methods or kits cannot be used interchangeably.  Results cannot be interpreted as absolute evidence of the presence or absence of malignant disease. Performed At: [facility] [place], [place] [id number] Rush Farmer MD JH:[id number]     Assessment:  Eugene Murphy is a 84 y.o. male with an abnormal CT scan worrisome for metastatic colon cancer.  He presented with a several month history of lower abdominal discomfort and weight loss.  CEA was 3.3 on 06/17/2018.  Abdomen and pelvis CT on  06/15/2018 revealed several ill-defined areas of hypoattenuation within the liver, the most prominent in the falciform ligament measures 3.2 cm. These may represent areas of metastatic disease versus liver abscesses.  There was an indeterminate 3.2 cm left adrenal mass ands an indeterminate 1.4 cm right adrenal nodule.  There was relatively short segment of irregular mucosal thickening of the cecum.  There was enlargement of the prostate gland.  CXR on 06/15/2018 revealed no active cardiopulmonary process.  Colonoscopy on 08/27/2011 revealed 2 sessile polyps in the splenic flexure and in the cecum.  Pathology revealed prominent lymphoid aggregate and melanosis coli in the cecum and a tubular adenoma in the splenic flexure.  Colonoscopy on 06/19/2018 revealed a 4 mm polyps in the ascending colon, a 4 mm polyp in the sigmoid colon and diverticulosis.  Pathology revealed tubular adenomas.  MRI abdomen on 07/04/2018 was degraded by motion artifact.  There were sub-cm liver lesions too small to characterize, but statistically most likely benign cysts or hemangiomas.  Recommend follow-up abdomen MRI with and without contrast in 6 months.  He has a history of BPH.  PSA was 1.18 on 11/19/2016 and 0.43 on 06/17/2018.  Symptomatically, patient has chronic pain in his RIGHT hip and lower back.  He continues to lose weight.  Appetite is poor. Exam is grossly unremarkable.   Plan: 1. Cecal thickening:  No abnormality noted in cecum on colonoscopy. 2. Liver lesions:  Etiology and significance unclear.  Review hepatic MRI. Imaging personally reviewed and felt to be consistent with the dictated radiology report. Imaging reviewed with patient.   Study degraded by motion artifact.  Discuss consideration of follow-up MRI in 6 months. 3. Protein calorie malnutrition  Patient continues to lose weight. His weight today is 204 lb 1 oz (92.6 kg); BMI of 31.49 kg/m. Concerning > 30 pound weight loss since  04/2018.  Encouraged him to increase his intake of calorie and protein dense food choices. Additionally, patient was encouraged to utilize nutritional supplement shakes at  least 2-3 times a day.  Discuss referral to RD to discuss weight loss.  Discuss consideration of PET scan. Wishes to meet with RD first.  4. Refer to RD 5. RTC in 1 month for MD assessment, labs (CBC with diff, CMP), and weight assessment.    Honor Loh, NP  07/07/2018, 3:36 PM   I saw and evaluated the patient, participating in the key portions of the service and reviewing pertinent diagnostic studies and records.  I reviewed the nurse practitioner's note and agree with the findings and the plan.  The assessment and plan were discussed with the patient.  Several questions were asked by the patient and answered.   Nolon Stalls, MD 07/07/2018,3:36 PM

## 2018-07-08 ENCOUNTER — Encounter: Payer: Self-pay | Admitting: Hematology and Oncology

## 2018-07-09 ENCOUNTER — Ambulatory Visit: Payer: Medicare Other | Admitting: Family Medicine

## 2018-07-09 ENCOUNTER — Encounter: Payer: Self-pay | Admitting: Family Medicine

## 2018-07-09 VITALS — BP 98/60 | HR 72 | Temp 97.9°F | Resp 16 | Ht 69.0 in | Wt 208.7 lb

## 2018-07-09 DIAGNOSIS — F323 Major depressive disorder, single episode, severe with psychotic features: Secondary | ICD-10-CM | POA: Diagnosis not present

## 2018-07-09 DIAGNOSIS — K769 Liver disease, unspecified: Secondary | ICD-10-CM

## 2018-07-09 DIAGNOSIS — N400 Enlarged prostate without lower urinary tract symptoms: Secondary | ICD-10-CM

## 2018-07-09 DIAGNOSIS — E441 Mild protein-calorie malnutrition: Secondary | ICD-10-CM

## 2018-07-09 DIAGNOSIS — K219 Gastro-esophageal reflux disease without esophagitis: Secondary | ICD-10-CM

## 2018-07-09 DIAGNOSIS — I952 Hypotension due to drugs: Secondary | ICD-10-CM | POA: Diagnosis not present

## 2018-07-09 DIAGNOSIS — J432 Centrilobular emphysema: Secondary | ICD-10-CM

## 2018-07-09 DIAGNOSIS — E785 Hyperlipidemia, unspecified: Secondary | ICD-10-CM

## 2018-07-09 DIAGNOSIS — G4709 Other insomnia: Secondary | ICD-10-CM

## 2018-07-09 DIAGNOSIS — I10 Essential (primary) hypertension: Secondary | ICD-10-CM

## 2018-07-09 DIAGNOSIS — K639 Disease of intestine, unspecified: Secondary | ICD-10-CM

## 2018-07-09 MED ORDER — PRAVASTATIN SODIUM 40 MG PO TABS: 40.0000 mg | ORAL_TABLET | Freq: Every day | ORAL | 1 refills | Status: DC

## 2018-07-09 MED ORDER — ENALAPRIL MALEATE 10 MG PO TABS: 10.0000 mg | ORAL_TABLET | Freq: Every day | ORAL | 1 refills | Status: DC

## 2018-07-09 MED ORDER — LANSOPRAZOLE 30 MG PO CPDR: 30.0000 mg | DELAYED_RELEASE_CAPSULE | Freq: Every day | ORAL | 1 refills | Status: DC

## 2018-07-09 MED ORDER — MIRTAZAPINE 15 MG PO TABS: 15.0000 mg | ORAL_TABLET | Freq: Every day | ORAL | 0 refills | Status: DC

## 2018-07-09 MED ORDER — DOXAZOSIN MESYLATE 8 MG PO TABS: 4.0000 mg | ORAL_TABLET | Freq: Every day | ORAL | 3 refills | Status: DC

## 2018-07-09 MED ORDER — CITALOPRAM HYDROBROMIDE 20 MG PO TABS: 20.0000 mg | ORAL_TABLET | Freq: Every day | ORAL | 1 refills | Status: DC

## 2018-07-09 NOTE — Patient Instructions (Addendum)
Try taking just half dose of cardura since bp is low , if bp in am is below 130/80 you can also hold dose of lisinopril   Homework:   1- be thankful to one thing every day 2- do something you like every day

## 2018-07-09 NOTE — Progress Notes (Signed)
Name: Eugene Murphy   MRN: [id number]    DOB: 10-17-1931   Date:07/09/2018       Progress Note  Subjective  Chief Complaint  Chief Complaint  Patient presents with  . Follow-up    6 mth f/u-Needs refills on Pravastatin and Pantoprazole   . Hypertension    Denies any symptoms  . Hyperlipidemia  . Sleep Apnea  . Gastroesophageal Reflux  . Asthma  . Obesity    Has losted 6 pounds since his last visit  . OA  . Anemia  . Hyperglycemia    HPI  Depression with psychotic features: his health has not been good for the past couple of months, he has been going to multiple doctors, unknown cause of weight loss. He has nausea, weight loss, fatigue. He is feeling down, cannot sleep well, pacing all the time, wife states also hallucinating at times - hearing things. He states he is tired. He does not want to go from doctor to doctor. He denies suicidal planning. They have a gun at home but wife already locked all the bullets. She states she got scared a couple of weeks ago because he did not want to lock the front door.  " someone is coming and will break down the door"  Malnutrition: he has lost another 9 lbs since his last visit, poor appetite and gets nauseated  Low bp : today in our office, we will adjust medication  Hyperlipidemia: taking medication and denies side effects, no chest pain or palpitation   Anemia: under the care of Dr. Mike Gip   OSA: wearing CPAP every night, no headache  Patient Active Problem List   Diagnosis Date Noted  . Weight loss 07/06/2018  . Abnormal computed tomography of large intestine   . Benign neoplasm of ascending colon   . Polyp of sigmoid colon   . Colon wall thickening 06/17/2018  . Lesion of liver 06/17/2018  . OSA on CPAP 12/31/2017  . GERD (gastroesophageal reflux disease) 07/25/2016  . Environmental and seasonal allergies 02/29/2016  . Elevated PSA 11/02/2015  . Hyperlipidemia 04/13/2015  . Hypertension 04/06/2015  . BPH with  obstruction/lower urinary tract symptoms 04/06/2015  . Hypersomnia with sleep apnea 12/21/2013  . Asthma, mild intermittent 12/21/2013  . Chronic obstructive airway disease with asthma ([place]) 12/21/2013    Past Surgical History:  Procedure Laterality Date  . COLONOSCOPY WITH PROPOFOL N/A 06/19/2018   Procedure: COLONOSCOPY WITH PROPOFOL WITH BIOPSIES;  Surgeon: [name], MD;  Location: [place];  Service: Endoscopy;  Laterality: N/A;  sleep apnea  . COSMETIC SURGERY Left    Left Arm Plastic Surgery in 1970s    Family History  Problem Relation Age of Onset  . Congestive Heart Failure Father   . Healthy Sister   . Dementia Brother     Social History   Socioeconomic History  . Marital status: Married    Spouse name: Flora  . Number of children: 3  . Years of education: Not on file  . Highest education level: 9th grade  Occupational History  . Occupation: cab drivers    Comment: retired many years ago   Social Needs  . Financial resource strain: Not hard at all  . Food insecurity:    Worry: Never true    Inability: Never true  . Transportation needs:    Medical: No    Non-medical: Not on file  Tobacco Use  . Smoking status: Never Smoker  . Smokeless tobacco: Never Used  Substance and Sexual Activity  . Alcohol use: Yes    Alcohol/week: 0.0 standard drinks    Comment: Occasional  . Drug use: No  . Sexual activity: Yes    Partners: Female  Lifestyle  . Physical activity:    Days per week: 0 days    Minutes per session: 0 min  . Stress: Not at all  Relationships  . Social connections:    Talks on phone: Once a week    Gets together: More than three times a week    Attends religious service: More than 4 times per year    Active member of club or organization: No    Attends meetings of clubs or organizations: Never    Relationship status: Married  . Intimate partner violence:    Fear of current or ex partner: No    Emotionally abused: No     Physically abused: No    Forced sexual activity: No  Other Topics Concern  . Not on file  Social History Narrative   He had 3 children prior to getting married, one died in a motorcycle accident    He has two grown children in [place] and one in [place] here from [place] after retirement      Current Outpatient Medications:  .  Albuterol Sulfate 108 (90 Base) MCG/ACT AEPB, , Disp: , Rfl:  .  Ascorbic Acid (VITAMIN C) 1000 MG tablet, Take by mouth., Disp: , Rfl:  .  ASMANEX 30 METERED DOSES 220 MCG/INH inhaler, , Disp: , Rfl:  .  azelastine (ASTELIN) 0.1 % nasal spray, Place into the nose., Disp: , Rfl:  .  Cholecalciferol (VITAMIN D-3) 5000 units TABS, Take by mouth daily., Disp: , Rfl:  .  doxazosin (CARDURA) 8 MG tablet, Take 1 tablet (8 mg total) by mouth daily., Disp: 90 tablet, Rfl: 3 .  dutasteride (AVODART) 0.5 MG capsule, Take 1 capsule (0.5 mg total) by mouth daily. (Patient taking differently: Take 0.5 mg by mouth daily. ), Disp: 90 capsule, Rfl: 3 .  enalapril (VASOTEC) 10 MG tablet, Take 1 tablet (10 mg total) by mouth daily., Disp: 90 tablet, Rfl: 1 .  fluticasone (FLONASE) 50 MCG/ACT nasal spray, , Disp: , Rfl:  .  lansoprazole (PREVACID) 30 MG capsule, Take 1 capsule (30 mg total) by mouth daily., Disp: 90 capsule, Rfl: 1 .  latanoprost (XALATAN) 0.005 % ophthalmic solution, Place 1 drop into both eyes at bedtime. , Disp: , Rfl:  .  levocetirizine (XYZAL) 5 MG tablet, Take 1 tablet (5 mg total) by mouth every evening., Disp: 90 tablet, Rfl: 1 .  ondansetron (ZOFRAN ODT) 4 MG disintegrating tablet, Take 1 tablet (4 mg total) by mouth every 6 (six) hours as needed for nausea or vomiting., Disp: 20 tablet, Rfl: 0 .  Potassium 99 MG TABS, Take 1 tablet by mouth daily at 8 pm., Disp: , Rfl:  .  pravastatin (PRAVACHOL) 40 MG tablet, Take 1 tablet (40 mg total) by mouth daily., Disp: 90 tablet, Rfl: 0 .  traZODone (DESYREL) 50 MG tablet, Take 0.5-2 tablets (25-100 mg  total) by mouth at bedtime as needed for sleep., Disp: 60 tablet, Rfl: 0  Allergies  Allergen Reactions  . Shellfish Allergy Swelling    Throat swells    I personally reviewed active problem list, medication list, allergies, family history, social history with the patient/caregiver today.   ROS  Constitutional: Negative for fever , positive for  weight change.  Respiratory:  Negative for cough and shortness of breath.   Cardiovascular: Negative for chest pain or palpitations.  Gastrointestinal: Negative for abdominal pain, no bowel changes.  Musculoskeletal: Positive  for gait problem or joint swelling.  Skin: Negative for rash.  Neurological: positive for dizziness but no headache.  No other specific complaints in a complete review of systems (except as listed in HPI above).  Objective  Vitals:   07/09/18 0904 07/09/18 0918  BP: 104/62 98/60  Pulse: 72   Resp: 16   Temp: 97.9 F (36.6 C)   TempSrc: Oral   SpO2: 99%   Weight: 208 lb 11.2 oz (94.7 kg)   Height: '5\' 9"'  (1.753 m)     Body mass index is 30.82 kg/m.  Physical Exam  Constitutional: Patient appears well-developed.  No distress.  HEENT: head atraumatic, normocephalic, pupils equal and reactive to light, neck supple, throat within normal limits Cardiovascular: Normal rate, regular rhythm and normal heart sounds.  No murmur heard. No BLE edema. Pulmonary/Chest: Effort normal and breath sounds normal. No respiratory distress. Abdominal: Soft.  There is no tenderness. Psychiatric: Patient has a normal mood and affect. behavior is normal. Judgment and thought content normal.   Recent Results (from the past 2160 hour(s))  Urinalysis, Complete     Status: Abnormal   Collection Time: 06/11/18  2:25 PM  Result Value Ref Range   Specific Gravity, UA 1.015 1.005 - 1.030   pH, UA 5.5 5.0 - 7.5   Color, UA Yellow Yellow   Appearance Ur Clear Clear   Leukocytes, UA Negative Negative   Protein, UA Trace (A)  Negative/Trace   Glucose, UA Negative Negative   Ketones, UA 1+ (A) Negative   RBC, UA Negative Negative   Bilirubin, UA Negative Negative   Urobilinogen, Ur 0.2 0.2 - 1.0 mg/dL   Nitrite, UA Negative Negative   Microscopic Examination See below:   BLADDER SCAN AMB NON-IMAGING     Status: None   Collection Time: 06/11/18  2:25 PM  Result Value Ref Range   Scan Result 67m   Microscopic Examination     Status: Abnormal   Collection Time: 06/11/18  2:25 PM  Result Value Ref Range   WBC, UA None seen 0 - 5 /hpf   RBC, UA None seen 0 - 2 /hpf   Epithelial Cells (non renal) None seen 0 - 10 /hpf   Casts Present (A) None seen /lpf   Cast Type Hyaline casts N/A   Mucus, UA Present (A) Not Estab.   Bacteria, UA Moderate (A) None seen/Few  Comprehensive metabolic panel     Status: Abnormal   Collection Time: 06/11/18  3:00 PM  Result Value Ref Range   Glucose 101 (H) 65 - 99 mg/dL   BUN 12 8 - 27 mg/dL   Creatinine, Ser 1.26 0.76 - 1.27 mg/dL   GFR calc non Af Amer 52 (L) >59 mL/min/1.73   GFR calc Af Amer 60 >59 mL/min/1.73   BUN/Creatinine Ratio 10 10 - 24   Sodium 135 134 - 144 mmol/L   Potassium 4.3 3.5 - 5.2 mmol/L   Chloride 97 96 - 106 mmol/L   CO2 21 20 - 29 mmol/L   Calcium 10.4 (H) 8.6 - 10.2 mg/dL   Total Protein 7.1 6.0 - 8.5 g/dL   Albumin 4.7 3.5 - 4.7 g/dL   Globulin, Total 2.4 1.5 - 4.5 g/dL   Albumin/Globulin Ratio 2.0 1.2 - 2.2   Bilirubin Total 0.6 0.0 - 1.2  mg/dL   Alkaline Phosphatase 42 39 - 117 IU/L   AST 28 0 - 40 IU/L   ALT 36 0 - 44 IU/L  CBC With differential/Platelet     Status: Abnormal   Collection Time: 06/11/18  3:00 PM  Result Value Ref Range   WBC 6.4 3.4 - 10.8 x10E3/uL   RBC 3.98 (L) 4.14 - 5.80 x10E6/uL   Hemoglobin 12.5 (L) 13.0 - 17.7 g/dL   Hematocrit 36.2 (L) 37.5 - 51.0 %   MCV 91 79 - 97 fL   MCH 31.4 26.6 - 33.0 pg   MCHC 34.5 31.5 - 35.7 g/dL   RDW 12.3 12.3 - 15.4 %   Neutrophils 73 Not Estab. %   Lymphs 16 Not Estab. %    Monocytes 11 Not Estab. %   Eos 0 Not Estab. %   Basos 0 Not Estab. %   Neutrophils Absolute 4.7 1.4 - 7.0 x10E3/uL   Lymphocytes Absolute 1.0 0.7 - 3.1 x10E3/uL   Monocytes Absolute 0.7 0.1 - 0.9 x10E3/uL   EOS (ABSOLUTE) 0.0 0.0 - 0.4 x10E3/uL   Basophils Absolute 0.0 0.0 - 0.2 x10E3/uL   Immature Granulocytes 0 Not Estab. %   Immature Grans (Abs) 0.0 0.0 - 0.1 x10E3/uL  Lipase, blood     Status: None   Collection Time: 06/15/18  9:11 AM  Result Value Ref Range   Lipase 24 11 - 51 U/L    Comment: Performed at [facility] [address] [place], [place] [address]  Comprehensive metabolic panel     Status: Abnormal   Collection Time: 06/15/18  9:11 AM  Result Value Ref Range   Sodium 134 (L) 135 - 145 mmol/L   Potassium 3.9 3.5 - 5.1 mmol/L   Chloride 99 98 - 111 mmol/L   CO2 25 22 - 32 mmol/L   Glucose, Bld 109 (H) 70 - 99 mg/dL   BUN 13 8 - 23 mg/dL   Creatinine, Ser 1.10 0.61 - 1.24 mg/dL   Calcium 9.8 8.9 - 10.3 mg/dL   Total Protein 7.1 6.5 - 8.1 g/dL   Albumin 4.4 3.5 - 5.0 g/dL   AST 30 15 - 41 U/L   ALT 35 0 - 44 U/L   Alkaline Phosphatase 34 (L) 38 - 126 U/L   Total Bilirubin 0.7 0.3 - 1.2 mg/dL   GFR calc non Af Amer 60 (L) >60 mL/min   GFR calc Af Amer >60 >60 mL/min    Comment: (NOTE) The eGFR has been calculated using the CKD EPI equation. This calculation has not been validated in all clinical situations. eGFR's persistently <60 mL/min signify possible Chronic Kidney Disease.    Anion gap 10 5 - 15    Comment: Performed at [facility], [address] [place], [place] [address]  CBC     Status: Abnormal   Collection Time: 06/15/18  9:11 AM  Result Value Ref Range   WBC 4.2 3.8 - 10.6 K/uL   RBC 4.11 (L) 4.40 - 5.90 MIL/uL   Hemoglobin 13.0 13.0 - 18.0 g/dL   HCT 37.9 (L) 40.0 - 52.0 %   MCV 92.1 80.0 - 100.0 fL   MCH 31.5 26.0 - 34.0 pg   MCHC 34.2 32.0 - 36.0 g/dL   RDW 13.4 11.5 - 14.5 %   Platelets 241 150 - 440 K/uL     Comment: Performed at [facility], [address] [place], [place] [address]  TSH  Status: None   Collection Time: 06/15/18  9:11 AM  Result Value Ref Range   TSH 1.946 0.350 - 4.500 uIU/mL    Comment: Performed by a 3rd Generation assay with a functional sensitivity of <=0.01 uIU/mL. Performed at [facility], [address] [place], [place] [address]   Urinalysis, Complete w Microscopic     Status: Abnormal   Collection Time: 06/15/18  9:12 AM  Result Value Ref Range   Color, Urine YELLOW (A) YELLOW   APPearance CLEAR (A) CLEAR   Specific Gravity, Urine 1.008 1.005 - 1.030   pH 6.0 5.0 - 8.0   Glucose, UA NEGATIVE NEGATIVE mg/dL   Hgb urine dipstick NEGATIVE NEGATIVE   Bilirubin Urine NEGATIVE NEGATIVE   Ketones, ur NEGATIVE NEGATIVE mg/dL   Protein, ur NEGATIVE NEGATIVE mg/dL   Nitrite NEGATIVE NEGATIVE   Leukocytes, UA NEGATIVE NEGATIVE   RBC / HPF 0-5 0 - 5 RBC/hpf   WBC, UA 0-5 0 - 5 WBC/hpf   Bacteria, UA NONE SEEN NONE SEEN   Squamous Epithelial / LPF NONE SEEN 0 - 5    Comment: Performed at [facility], [address] [place], [place] [address]  APTT     Status: None   Collection Time: 06/17/18 10:19 AM  Result Value Ref Range   aPTT 32 24 - 36 seconds    Comment: Performed at [facility], [address] [place], [place] [address]  Protime-INR     Status: None   Collection Time: 06/17/18 10:19 AM  Result Value Ref Range   Prothrombin Time 13.8 11.4 - 15.2 seconds   INR 1.07     Comment: Performed at [facility], [place], [place] [address]  PSA     Status: None   Collection Time: 06/17/18 10:19 AM  Result Value Ref Range   Prostatic Specific Antigen 0.43 0.00 - 4.00 ng/mL    Comment: (NOTE) While PSA levels of <=4.0 ng/ml are reported as reference range, some men with levels below 4.0 ng/ml can have prostate cancer and many men with PSA above 4.0 ng/ml do not have prostate cancer.  Other  tests such as free PSA, age specific reference ranges, PSA velocity and PSA doubling time may be helpful especially in men less than 69 years old. Performed at [facility], [place] [address], [place], [place] [address]   CEA     Status: None   Collection Time: 06/17/18 10:19 AM  Result Value Ref Range   CEA 3.3 0.0 - 4.7 ng/mL    Comment: (NOTE)                             Nonsmokers          <3.9                             Smokers             <5.6 Roche [employer] Electrochemiluminescence Immunoassay (ECLIA) Values obtained with different assay methods or kits cannot be used interchangeably.  Results cannot be interpreted as absolute evidence of the presence or absence of malignant disease. Performed At: [facility] [place], [place] [id number] Rush Farmer MD PR:[id number]      PHQ2/9: Depression screen [facility] 2/9 07/09/2018 06/17/2018 12/31/2017 10/02/2017 06/19/2017  Decreased Interest 3 0 0 0 0  Down, Depressed,  Hopeless 3 0 0 0 0  PHQ - 2 Score 6 0 0 0 0  Altered sleeping 3 0 - - -  Tired, decreased energy 3 0 - - -  Change in appetite 3 3 - - -  Feeling bad or failure about yourself  3 0 - - -  Trouble concentrating 3 0 - - -  Moving slowly or fidgety/restless 3 0 - - -  Suicidal thoughts 0 0 - - -  PHQ-9 Score 24 3 - - -  Difficult doing work/chores Extremely dIfficult Not difficult at all - - -   6CIT Screen 07/09/2018 12/17/2016  What Year? 4 points 0 points  What month? 3 points 0 points  What time? 0 points 0 points  Count back from 20 0 points 0 points  Months in reverse 4 points 4 points  Repeat phrase 10 points 2 points  Total Score 21 6      Fall Risk: Fall Risk  07/09/2018 06/17/2018 12/31/2017 10/02/2017 06/19/2017  Falls in the past year? No No No No No     Functional Status Survey: Is the patient deaf or have difficulty hearing?: No Does the patient have difficulty seeing, even when wearing glasses/contacts?:  Yes Does the patient have difficulty concentrating, remembering, or making decisions?: (S) (Discuss with doctor) Does the patient have difficulty walking or climbing stairs?: Yes Does the patient have difficulty dressing or bathing?: No Does the patient have difficulty doing errands alone such as visiting a doctor's office or shopping?: No    Assessment & Plan  1. Mild protein-calorie malnutrition ([place])  Discussed protein shakes   2. Hypotension due to drugs  Cut down cardura to half pill and hold lisinopril if bp is low in am's  3. Dyslipidemia  - pravastatin (PRAVACHOL) 40 MG tablet; Take 1 tablet (40 mg total) by mouth daily.  Dispense: 90 tablet; Refill: 1  4. Severe major depression with psychotic features (HCC)  - citalopram (CELEXA) 20 MG tablet; Take 1 tablet (20 mg total) by mouth daily.  Dispense: 30 tablet; Refill: 1 phq9 very high, wife was advised to call 911 if hallucinations gets worse or threats to hurt himself or his family  5. Essential hypertension  - enalapril (VASOTEC) 10 MG tablet; Take 1 tablet (10 mg total) by mouth daily. Hold if bp below 135/80  Dispense: 90 tablet; Refill: 1  6. Lesion of liver  He does not want to have further testing , advised to discuss in detail with his wife  7. Colon wall thickening  Colonoscopy was done   8. BPH without obstruction/lower urinary tract symptoms  - doxazosin (CARDURA) 8 MG tablet; Take 0.5 tablets (4 mg total) by mouth daily.  Dispense: 90 tablet; Refill: 3  9. Gastroesophageal reflux disease, esophagitis presence not specified  - lansoprazole (PREVACID) 30 MG capsule; Take 1 capsule (30 mg total) by mouth daily.  Dispense: 90 capsule; Refill: 1  10. Other insomnia  - mirtazapine (REMERON) 15 MG tablet; Take 1-2 tablets (15-30 mg total) by mouth at bedtime.  Dispense: 60 tablet; Refill: 0  11. Centrilobular emphysema (HCC)  Denies cough at this time Under the care of Dr. Raul Del or prn medication  only

## 2018-07-17 ENCOUNTER — Telehealth: Payer: Self-pay

## 2018-07-17 NOTE — Telephone Encounter (Signed)
I cannot call back today, can you please call wife? I can try calling her tomorrow

## 2018-07-17 NOTE — Telephone Encounter (Signed)
Copied from [place] [id number]. Topic: Inquiry >> Jul 17, 2018  8:47 AM Conception Chancy, NT wrote: Reason for CRM: patient wife is calling and states she would like Dr. Ancil Boozer to contact her. She would not discuss the information with me, she states Dr. Ancil Boozer already knows.  Patient's wife stated that she will talk with her when they come back in on the 13th for a f/u office visit.

## 2018-07-21 ENCOUNTER — Inpatient Hospital Stay: Payer: Medicare Other

## 2018-07-21 NOTE — Progress Notes (Signed)
Nutrition  Noted Eugene Murphy called and left message on cancer center's answering machine to cancel nutrition appointment for today.  Noted scheduling has called and left message for patient to call back and reschedule  Eugene Murphy, [place], [place] Registered Dietitian [phone] (pager)

## 2018-07-23 ENCOUNTER — Telehealth: Payer: Self-pay | Admitting: Family Medicine

## 2018-07-23 DIAGNOSIS — F323 Major depressive disorder, single episode, severe with psychotic features: Secondary | ICD-10-CM

## 2018-07-23 NOTE — Telephone Encounter (Signed)
Copied from [place] [id number]. Topic: General - Other >> Jul 23, 2018  9:10 AM Keene Breath wrote: Reason for CRM: Patient's wife called to inform the doctor that the patient is not gaining any weight and is currently under 200 lbs.  She does not know what to do at this point and would like the doctor to call her to discuss.  Please advise.  CB# [phone]

## 2018-07-24 NOTE — Telephone Encounter (Signed)
Please advise 

## 2018-07-24 NOTE — Telephone Encounter (Signed)
Cancer center called for referral to dietician.  Is Remeron helping?  Would she like for me to refer him to geriatric psychiatrist?

## 2018-07-25 NOTE — Telephone Encounter (Signed)
Patient's wife stated that when he takes the Remeron he eats ok but he does not take it everyday as directed.   Patient has already been signed up to see a nutritionist. He will call them back to get scheduled.  Patient will like to proceed with a local geriatric psychiatrist so that she will be able to take him.  FYI: She stated he has lost more weight  No appt on 07/30/18 due to her own appt.

## 2018-07-27 NOTE — Telephone Encounter (Signed)
The closest geriatric psychiatrist is in [place].  He should take Remeron every night. She may need to dispense it to him

## 2018-07-28 NOTE — Telephone Encounter (Signed)
Can you place that information on the referral part

## 2018-07-28 NOTE — Telephone Encounter (Signed)
Please send referral to   [facility] [place] [address] [phone] (f) [email or web address] (e) Office hours Monday - Friday 8 AM - 5 PM   Tobey Bride, MD, PhD Selinda Flavin, MD, MS

## 2018-07-29 NOTE — Telephone Encounter (Signed)
done

## 2018-07-31 ENCOUNTER — Other Ambulatory Visit: Payer: Self-pay | Admitting: Family Medicine

## 2018-07-31 DIAGNOSIS — G4709 Other insomnia: Secondary | ICD-10-CM

## 2018-07-31 DIAGNOSIS — F323 Major depressive disorder, single episode, severe with psychotic features: Secondary | ICD-10-CM

## 2018-07-31 NOTE — Telephone Encounter (Signed)
90 day refill request:  Refill request for general medication: Celexa 20 mg Remeron 15 mg  Last office visit: 10/02/20119  Last physical exam: 12/17/2016  Follow-ups on file. 08/20/2018

## 2018-08-01 ENCOUNTER — Other Ambulatory Visit: Payer: Self-pay | Admitting: Family Medicine

## 2018-08-01 DIAGNOSIS — F323 Major depressive disorder, single episode, severe with psychotic features: Secondary | ICD-10-CM

## 2018-08-01 DIAGNOSIS — G4709 Other insomnia: Secondary | ICD-10-CM

## 2018-08-04 ENCOUNTER — Telehealth: Payer: Self-pay

## 2018-08-04 DIAGNOSIS — R413 Other amnesia: Secondary | ICD-10-CM

## 2018-08-04 DIAGNOSIS — F323 Major depressive disorder, single episode, severe with psychotic features: Secondary | ICD-10-CM

## 2018-08-04 NOTE — Telephone Encounter (Signed)
Copied from [place] [id number]. Topic: Referral - Request for Referral >> Aug 04, 2018  3:31 PM Judyann Munson wrote: Has patient seen PCP for this complaint? No  Referral for which specialty: Neurologist  Preferred provider/office: Dr. Linton Flemings, MD Reason for referral: Memory loss concerns

## 2018-08-07 ENCOUNTER — Inpatient Hospital Stay: Payer: Medicare Other

## 2018-08-07 ENCOUNTER — Inpatient Hospital Stay: Payer: Medicare Other | Admitting: Hematology and Oncology

## 2018-08-20 ENCOUNTER — Ambulatory Visit: Payer: Medicare Other | Admitting: Family Medicine

## 2018-08-20 ENCOUNTER — Encounter: Payer: Self-pay | Admitting: Family Medicine

## 2018-08-20 VITALS — BP 106/58 | HR 92 | Temp 98.3°F | Resp 16 | Ht 69.0 in | Wt 195.2 lb

## 2018-08-20 DIAGNOSIS — F323 Major depressive disorder, single episode, severe with psychotic features: Secondary | ICD-10-CM

## 2018-08-20 DIAGNOSIS — R339 Retention of urine, unspecified: Secondary | ICD-10-CM

## 2018-08-20 DIAGNOSIS — R251 Tremor, unspecified: Secondary | ICD-10-CM

## 2018-08-20 DIAGNOSIS — I951 Orthostatic hypotension: Secondary | ICD-10-CM

## 2018-08-20 DIAGNOSIS — D3500 Benign neoplasm of unspecified adrenal gland: Secondary | ICD-10-CM

## 2018-08-20 DIAGNOSIS — R413 Other amnesia: Secondary | ICD-10-CM

## 2018-08-20 DIAGNOSIS — R42 Dizziness and giddiness: Secondary | ICD-10-CM

## 2018-08-20 DIAGNOSIS — F22 Delusional disorders: Secondary | ICD-10-CM

## 2018-08-20 DIAGNOSIS — R634 Abnormal weight loss: Secondary | ICD-10-CM | POA: Diagnosis not present

## 2018-08-20 LAB — POCT URINALYSIS DIPSTICK
Bilirubin, UA: NEGATIVE
Blood, UA: NEGATIVE
Glucose, UA: NEGATIVE
KETONES UA: NEGATIVE
Leukocytes, UA: NEGATIVE
Nitrite, UA: NEGATIVE
Protein, UA: NEGATIVE
SPEC GRAV UA: 1.015 (ref 1.010–1.025)
Urobilinogen, UA: 0.2 E.U./dL
pH, UA: 5 (ref 5.0–8.0)

## 2018-08-20 NOTE — Patient Instructions (Addendum)
Eugene K. Manuella Ghazi, MD   Locations   [facility] - Neurology  [address] [place], [place] [address]  Phone number: [phone]  Appointment tomorrow on 08/21/2018   Show up at 2:00 p.m. To do new patient paper work Appointment is at 2:30 p.m.

## 2018-08-20 NOTE — Progress Notes (Signed)
Name: Eugene Murphy   MRN: [id number]    DOB: 01/13/1932   Date:08/20/2018       Progress Note  Subjective  Chief Complaint  Chief Complaint  Patient presents with  . Follow-up    6 week F/U  . Depression    Has been very worried about past events and knows he has accused his wife of putting stuff in his drink  . Hypotension    BP is still low and his daughter cut the Cardura but did not stop the Enalapril. EMS was there Saturday and his BP was checked by Paramedic-140/80's  . Headache    Will have a headache every day in the back of his head    HPI  He is not feeling any better, getting more paranoid. He los 14 lbs in 6 weeks, he was worried that his wife was poisoning his food and drinks. He has been hearing voices and afraid to void and or go to the bathroom because of auditory hallucinations that happens when he is in he bathroom, he is also afraid to leave his house, he states he does not have drivers license and cannot drive ( wife states he has a drivers license) . His bp is dropping and gets dizzy, he told CMA he was having headaches on nuchal area, also has intermittent tremors. We contact Dr. Shah and he will see him tomorrow since there was such a drastic change in his function   Patient Active Problem List   Diagnosis Date Noted  . Weight loss 07/06/2018  . Abnormal computed tomography of large intestine   . Benign neoplasm of ascending colon   . Polyp of sigmoid colon   . Colon wall thickening 06/17/2018  . Lesion of liver 06/17/2018  . OSA on CPAP 12/31/2017  . GERD (gastroesophageal reflux disease) 07/25/2016  . Environmental and seasonal allergies 02/29/2016  . Elevated PSA 11/02/2015  . Hyperlipidemia 04/13/2015  . Hypertension 04/06/2015  . BPH with obstruction/lower urinary tract symptoms 04/06/2015  . Hypersomnia with sleep apnea 12/21/2013  . Asthma, mild intermittent 12/21/2013  . Chronic obstructive airway disease with asthma (HCC) 12/21/2013     Past Surgical History:  Procedure Laterality Date  . COLONOSCOPY WITH PROPOFOL N/A 06/19/2018   Procedure: COLONOSCOPY WITH PROPOFOL WITH BIOPSIES;  Surgeon: Wohl, Darren, MD;  Location: [facility];  Service: Endoscopy;  Laterality: N/A;  sleep apnea  . COSMETIC SURGERY Left    Left Arm Plastic Surgery in 1970s    Family History  Problem Relation Age of Onset  . Congestive Heart Failure Father   . Healthy Sister   . Dementia Brother     Social History   Socioeconomic History  . Marital status: Married    Spouse name: Flora  . Number of children: 3  . Years of education: Not on file  . Highest education level: 9th grade  Occupational History  . Occupation: cab drivers    Comment: retired many years ago   Social Needs  . Financial resource strain: Not hard at all  . Food insecurity:    Worry: Never true    Inability: Never true  . Transportation needs:    Medical: No    Non-medical: Not on file  Tobacco Use  . Smoking status: Never Smoker  . Smokeless tobacco: Never Used  Substance and Sexual Activity  . Alcohol use: Yes    Alcohol/week: 0.0 standard drinks    Comment: Occasional  . Drug use: No  .   Sexual activity: Yes    Partners: Female  Lifestyle  . Physical activity:    Days per week: 0 days    Minutes per session: 0 min  . Stress: Not at all  Relationships  . Social connections:    Talks on phone: Once a week    Gets together: More than three times a week    Attends religious service: More than 4 times per year    Active member of club or organization: No    Attends meetings of clubs or organizations: Never    Relationship status: Married  . Intimate partner violence:    Fear of current or ex partner: No    Emotionally abused: No    Physically abused: No    Forced sexual activity: No  Other Topics Concern  . Not on file  Social History Narrative   He had 3 children prior to getting married, one died in a motorcycle accident    He has  two grown children in [place] and one in [place]   Moved here from [place] after retirement      Current Outpatient Medications:  .  Albuterol Sulfate 108 (90 Base) MCG/ACT AEPB, , Disp: , Rfl:  .  Ascorbic Acid (VITAMIN C) 1000 MG tablet, Take by mouth., Disp: , Rfl:  .  ASMANEX 30 METERED DOSES 220 MCG/INH inhaler, , Disp: , Rfl:  .  azelastine (ASTELIN) 0.1 % nasal spray, Place into the nose., Disp: , Rfl:  .  Cholecalciferol (VITAMIN D-3) 5000 units TABS, Take by mouth daily., Disp: , Rfl:  .  citalopram (CELEXA) 20 MG tablet, TAKE 1 TABLET BY MOUTH EVERY DAY, Disp: 90 tablet, Rfl: 0 .  doxazosin (CARDURA) 8 MG tablet, Take 0.5 tablets (4 mg total) by mouth daily., Disp: 90 tablet, Rfl: 3 .  dutasteride (AVODART) 0.5 MG capsule, Take 1 capsule (0.5 mg total) by mouth daily. (Patient taking differently: Take 0.5 mg by mouth daily. ), Disp: 90 capsule, Rfl: 3 .  enalapril (VASOTEC) 10 MG tablet, Take 1 tablet (10 mg total) by mouth daily. Hold if bp below 135/80, Disp: 90 tablet, Rfl: 1 .  fluticasone (FLONASE) 50 MCG/ACT nasal spray, , Disp: , Rfl:  .  lansoprazole (PREVACID) 30 MG capsule, Take 1 capsule (30 mg total) by mouth daily., Disp: 90 capsule, Rfl: 1 .  latanoprost (XALATAN) 0.005 % ophthalmic solution, Place 1 drop into both eyes at bedtime. , Disp: , Rfl:  .  levocetirizine (XYZAL) 5 MG tablet, Take 1 tablet (5 mg total) by mouth every evening., Disp: 90 tablet, Rfl: 1 .  mirtazapine (REMERON) 15 MG tablet, TAKE 1-2 TABLETS (15-30 MG TOTAL) BY MOUTH AT BEDTIME., Disp: 60 tablet, Rfl: 0 .  ondansetron (ZOFRAN ODT) 4 MG disintegrating tablet, Take 1 tablet (4 mg total) by mouth every 6 (six) hours as needed for nausea or vomiting., Disp: 20 tablet, Rfl: 0 .  Potassium 99 MG TABS, Take 1 tablet by mouth daily at 8 pm., Disp: , Rfl:  .  pravastatin (PRAVACHOL) 40 MG tablet, Take 1 tablet (40 mg total) by mouth daily., Disp: 90 tablet, Rfl: 1  Allergies  Allergen Reactions  .  Shellfish Allergy Swelling    Throat swells    I personally reviewed active problem list, medication list, allergies, family history, social history with the patient/caregiver today.   ROS  Constitutional: Negative for fever, positive for weight change.  Respiratory: Negative for cough and shortness of breath.   Cardiovascular:   Negative for chest pain or palpitations.  Gastrointestinal: Negative for abdominal pain, no bowel changes.  Musculoskeletal: Positive for gait problem but no joint swelling.  Skin: Negative for rash.  Neurological: positive  for dizziness but no headache.  No other specific complaints in a complete review of systems (except as listed in HPI above).  Objective  Vitals:   08/20/18 1128  BP: (!) 106/58  Pulse: 92  Resp: 16  Temp: 98.3 F (36.8 C)  TempSrc: Oral  SpO2: 98%  Weight: 195 lb 3.2 oz (88.5 kg)  Height: 5' 9" (1.753 m)    Body mass index is 28.83 kg/m.  Physical Exam  Constitutional: Patient appears very thin, lost a lot of weight. No distress.  HEENT: head atraumatic, normocephalic, pupils equal and reactive to light, neck supple, throat within normal limits Cardiovascular: Normal rate, regular rhythm and normal heart sounds.  No murmur heard. No BLE edema. Pulmonary/Chest: Effort normal and breath sounds normal. No respiratory distress. Abdominal: Soft.  There is no tenderness. Psychiatric: Patient has a normal mood and affect. Tremors coming on and off, seems anxious Muscular skeletal: using a cane to help with gait .  Recent Results (from the past 2160 hour(s))  Urinalysis, Complete     Status: Abnormal   Collection Time: 06/11/18  2:25 PM  Result Value Ref Range   Specific Gravity, UA 1.015 1.005 - 1.030   pH, UA 5.5 5.0 - 7.5   Color, UA Yellow Yellow   Appearance Ur Clear Clear   Leukocytes, UA Negative Negative   Protein, UA Trace (A) Negative/Trace   Glucose, UA Negative Negative   Ketones, UA 1+ (A) Negative   RBC, UA  Negative Negative   Bilirubin, UA Negative Negative   Urobilinogen, Ur 0.2 0.2 - 1.0 mg/dL   Nitrite, UA Negative Negative   Microscopic Examination See below:   BLADDER SCAN AMB NON-IMAGING     Status: None   Collection Time: 06/11/18  2:25 PM  Result Value Ref Range   Scan Result 4m   Microscopic Examination     Status: Abnormal   Collection Time: 06/11/18  2:25 PM  Result Value Ref Range   WBC, UA None seen 0 - 5 /hpf   RBC, UA None seen 0 - 2 /hpf   Epithelial Cells (non renal) None seen 0 - 10 /hpf   Casts Present (A) None seen /lpf   Cast Type Hyaline casts N/A   Mucus, UA Present (A) Not Estab.   Bacteria, UA Moderate (A) None seen/Few  Comprehensive metabolic panel     Status: Abnormal   Collection Time: 06/11/18  3:00 PM  Result Value Ref Range   Glucose 101 (H) 65 - 99 mg/dL   BUN 12 8 - 27 mg/dL   Creatinine, Ser 1.26 0.76 - 1.27 mg/dL   GFR calc non Af Amer 52 (L) >59 mL/min/1.73   GFR calc Af Amer 60 >59 mL/min/1.73   BUN/Creatinine Ratio 10 10 - 24   Sodium 135 134 - 144 mmol/L   Potassium 4.3 3.5 - 5.2 mmol/L   Chloride 97 96 - 106 mmol/L   CO2 21 20 - 29 mmol/L   Calcium 10.4 (H) 8.6 - 10.2 mg/dL   Total Protein 7.1 6.0 - 8.5 g/dL   Albumin 4.7 3.5 - 4.7 g/dL   Globulin, Total 2.4 1.5 - 4.5 g/dL   Albumin/Globulin Ratio 2.0 1.2 - 2.2   Bilirubin Total 0.6 0.0 - 1.2 mg/dL   Alkaline Phosphatase  42 39 - 117 IU/L   AST 28 0 - 40 IU/L   ALT 36 0 - 44 IU/L  CBC With differential/Platelet     Status: Abnormal   Collection Time: 06/11/18  3:00 PM  Result Value Ref Range   WBC 6.4 3.4 - 10.8 x10E3/uL   RBC 3.98 (L) 4.14 - 5.80 x10E6/uL   Hemoglobin 12.5 (L) 13.0 - 17.7 g/dL   Hematocrit 36.2 (L) 37.5 - 51.0 %   MCV 91 79 - 97 fL   MCH 31.4 26.6 - 33.0 pg   MCHC 34.5 31.5 - 35.7 g/dL   RDW 12.3 12.3 - 15.4 %   Neutrophils 73 Not Estab. %   Lymphs 16 Not Estab. %   Monocytes 11 Not Estab. %   Eos 0 Not Estab. %   Basos 0 Not Estab. %   Neutrophils  Absolute 4.7 1.4 - 7.0 x10E3/uL   Lymphocytes Absolute 1.0 0.7 - 3.1 x10E3/uL   Monocytes Absolute 0.7 0.1 - 0.9 x10E3/uL   EOS (ABSOLUTE) 0.0 0.0 - 0.4 x10E3/uL   Basophils Absolute 0.0 0.0 - 0.2 x10E3/uL   Immature Granulocytes 0 Not Estab. %   Immature Grans (Abs) 0.0 0.0 - 0.1 x10E3/uL  Lipase, blood     Status: None   Collection Time: 06/15/18  9:11 AM  Result Value Ref Range   Lipase 24 11 - 51 U/L    Comment: Performed at [facility], [address] [place], [place] [address]  Comprehensive metabolic panel     Status: Abnormal   Collection Time: 06/15/18  9:11 AM  Result Value Ref Range   Sodium 134 (L) 135 - 145 mmol/L   Potassium 3.9 3.5 - 5.1 mmol/L   Chloride 99 98 - 111 mmol/L   CO2 25 22 - 32 mmol/L   Glucose, Bld 109 (H) 70 - 99 mg/dL   BUN 13 8 - 23 mg/dL   Creatinine, Ser 1.10 0.61 - 1.24 mg/dL   Calcium 9.8 8.9 - 10.3 mg/dL   Total Protein 7.1 6.5 - 8.1 g/dL   Albumin 4.4 3.5 - 5.0 g/dL   AST 30 15 - 41 U/L   ALT 35 0 - 44 U/L   Alkaline Phosphatase 34 (L) 38 - 126 U/L   Total Bilirubin 0.7 0.3 - 1.2 mg/dL   GFR calc non Af Amer 60 (L) >60 mL/min   GFR calc Af Amer >60 >60 mL/min    Comment: (NOTE) The eGFR has been calculated using the CKD EPI equation. This calculation has not been validated in all clinical situations. eGFR's persistently <60 mL/min signify possible Chronic Kidney Disease.    Anion gap 10 5 - 15    Comment: Performed at [facility], [address] [place], [place] [address]  CBC     Status: Abnormal   Collection Time: 06/15/18  9:11 AM  Result Value Ref Range   WBC 4.2 3.8 - 10.6 K/uL   RBC 4.11 (L) 4.40 - 5.90 MIL/uL   Hemoglobin 13.0 13.0 - 18.0 g/dL   HCT 37.9 (L) 40.0 - 52.0 %   MCV 92.1 80.0 - 100.0 fL   MCH 31.5 26.0 - 34.0 pg   MCHC 34.2 32.0 - 36.0 g/dL   RDW 13.4 11.5 - 14.5 %   Platelets 241 150 - 440 K/uL    Comment: Performed at [facility], [address], [place], [place] [address]   TSH     Status: None  Collection Time: 06/15/18  9:11 AM  Result Value Ref Range   TSH 1.946 0.350 - 4.500 uIU/mL    Comment: Performed by a 3rd Generation assay with a functional sensitivity of <=0.01 uIU/mL. Performed at [facility], [address] [place], [place] [address]   Urinalysis, Complete w Microscopic     Status: Abnormal   Collection Time: 06/15/18  9:12 AM  Result Value Ref Range   Color, Urine YELLOW (A) YELLOW   APPearance CLEAR (A) CLEAR   Specific Gravity, Urine 1.008 1.005 - 1.030   pH 6.0 5.0 - 8.0   Glucose, UA NEGATIVE NEGATIVE mg/dL   Hgb urine dipstick NEGATIVE NEGATIVE   Bilirubin Urine NEGATIVE NEGATIVE   Ketones, ur NEGATIVE NEGATIVE mg/dL   Protein, ur NEGATIVE NEGATIVE mg/dL   Nitrite NEGATIVE NEGATIVE   Leukocytes, UA NEGATIVE NEGATIVE   RBC / HPF 0-5 0 - 5 RBC/hpf   WBC, UA 0-5 0 - 5 WBC/hpf   Bacteria, UA NONE SEEN NONE SEEN   Squamous Epithelial / LPF NONE SEEN 0 - 5    Comment: Performed at [facility], [address] [place], [place] [address]  APTT     Status: None   Collection Time: 06/17/18 10:19 AM  Result Value Ref Range   aPTT 32 24 - 36 seconds    Comment: Performed at [facility], [address] [place], [place] [address]  Protime-INR     Status: None   Collection Time: 06/17/18 10:19 AM  Result Value Ref Range   Prothrombin Time 13.8 11.4 - 15.2 seconds   INR 1.07     Comment: Performed at [facility], [address] [place], [place] [address]  PSA     Status: None   Collection Time: 06/17/18 10:19 AM  Result Value Ref Range   Prostatic Specific Antigen 0.43 0.00 - 4.00 ng/mL    Comment: (NOTE) While PSA levels of <=4.0 ng/ml are reported as reference range, some men with levels below 4.0 ng/ml can have prostate cancer and many men with PSA above 4.0 ng/ml do not have prostate cancer.  Other tests such as free PSA, age specific reference ranges, PSA velocity and PSA doubling  time may be helpful especially in men less than 60 years old. Performed at  [facility], [address] [place], [place] [address]   CEA     Status: None   Collection Time: 06/17/18 10:19 AM  Result Value Ref Range   CEA 3.3 0.0 - 4.7 ng/mL    Comment: (NOTE)                             Nonsmokers          <3.9                             Smokers             <5.6 [employer] Electrochemiluminescence Immunoassay (ECLIA) Values obtained with different assay methods or kits cannot be used interchangeably.  Results cannot be interpreted as absolute evidence of the presence or absence of malignant disease. Performed At: [facility] [address] , [place] [id number] Nagendra Sanjai MD Ph:[phone]      PHQ2/9: Depression screen PHQ 2/9 07/09/2018 06/17/2018 12/31/2017 10/02/2017 06/19/2017  Decreased Interest 3 0 0 0 0  Down, Depressed, Hopeless 3 0 0   0 0  PHQ - 2 Score 6 0 0 0 0  Altered sleeping 3 0 - - -  Tired, decreased energy 3 0 - - -  Change in appetite 3 3 - - -  Feeling bad or failure about yourself  3 0 - - -  Trouble concentrating 3 0 - - -  Moving slowly or fidgety/restless 3 0 - - -  Suicidal thoughts 0 0 - - -  PHQ-9 Score 24 3 - - -  Difficult doing work/chores Extremely dIfficult Not difficult at all - - -     Fall Risk: Fall Risk  07/09/2018 06/17/2018 12/31/2017 10/02/2017 06/19/2017  Falls in the past year? _0      Assessment & Plan  1. Memory changes  Referral to neurologist already made   2. Hypotension, postural  Hold zestril, bp has been dropping   3. Paranoia ([place])  Afraid to void , he hear voices when he is in the bathroom, also worried that his wife is poisoning his food and drink   4. Unexplained weight loss  Lost 14 lbs in the past 6 weeks but is eating again  5. Severe major depression with psychotic features ([place])  Taking medication  6. Tremors of nervous system   7. Dizziness  Needs  to hold medication    8. Adrenal adenoma, unspecified laterality  CT ordered by hematologist

## 2018-08-25 ENCOUNTER — Other Ambulatory Visit: Payer: Self-pay | Admitting: Neurology

## 2018-08-25 DIAGNOSIS — R413 Other amnesia: Secondary | ICD-10-CM

## 2018-08-27 ENCOUNTER — Other Ambulatory Visit: Payer: Self-pay | Admitting: Family Medicine

## 2018-08-27 DIAGNOSIS — G4709 Other insomnia: Secondary | ICD-10-CM

## 2018-09-10 ENCOUNTER — Ambulatory Visit
Admission: RE | Admit: 2018-09-10 | Discharge: 2018-09-10 | Disposition: A | Discharge status: Home / Self Care | Payer: Medicare Other | Source: Ambulatory Visit | Attending: Cardiology | Admitting: Cardiology

## 2018-09-10 ENCOUNTER — Other Ambulatory Visit: Payer: Self-pay | Admitting: Cardiology

## 2018-09-10 DIAGNOSIS — G4730 Sleep apnea, unspecified: Secondary | ICD-10-CM | POA: Diagnosis not present

## 2018-09-10 DIAGNOSIS — G4710 Hypersomnia, unspecified: Secondary | ICD-10-CM | POA: Insufficient documentation

## 2018-09-10 DIAGNOSIS — M79604 Pain in right leg: Secondary | ICD-10-CM

## 2018-09-10 DIAGNOSIS — R600 Localized edema: Secondary | ICD-10-CM | POA: Insufficient documentation

## 2018-09-12 ENCOUNTER — Ambulatory Visit
Admission: RE | Admit: 2018-09-12 | Discharge: 2018-09-12 | Disposition: A | Discharge status: Home / Self Care | Payer: Medicare Other | Source: Ambulatory Visit | Attending: Neurology | Admitting: Neurology

## 2018-09-12 DIAGNOSIS — F0391 Unspecified dementia with behavioral disturbance: Secondary | ICD-10-CM | POA: Insufficient documentation

## 2018-09-12 DIAGNOSIS — F03918 Unspecified dementia, unspecified severity, with other behavioral disturbance: Secondary | ICD-10-CM | POA: Insufficient documentation

## 2018-09-12 DIAGNOSIS — R413 Other amnesia: Secondary | ICD-10-CM

## 2018-09-12 DIAGNOSIS — F01518 Vascular dementia, unspecified severity, with other behavioral disturbance: Secondary | ICD-10-CM | POA: Insufficient documentation

## 2018-09-17 ENCOUNTER — Encounter: Payer: Self-pay | Admitting: Family Medicine

## 2018-09-17 ENCOUNTER — Ambulatory Visit: Payer: Medicare Other | Admitting: Family Medicine

## 2018-09-17 VITALS — BP 118/62 | HR 99 | Temp 98.4°F | Resp 16 | Ht 69.0 in | Wt 196.9 lb

## 2018-09-17 DIAGNOSIS — N400 Enlarged prostate without lower urinary tract symptoms: Secondary | ICD-10-CM

## 2018-09-17 DIAGNOSIS — I952 Hypotension due to drugs: Secondary | ICD-10-CM | POA: Diagnosis not present

## 2018-09-17 DIAGNOSIS — F01518 Vascular dementia, unspecified severity, with other behavioral disturbance: Secondary | ICD-10-CM

## 2018-09-17 DIAGNOSIS — F0151 Vascular dementia with behavioral disturbance: Secondary | ICD-10-CM | POA: Diagnosis not present

## 2018-09-17 DIAGNOSIS — R351 Nocturia: Secondary | ICD-10-CM

## 2018-09-17 NOTE — Progress Notes (Signed)
Name: Eugene Murphy   MRN: [id number]    DOB: 1932-05-31   Date:09/18/2018       Progress Note  Subjective  Chief Complaint  Chief Complaint  Patient presents with  . Follow-up    1 month  . Hyperlipidemia  . Hypertension  . Depression    HPI  Dementia  with behavior changes : his health went down around Summer 2019  he started going to multiple doctors. Had abrupt onset of weight loss , his weight was in the 250's 2018   He had nausea, weight loss, fatigue. Wife noticed a change in behavior feeling down, unable to sleep,  pacing all the time, and auditory  hallucinations  We started him on Citalopram and remeron for appetite and mood, and referred him to Dr. Manuella Ghazi for evaluation of possible dementia. His weight has been steady over the past 6 weeks. His appetite is better, he was started on seroquel by Dr. Manuella Ghazi and since started on medication symptoms has improved during the day and night.   BPH: we decreased dose of Cardura to half pill because of hypotension but wife states over the past two weeks he has been having worsening of nocturia during the night only, so she decided to go back to 8 mg daily.   HTN: now bp is low, he is off Vasotec, no chest pain or palpitation   Patient Active Problem List   Diagnosis Date Noted  . Dementia with behavioral disturbance ([place]) 09/12/2018  . Weight loss 07/06/2018  . Abnormal computed tomography of large intestine   . Benign neoplasm of ascending colon   . Polyp of sigmoid colon   . Colon wall thickening 06/17/2018  . Lesion of liver 06/17/2018  . OSA on CPAP 12/31/2017  . GERD (gastroesophageal reflux disease) 07/25/2016  . Environmental and seasonal allergies 02/29/2016  . Elevated PSA 11/02/2015  . Hyperlipidemia 04/13/2015  . Hypertension 04/06/2015  . BPH with obstruction/lower urinary tract symptoms 04/06/2015  . Hypersomnia with sleep apnea 12/21/2013  . Asthma, mild intermittent 12/21/2013  . Chronic obstructive airway  disease with asthma ([place]) 12/21/2013    Past Surgical History:  Procedure Laterality Date  . COLONOSCOPY WITH PROPOFOL N/A 06/19/2018   Procedure: COLONOSCOPY WITH PROPOFOL WITH BIOPSIES;  Surgeon: [name], MD;  Location: [facility];  Service: Endoscopy;  Laterality: N/A;  sleep apnea  . COSMETIC SURGERY Left    Left Arm Plastic Surgery in 1970s    Family History  Problem Relation Age of Onset  . Congestive Heart Failure Father   . Healthy Sister   . Dementia Brother     Social History   Socioeconomic History  . Marital status: Married    Spouse name: Flora  . Number of children: 3  . Years of education: Not on file  . Highest education level: 9th grade  Occupational History  . Occupation: cab drivers    Comment: retired many years ago   Social Needs  . Financial resource strain: Not hard at all  . Food insecurity:    Worry: Never true    Inability: Never true  . Transportation needs:    Medical: No    Non-medical: Not on file  Tobacco Use  . Smoking status: Never Smoker  . Smokeless tobacco: Never Used  Substance and Sexual Activity  . Alcohol use: Yes    Alcohol/week: 0.0 standard drinks    Comment: Occasional  . Drug use: No  . Sexual activity: Yes  Partners: Female  Lifestyle  . Physical activity:    Days per week: 0 days    Minutes per session: 0 min  . Stress: Not at all  Relationships  . Social connections:    Talks on phone: Once a week    Gets together: More than three times a week    Attends religious service: More than 4 times per year    Active member of club or organization: No    Attends meetings of clubs or organizations: Never    Relationship status: Married  . Intimate partner violence:    Fear of current or ex partner: No    Emotionally abused: No    Physically abused: No    Forced sexual activity: No  Other Topics Concern  . Not on file  Social History Narrative   He had 3 children prior to getting married, one died  in a motorcycle accident    He has two grown children in [place] and one in [place] here from [place] after retirement      Current Outpatient Medications:  .  Albuterol Sulfate 108 (90 Base) MCG/ACT AEPB, Inhale 2 puffs into the lungs as needed. , Disp: , Rfl:  .  Ascorbic Acid (VITAMIN C) 1000 MG tablet, Take 500 mg by mouth daily. , Disp: , Rfl:  .  ASMANEX 30 METERED DOSES 220 MCG/INH inhaler, Inhale 2 puffs into the lungs as needed. , Disp: , Rfl:  .  azelastine (ASTELIN) 0.1 % nasal spray, Place 1 spray into both nostrils as needed. , Disp: , Rfl:  .  Cholecalciferol (VITAMIN D-3) 5000 units TABS, Take 1 tablet by mouth daily. , Disp: , Rfl:  .  citalopram (CELEXA) 20 MG tablet, TAKE 1 TABLET BY MOUTH EVERY DAY, Disp: 90 tablet, Rfl: 0 .  doxazosin (CARDURA) 8 MG tablet, Take 0.5 tablets (4 mg total) by mouth daily. (Patient taking differently: Take 8 mg by mouth daily. ), Disp: 90 tablet, Rfl: 3 .  dutasteride (AVODART) 0.5 MG capsule, Take 1 capsule (0.5 mg total) by mouth daily. (Patient taking differently: Take 0.5 mg by mouth daily. ), Disp: 90 capsule, Rfl: 3 .  fluticasone (FLONASE) 50 MCG/ACT nasal spray, , Disp: , Rfl:  .  lansoprazole (PREVACID) 30 MG capsule, Take 1 capsule (30 mg total) by mouth daily., Disp: 90 capsule, Rfl: 1 .  latanoprost (XALATAN) 0.005 % ophthalmic solution, Place 1 drop into both eyes at bedtime. , Disp: , Rfl:  .  levocetirizine (XYZAL) 5 MG tablet, Take 1 tablet (5 mg total) by mouth every evening., Disp: 90 tablet, Rfl: 1 .  mirtazapine (REMERON) 15 MG tablet, TAKE 1-2 TABLETS (15-30 MG TOTAL) BY MOUTH AT BEDTIME., Disp: 60 tablet, Rfl: 0 .  Potassium 99 MG TABS, Take 1 tablet by mouth daily at 8 pm., Disp: , Rfl:  .  pravastatin (PRAVACHOL) 40 MG tablet, Take 1 tablet (40 mg total) by mouth daily., Disp: 90 tablet, Rfl: 1 .  QUEtiapine (SEROQUEL) 25 MG tablet, Take 25 mg by mouth at bedtime., Disp: , Rfl:   Allergies  Allergen Reactions   . Shellfish Allergy Swelling    Throat swells    I personally reviewed active problem list, medication list, allergies, family history with the patient/caregiver today.   ROS  Constitutional: Negative for fever or weight change - over the past month, but significant loss prior to that .  Respiratory: Negative for cough and shortness of breath.  Cardiovascular: Negative for chest pain or palpitations.  Gastrointestinal: Negative for abdominal pain, no bowel changes.  Musculoskeletal: Positive for gait problem or joint swelling.  Skin: Negative for rash.  Neurological: Negative for dizziness or headache.  No other specific complaints in a complete review of systems (except as listed in HPI above).  Objective  Vitals:   09/17/18 1240  BP: 118/62  Pulse: 99  Resp: 16  Temp: 98.4 F (36.9 C)  TempSrc: Oral  Weight: 196 lb 14.4 oz (89.3 kg)  Height: 5\' 9"  (1.753 m)    Body mass index is 29.08 kg/m.  Physical Exam  Constitutional: Patient appears well-developed and well-nourished.  No distress.  HEENT: head atraumatic, normocephalic, pupils equal and reactive to light, neck supple, throat within normal limits Cardiovascular: Normal rate, regular rhythm and normal heart sounds.  No murmur heard. No BLE edema. Pulmonary/Chest: Effort normal and breath sounds normal. No respiratory distress. Abdominal: Soft.  There is no tenderness. Psychiatric: Patient has a normal mood and affect. behavior is normal. Judgment and thought content normal.  Recent Results (from the past 2160 hour(s))  POCT urinalysis dipstick     Status: Normal   Collection Time: 08/20/18 11:57 AM  Result Value Ref Range   Color, UA yellow    Clarity, UA clear    Glucose, UA Negative Negative   Bilirubin, UA neg    Ketones, UA neg    Spec Grav, UA 1.015 1.010 - 1.025   Blood, UA neg    pH, UA 5.0 5.0 - 8.0   Protein, UA Negative Negative   Urobilinogen, UA 0.2 0.2 or 1.0 E.U./dL   Nitrite, UA neg     Leukocytes, UA Negative Negative   Appearance     Odor       PHQ2/9: Depression screen [facility] 2/9 09/17/2018 07/09/2018 06/17/2018 12/31/2017 10/02/2017  Decreased Interest 0 3 0 0 0  Down, Depressed, Hopeless 2 3 0 0 0  PHQ - 2 Score 2 6 0 0 0  Altered sleeping 0 3 0 - -  Tired, decreased energy 1 3 0 - -  Change in appetite 0 3 3 - -  Feeling bad or failure about yourself  0 3 0 - -  Trouble concentrating 0 3 0 - -  Moving slowly or fidgety/restless 0 3 0 - -  Suicidal thoughts 0 0 0 - -  PHQ-9 Score 3 24 3  - -  Difficult doing work/chores Not difficult at all Extremely dIfficult Not difficult at all - -     Fall Risk: Fall Risk  09/17/2018 07/09/2018 06/17/2018 12/31/2017 10/02/2017  Falls in the past year? 0 No No No No    Functional Status Survey: Is the patient deaf or have difficulty hearing?: No Does the patient have difficulty seeing, even when wearing glasses/contacts?: No Does the patient have difficulty concentrating, remembering, or making decisions?: Yes Does the patient have difficulty walking or climbing stairs?: Yes Does the patient have difficulty dressing or bathing?: No Does the patient have difficulty doing errands alone such as visiting a doctor's office or shopping?: Yes    Assessment & Plan  1. Vascular dementia with behavior disturbance ([place])  Seen by Dr. Manuella Ghazi, MRI brain showed microvascular disease no masses. Seroquel helps him fall asleep  2. Nocturia  - Urine Culture  3. BPH without obstruction/lower urinary tract symptoms  Under the care of Urologist, and may need to go back for worsening of urinary frequency if negative urine culture  4.  Hypotension due to drugs  Wife stopped vasotec but is back on Cardura 8 mg because of nocturia, explained bp is low but we will monitor for now

## 2018-09-18 ENCOUNTER — Encounter: Payer: Self-pay | Admitting: Family Medicine

## 2018-09-18 LAB — URINE CULTURE
MICRO NUMBER:: 91485547
Result:: NO GROWTH
SPECIMEN QUALITY:: ADEQUATE

## 2018-09-22 ENCOUNTER — Other Ambulatory Visit: Payer: Self-pay | Admitting: Family Medicine

## 2018-09-22 DIAGNOSIS — G4709 Other insomnia: Secondary | ICD-10-CM

## 2018-10-26 ENCOUNTER — Other Ambulatory Visit: Payer: Self-pay | Admitting: Family Medicine

## 2018-10-26 DIAGNOSIS — G4709 Other insomnia: Secondary | ICD-10-CM

## 2018-10-31 ENCOUNTER — Other Ambulatory Visit: Payer: Self-pay | Admitting: Family Medicine

## 2018-10-31 DIAGNOSIS — F323 Major depressive disorder, single episode, severe with psychotic features: Secondary | ICD-10-CM

## 2018-10-31 NOTE — Telephone Encounter (Signed)
Refill request for general medication. Celexa to [facility]   Last office visit: 09/17/2018   Follow up 11/17/2018

## 2018-11-05 DIAGNOSIS — H401131 Primary open-angle glaucoma, bilateral, mild stage: Secondary | ICD-10-CM | POA: Diagnosis not present

## 2018-11-07 IMAGING — MR MR LUMBAR SPINE W/O CM
5 of 6 series · 29 of 48 positions shown · non-contrast
Comparison: None.

CLINICAL DATA: 86-year-old male with chronic right side low back
pain. No recent injury.

EXAM:
MRI LUMBAR SPINE WITHOUT CONTRAST
TECHNIQUE: Multiplanar, multisequence MR imaging of the lumbar spine was
performed. No intravenous contrast was administered.

[Series 2: T2 · sagittal · 4.0mm · 0.81mm/px · 6 of 19 slices shown (1 of 3)]
[im 1/19]
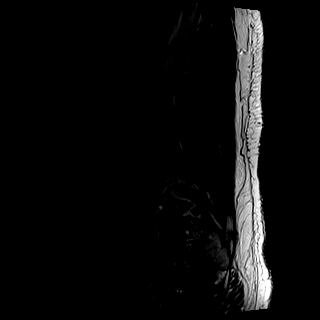
[im 4/19]
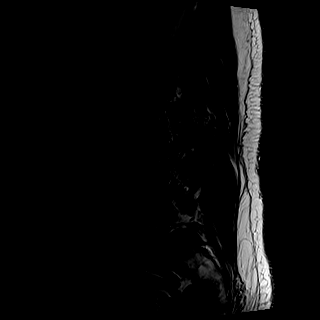
[im 8/19]
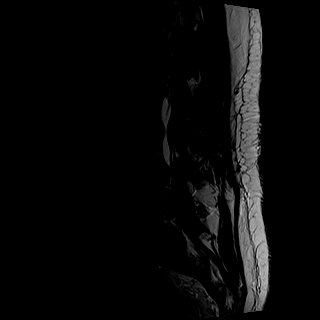
[im 11/19]
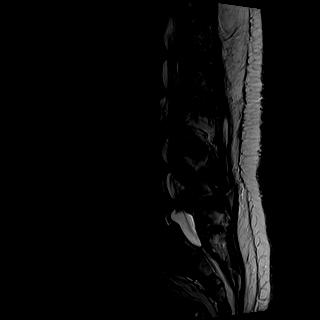
[im 15/19]
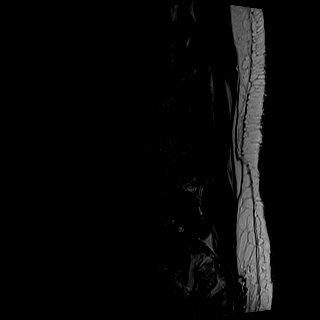
[im 19/19]
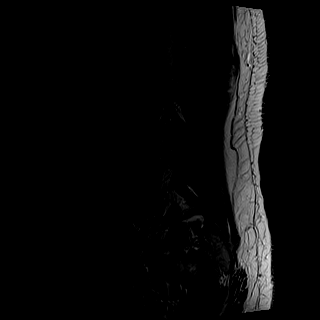

[Series 3: T1 · sagittal · 4.0mm · 0.41mm/px · 6 of 19 slices shown (1 of 2)]
[im 1/19]
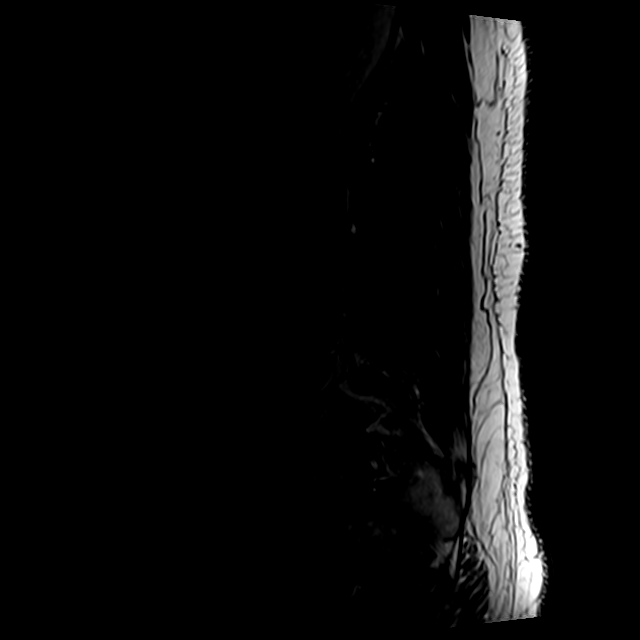
[im 4/19]
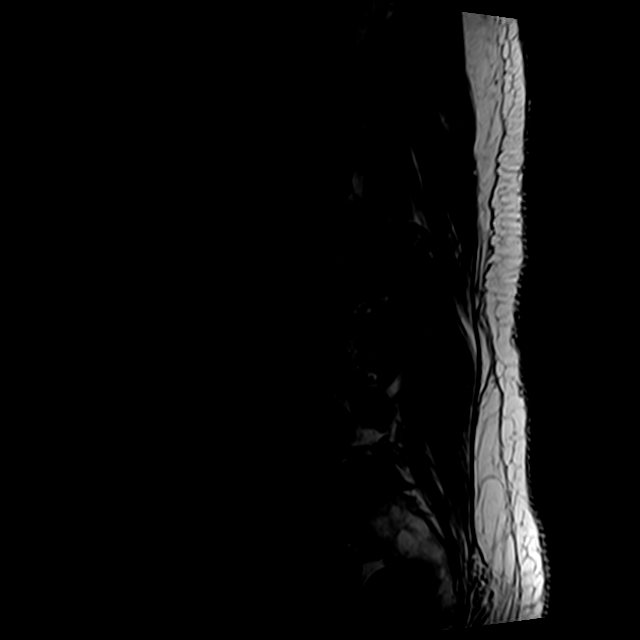
[im 8/19]
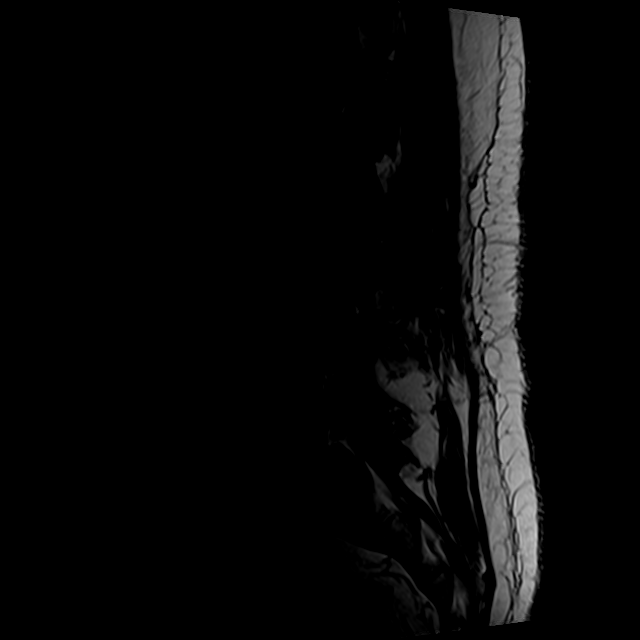
[im 11/19]
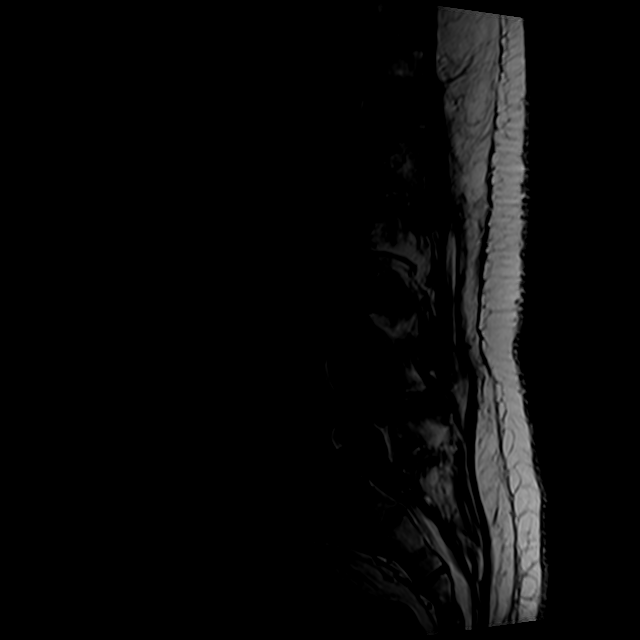
[im 15/19]
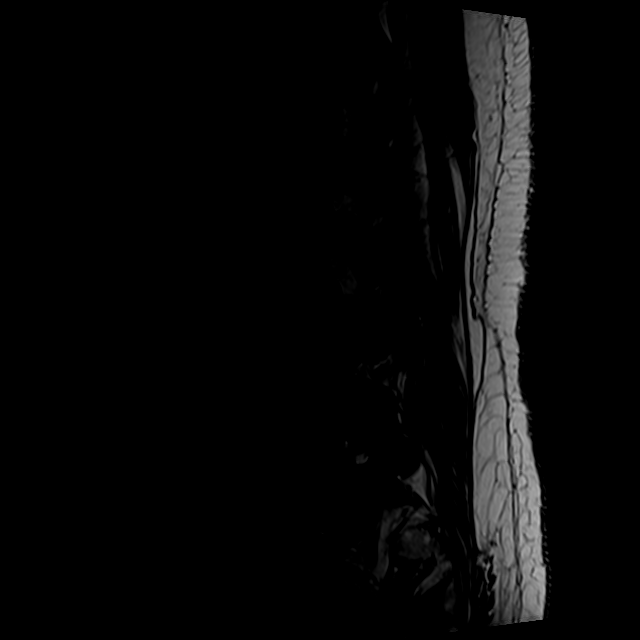
[im 19/19]
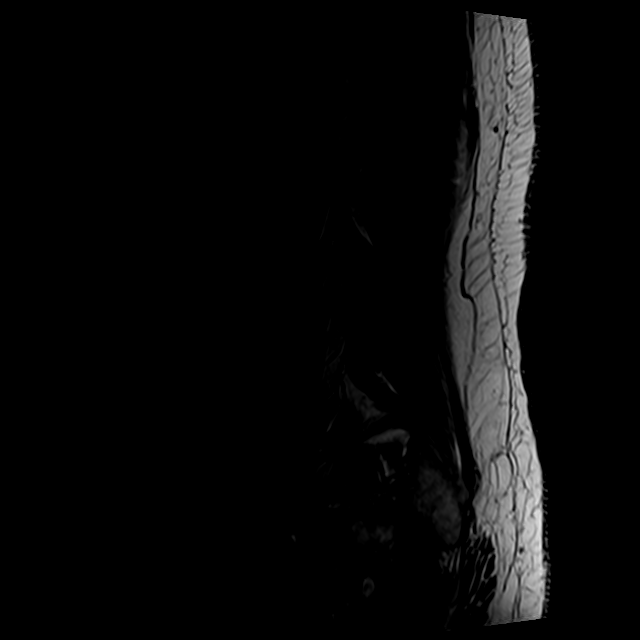

[Series 5: T2 · axial · 4.0mm · 0.78mm/px · z∈[-147,+57]mm · 9 of 39 slices shown (2 of 3)]
[im 1/39]
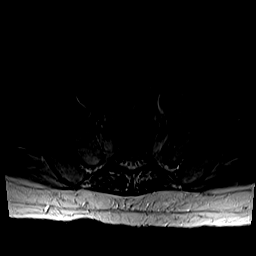
[im 7/39]
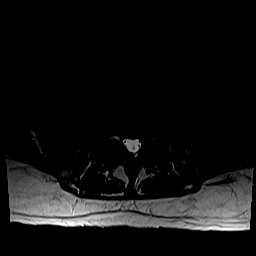
[im 11/39]
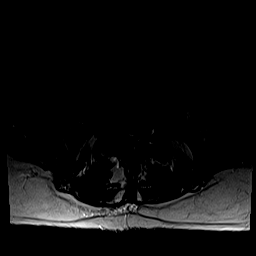
[im 18/39]
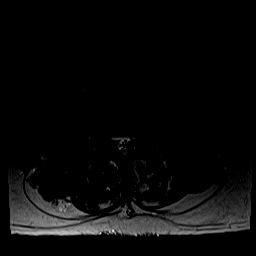
[im 21/39]
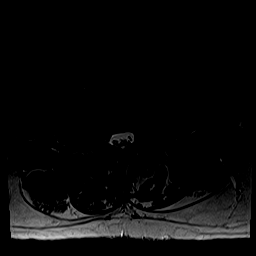
[im 28/39]
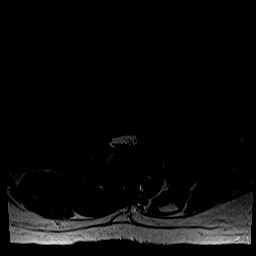
[im 32/39]
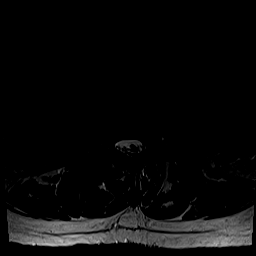
[im 35/39]
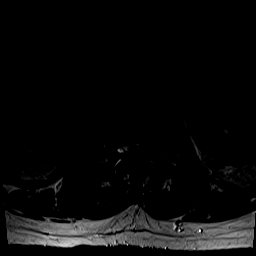
[im 39/39]
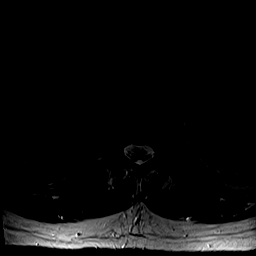

[Series 6: T1 · axial · 4.0mm · 0.39mm/px · z∈[-147,-118]mm · 2 of 39 slices shown (2 of 2)]
[im 1/39]
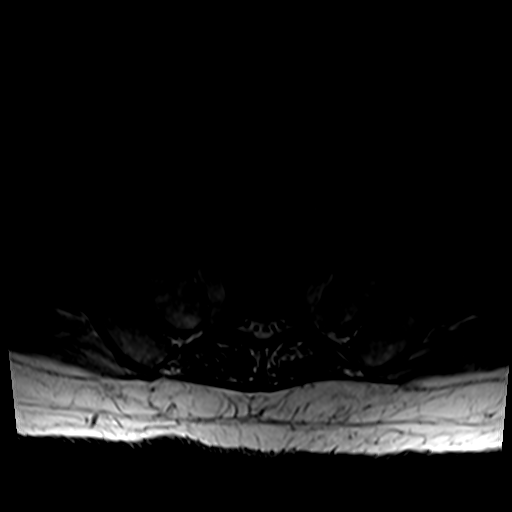
[im 7/39]
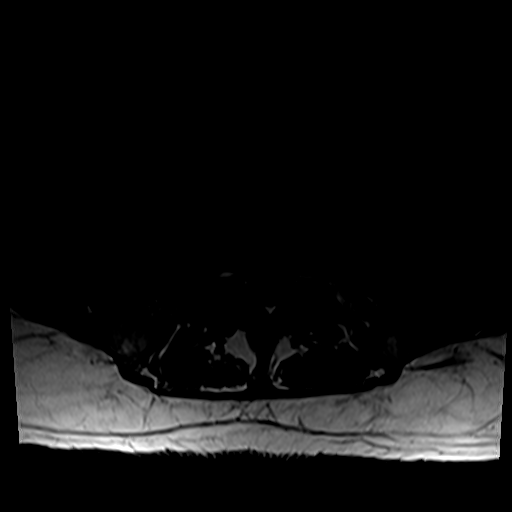

[Series 7: T2 · coronal · 4.0mm · 0.94mm/px · 6 of 19 slices shown (3 of 3)]
[im 1/19]
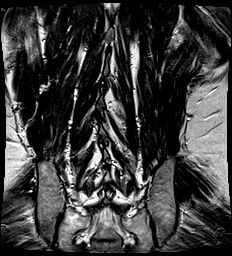
[im 4/19]
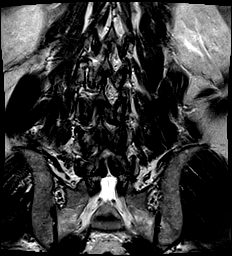
[im 8/19]
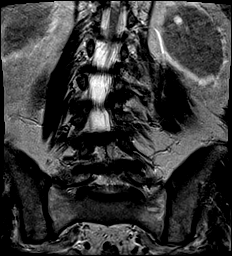
[im 11/19]
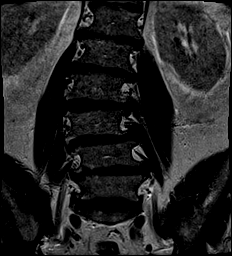
[im 15/19]
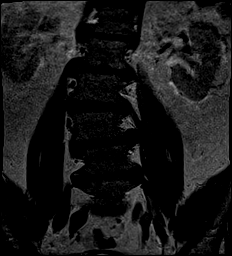
[im 19/19]
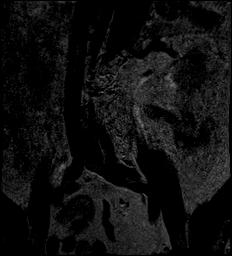

[29 of 48 positions shown; findings below may reference images not displayed]

FINDINGS: Segmentation: Lumbar segmentation will be designated as normal for
this report. There appear to be hypoplastic or absent ribs at T12.

Alignment: Mild to moderate dextroconvex lumbar scoliosis (series 7,
image 12). Superimposed straightening of lumbar lordosis. Grade 1
anterolisthesis of L5 on S1 measuring 5 millimeters. Subtle
retrolisthesis of L4 on L5.

Vertebrae: Minimal degenerative appearing endplate marrow edema at
T12-L1. No other acute osseous abnormality identified. Background
bone marrow signal is normal. Intact visible sacrum and SI joints.

Conus medullaris and cauda equina: Conus extends to the T12-L1
level, see below.

Paraspinal and other soft tissues: Negative.

Disc levels:

T11-T12: Negative.

T12-L1: Moderate to severe disc space loss with vacuum disc.
Circumferential disc bulge with moderate facet and ligament flavum
hypertrophy. Degenerative facet joint fluid.

Mild to moderate spinal and left lateral recess stenosis at the
level of the conus. No definite conus mass effect. There is mild to
moderate right T12 foraminal stenosis.

L1-L2: Disc space loss with circumferential disc bulge. Moderate
facet and ligament flavum hypertrophy. Right side facet joint fluid.

Moderate spinal and moderate to severe left lateral recess stenosis.
Mild to moderate left L1 foraminal stenosis.

L2-L3: Circumferential disc bulge with moderate to severe facet and
ligament flavum hypertrophy greater on the left.

Mild to moderate spinal and left lateral recess stenosis. Moderate
to severe left L2 neural foraminal stenosis.

L3-L4: Disc space loss with vacuum disc. Circumferential disc bulge.
Moderate to severe facet and ligament flavum hypertrophy.

Moderate spinal stenosis with mild to moderate left and severe right
lateral recess stenosis. Moderate to severe left and moderate right
L3 foraminal stenosis.

L4-L5: Severe disc space loss with vacuum disc. Bulky
circumferential disc bulge with endplate spurring. Moderate to
severe facet and ligament flavum hypertrophy.

Severe spinal and bilateral lateral recess stenosis. Moderate left
and moderate to severe right L4 neural foraminal stenosis.

L5-S1: Grade 1 anterolisthesis. Disc space loss with vacuum disc.
Circumferential disc bulge/pseudo disc. Moderate facet and ligament
flavum hypertrophy.

No spinal stenosis. Mild left lateral recess stenosis. Moderate to
severe bilateral L5 foraminal stenosis.
IMPRESSION: 1. Widespread degenerative lower thoracic and lumbar spinal stenosis
from the level of the conus at T12-L1 through L4-L5, severe at the
latter.
2. Dextroconvex lumbar scoliosis with straightening of lordosis and
grade 1 spondylolisthesis at L5-S1.

## 2018-11-17 ENCOUNTER — Encounter: Payer: Self-pay | Admitting: Family Medicine

## 2018-11-17 ENCOUNTER — Ambulatory Visit: Payer: Medicare Other | Admitting: Family Medicine

## 2018-11-17 VITALS — BP 104/70 | HR 105 | Temp 98.3°F | Resp 16 | Ht 67.0 in | Wt 194.3 lb

## 2018-11-17 DIAGNOSIS — N400 Enlarged prostate without lower urinary tract symptoms: Secondary | ICD-10-CM | POA: Diagnosis not present

## 2018-11-17 DIAGNOSIS — F0151 Vascular dementia with behavioral disturbance: Secondary | ICD-10-CM | POA: Diagnosis not present

## 2018-11-17 DIAGNOSIS — G4709 Other insomnia: Secondary | ICD-10-CM

## 2018-11-17 DIAGNOSIS — E441 Mild protein-calorie malnutrition: Secondary | ICD-10-CM | POA: Diagnosis not present

## 2018-11-17 DIAGNOSIS — J432 Centrilobular emphysema: Secondary | ICD-10-CM | POA: Diagnosis not present

## 2018-11-17 DIAGNOSIS — D3500 Benign neoplasm of unspecified adrenal gland: Secondary | ICD-10-CM | POA: Diagnosis not present

## 2018-11-17 DIAGNOSIS — F323 Major depressive disorder, single episode, severe with psychotic features: Secondary | ICD-10-CM | POA: Diagnosis not present

## 2018-11-17 DIAGNOSIS — C787 Secondary malignant neoplasm of liver and intrahepatic bile duct: Secondary | ICD-10-CM | POA: Insufficient documentation

## 2018-11-17 DIAGNOSIS — K219 Gastro-esophageal reflux disease without esophagitis: Secondary | ICD-10-CM

## 2018-11-17 DIAGNOSIS — G4733 Obstructive sleep apnea (adult) (pediatric): Secondary | ICD-10-CM | POA: Diagnosis not present

## 2018-11-17 DIAGNOSIS — R69 Illness, unspecified: Secondary | ICD-10-CM | POA: Diagnosis not present

## 2018-11-17 DIAGNOSIS — F22 Delusional disorders: Secondary | ICD-10-CM | POA: Diagnosis not present

## 2018-11-17 DIAGNOSIS — F01518 Vascular dementia, unspecified severity, with other behavioral disturbance: Secondary | ICD-10-CM

## 2018-11-17 DIAGNOSIS — E785 Hyperlipidemia, unspecified: Secondary | ICD-10-CM

## 2018-11-17 DIAGNOSIS — I10 Essential (primary) hypertension: Secondary | ICD-10-CM

## 2018-11-17 DIAGNOSIS — Z9989 Dependence on other enabling machines and devices: Secondary | ICD-10-CM

## 2018-11-17 MED ORDER — CITALOPRAM HYDROBROMIDE 20 MG PO TABS: 20.0000 mg | ORAL_TABLET | Freq: Every day | ORAL | 1 refills | Status: DC

## 2018-11-17 MED ORDER — PRAVASTATIN SODIUM 40 MG PO TABS: 40.0000 mg | ORAL_TABLET | Freq: Every day | ORAL | 1 refills | Status: DC

## 2018-11-17 MED ORDER — MIRTAZAPINE 15 MG PO TABS: 15.0000 mg | ORAL_TABLET | Freq: Every day | ORAL | 1 refills | Status: DC

## 2018-11-17 NOTE — Progress Notes (Signed)
Name: Eugene Murphy   MRN: [id number]    DOB: July 09, 1932   Date:11/17/2018       Progress Note  Subjective  Chief Complaint  Chief Complaint  Patient presents with  . Follow-up    2 month   . Hypertension  . Asthma    HPI  Dementia  with behavior changes.major depression with psychotic features  : his health went down around Summer 2019  he started going to multiple doctors. Had abrupt onset of weight loss, his weight was in the 250's 2018, down 54 lbs in the past year.   He had nausea, weight loss, fatigue. Wife noticed a change in behavior feeling down, unable to sleep,  pacing all the time, and auditory  hallucinations  We started him on Citalopram and  Dr. Manuella Ghazi added Seroquel and his appetite and behavior has improved, but continues to lose weight. Symptoms are controlled at this time  BPH: we decreased dose of Cardura to half pill because of hypotension but wife states over the past two weeks he has been having worsening of nocturia during the night only, so she decided to go back to 8 mg daily. He denies dizziness.   HTN: now bp is low, he is off Vasotec, no chest pain or palpitation, he denies dizziness.   Malnutrition: he has lost another 14  lbs in the past 4 months. Total of 54 lbs in the past year. He has seen Dr. Mike Gip, MRI showed liver lesion possible benign but due for repeat test March 2020. Discussed the weight loss, explained that if it is positive for cancer would he like to be treated? He will think about it, he may go back to see her and decide at that visit. He is not sure what he wants to do at this time. He is not sure if he will follow up with Dr. Mike Gip.   Hyperlipidemia: taking medication and denies side effects, no chest pain or palpitation Unchanged   Anemia: under the care of Dr. Mike Gip   OSA: wearing CPAP every night, no headache, unchanged    Patient Active Problem List   Diagnosis Date Noted  . Dementia with behavioral disturbance ([place])  09/12/2018  . Weight loss 07/06/2018  . Abnormal computed tomography of large intestine   . Benign neoplasm of ascending colon   . Polyp of sigmoid colon   . Colon wall thickening 06/17/2018  . Lesion of liver 06/17/2018  . OSA on CPAP 12/31/2017  . GERD (gastroesophageal reflux disease) 07/25/2016  . Environmental and seasonal allergies 02/29/2016  . Elevated PSA 11/02/2015  . Hyperlipidemia 04/13/2015  . Hypertension 04/06/2015  . BPH with obstruction/lower urinary tract symptoms 04/06/2015  . Hypersomnia with sleep apnea 12/21/2013  . Asthma, mild intermittent 12/21/2013  . Chronic obstructive airway disease with asthma ([place]) 12/21/2013    Past Surgical History:  Procedure Laterality Date  . COLONOSCOPY WITH PROPOFOL N/A 06/19/2018   Procedure: COLONOSCOPY WITH PROPOFOL WITH BIOPSIES;  Surgeon: [name], MD;  Location: [facility];  Service: Endoscopy;  Laterality: N/A;  sleep apnea  . COSMETIC SURGERY Left    Left Arm Plastic Surgery in 1970s    Family History  Problem Relation Age of Onset  . Congestive Heart Failure Father   . Healthy Sister   . Dementia Brother     Social History   Socioeconomic History  . Marital status: Married    Spouse name: Flora  . Number of children: 3  . Years of  education: Not on file  . Highest education level: 9th grade  Occupational History  . Occupation: cab drivers    Comment: retired many years ago   Social Needs  . Financial resource strain: Not hard at all  . Food insecurity:    Worry: Never true    Inability: Never true  . Transportation needs:    Medical: No    Non-medical: Not on file  Tobacco Use  . Smoking status: Never Smoker  . Smokeless tobacco: Never Used  Substance and Sexual Activity  . Alcohol use: Yes    Alcohol/week: 0.0 standard drinks    Comment: Occasional  . Drug use: No  . Sexual activity: Yes    Partners: Female  Lifestyle  . Physical activity:    Days per week: 0 days    Minutes  per session: 0 min  . Stress: Not at all  Relationships  . Social connections:    Talks on phone: Once a week    Gets together: More than three times a week    Attends religious service: More than 4 times per year    Active member of club or organization: No    Attends meetings of clubs or organizations: Never    Relationship status: Married  . Intimate partner violence:    Fear of current or ex partner: No    Emotionally abused: No    Physically abused: No    Forced sexual activity: No  Other Topics Concern  . Not on file  Social History Narrative   He had 3 children prior to getting married, one died in a motorcycle accident    He has two grown children in [place] and one in [place] here from [place] after retirement      Current Outpatient Medications:  .  Albuterol Sulfate 108 (90 Base) MCG/ACT AEPB, Inhale 2 puffs into the lungs as needed. , Disp: , Rfl:  .  Ascorbic Acid (VITAMIN C) 1000 MG tablet, Take 500 mg by mouth daily. , Disp: , Rfl:  .  ASMANEX 30 METERED DOSES 220 MCG/INH inhaler, Inhale 2 puffs into the lungs as needed. , Disp: , Rfl:  .  azelastine (ASTELIN) 0.1 % nasal spray, Place 1 spray into both nostrils as needed. , Disp: , Rfl:  .  Cholecalciferol (VITAMIN D-3) 5000 units TABS, Take 1 tablet by mouth daily. , Disp: , Rfl:  .  citalopram (CELEXA) 20 MG tablet, TAKE 1 TABLET BY MOUTH EVERY DAY, Disp: 30 tablet, Rfl: 0 .  doxazosin (CARDURA) 8 MG tablet, Take 0.5 tablets (4 mg total) by mouth daily. (Patient taking differently: Take 8 mg by mouth daily. ), Disp: 90 tablet, Rfl: 3 .  dutasteride (AVODART) 0.5 MG capsule, Take 1 capsule (0.5 mg total) by mouth daily. (Patient taking differently: Take 0.5 mg by mouth daily. ), Disp: 90 capsule, Rfl: 3 .  fluticasone (FLONASE) 50 MCG/ACT nasal spray, , Disp: , Rfl:  .  lansoprazole (PREVACID) 30 MG capsule, Take 1 capsule (30 mg total) by mouth daily., Disp: 90 capsule, Rfl: 1 .  latanoprost (XALATAN)  0.005 % ophthalmic solution, Place 1 drop into both eyes at bedtime. , Disp: , Rfl:  .  levocetirizine (XYZAL) 5 MG tablet, Take 1 tablet (5 mg total) by mouth every evening., Disp: 90 tablet, Rfl: 1 .  mirtazapine (REMERON) 15 MG tablet, TAKE 1-2 TABLETS (15-30 MG TOTAL) BY MOUTH AT BEDTIME., Disp: 60 tablet, Rfl: 0 .  Potassium  99 MG TABS, Take 1 tablet by mouth daily at 8 pm., Disp: , Rfl:  .  pravastatin (PRAVACHOL) 40 MG tablet, Take 1 tablet (40 mg total) by mouth daily., Disp: 90 tablet, Rfl: 1 .  QUEtiapine (SEROQUEL) 25 MG tablet, Take 25 mg by mouth at bedtime., Disp: , Rfl:   Allergies  Allergen Reactions  . Shellfish Allergy Swelling    Throat swells    I personally reviewed active problem list, medication list, allergies, family history, social history with the patient/caregiver today.   ROS  Constitutional: Negative for fever, positive for  weight change.  Respiratory: Negative for cough and shortness of breath.   Cardiovascular: Negative for chest pain or palpitations.  Gastrointestinal: Negative for abdominal pain, no bowel changes.  Musculoskeletal: Negative for gait problem or joint swelling.  Skin: Negative for rash.  Neurological: Negative for dizziness or headache.  No other specific complaints in a complete review of systems (except as listed in HPI above).  Objective  Vitals:   11/17/18 1211  BP: 104/70  Pulse: (!) 105  Resp: 16  Temp: 98.3 F (36.8 C)  TempSrc: Oral  SpO2: 98%  Weight: 194 lb 4.8 oz (88.1 kg)  Height: 5\' 7"  (1.702 m)    Body mass index is 30.43 kg/m.  Physical Exam  Constitutional: Patient has some temporal waisting.  No distress.  HEENT: head atraumatic, normocephalic, pupils equal and reactive to light,  neck supple, throat within normal limits Cardiovascular: Normal rate, regular rhythm and normal heart sounds.  No murmur heard. No BLE edema. Pulmonary/Chest: Effort normal and breath sounds normal. No respiratory  distress. Abdominal: Soft.  There is no tenderness. Psychiatric: Patient has a normal mood and affect. Calm and cooperative.  Recent Results (from the past 2160 hour(s))  POCT urinalysis dipstick     Status: Normal   Collection Time: 08/20/18 11:57 AM  Result Value Ref Range   Color, UA yellow    Clarity, UA clear    Glucose, UA Negative Negative   Bilirubin, UA neg    Ketones, UA neg    Spec Grav, UA 1.015 1.010 - 1.025   Blood, UA neg    pH, UA 5.0 5.0 - 8.0   Protein, UA Negative Negative   Urobilinogen, UA 0.2 0.2 or 1.0 E.U./dL   Nitrite, UA neg    Leukocytes, UA Negative Negative   Appearance     Odor    Urine Culture     Status: None   Collection Time: 09/17/18  2:03 PM  Result Value Ref Range   MICRO NUMBER: [id number]    SPECIMEN QUALITY: Adequate    Sample Source URINE    STATUS: FINAL    Result: No Growth       PHQ2/9: Depression screen [facility] 2/9 09/17/2018 07/09/2018 06/17/2018 12/31/2017 10/02/2017  Decreased Interest 0 3 0 0 0  Down, Depressed, Hopeless 2 3 0 0 0  PHQ - 2 Score 2 6 0 0 0  Altered sleeping 0 3 0 - -  Tired, decreased energy 1 3 0 - -  Change in appetite 0 3 3 - -  Feeling bad or failure about yourself  0 3 0 - -  Trouble concentrating 0 3 0 - -  Moving slowly or fidgety/restless 0 3 0 - -  Suicidal thoughts 0 0 0 - -  PHQ-9 Score 3 24 3  - -  Difficult doing work/chores Not difficult at all Extremely dIfficult Not difficult at all - -  Fall Risk: Fall Risk  11/17/2018 09/17/2018 07/09/2018 06/17/2018 12/31/2017  Falls in the past year? 0 0 No No No  Number falls in past yr: 0 - - - -  Injury with Fall? 0 - - - -     Assessment & Plan  1. Vascular dementia with behavior disturbance ([place])  Seeing Dr. Manuella Ghazi   2. BPH without obstruction/lower urinary tract symptoms  States controlled with medication   3. Paranoia ([place])  Improved with medication   4. Severe major depression with psychotic features ([facility])  - citalopram (CELEXA)  20 MG tablet; Take 1 tablet (20 mg total) by mouth daily.  Dispense: 90 tablet; Refill: 1  5. Adrenal adenoma, unspecified laterality  On MRI  6. Essential hypertension  Low now, and off medication  7. Centrilobular emphysema ([place])  Seeing Dr. Raul Del   8. OSA on CPAP  He will have home study to adjust pressure , secondary to weight loss   9. Mild protein-calorie malnutrition ([place])  Discussed high protein diet   10. Gastroesophageal reflux disease, esophagitis presence not specified  Off medication   11. Dyslipidemia  - pravastatin (PRAVACHOL) 40 MG tablet; Take 1 tablet (40 mg total) by mouth daily.  Dispense: 90 tablet; Refill: 1  12. Other insomnia  - mirtazapine (REMERON) 15 MG tablet; Take 1 tablet (15 mg total) by mouth at bedtime.  Dispense: 90 tablet; Refill: 1

## 2018-11-18 ENCOUNTER — Ambulatory Visit: Payer: Medicare Other | Admitting: Urology

## 2018-11-19 ENCOUNTER — Ambulatory Visit: Payer: Medicare Other | Admitting: Urology

## 2018-11-26 ENCOUNTER — Other Ambulatory Visit: Payer: Self-pay | Admitting: Family Medicine

## 2018-11-26 DIAGNOSIS — F323 Major depressive disorder, single episode, severe with psychotic features: Secondary | ICD-10-CM

## 2018-12-05 ENCOUNTER — Encounter: Payer: Self-pay | Admitting: Urology

## 2018-12-05 ENCOUNTER — Ambulatory Visit: Payer: Medicare HMO | Admitting: Urology

## 2018-12-05 VITALS — BP 128/78 | HR 99 | Ht 67.0 in | Wt 189.9 lb

## 2018-12-05 DIAGNOSIS — N401 Enlarged prostate with lower urinary tract symptoms: Secondary | ICD-10-CM

## 2018-12-05 DIAGNOSIS — N138 Other obstructive and reflux uropathy: Secondary | ICD-10-CM | POA: Diagnosis not present

## 2018-12-05 LAB — BLADDER SCAN AMB NON-IMAGING

## 2018-12-05 MED ORDER — DOXAZOSIN MESYLATE 8 MG PO TABS: 8.0000 mg | ORAL_TABLET | Freq: Every day | ORAL | 3 refills | Status: DC

## 2018-12-05 MED ORDER — DUTASTERIDE 0.5 MG PO CAPS: 0.5000 mg | ORAL_CAPSULE | Freq: Every day | ORAL | 0 refills | Status: DC

## 2018-12-05 NOTE — Patient Instructions (Signed)

## 2018-12-05 NOTE — Progress Notes (Signed)
12/05/2018 3:24 PM   Eugene Murphy 1931-11-29 [id number]  Referring provider: Steele Sizer, MD [address] [place],  [address]  Chief Complaint  Patient presents with  . Follow-up    HPI: 85 year old male presents for follow-up of BPH.  He remains on doxazosin and dutasteride.  He was on 8 mg of doxazosin which was decreased to 4 mg due to hypotension.  He began to experience increased nocturia and his wife increased it back to 8 mg and he has had no problems with the medication.  He is taking dutasteride once per week.  At his visit in September 2019 his wife had stated he lost 20 pounds over 6 weeks.  He is being followed in oncology for liver lesions of undetermined etiology.  Denies dysuria, gross hematuria or flank/abdominal/pelvic/scrotal pain.   PMH: Past Medical History:  Diagnosis Date  . Arthritis    lower back  . Asthma   . BPH (benign prostatic hyperplasia)    Followed by Bonna Gains.  Marland Kitchen GERD (gastroesophageal reflux disease)   . Hyperlipidemia   . Hypertension   . Sleep apnea    CPAP    Surgical History: Past Surgical History:  Procedure Laterality Date  . COLONOSCOPY WITH PROPOFOL N/A 06/19/2018   Procedure: COLONOSCOPY WITH PROPOFOL WITH BIOPSIES;  Surgeon: [name], MD;  Location: [facility];  Service: Endoscopy;  Laterality: N/A;  sleep apnea  . COSMETIC SURGERY Left    Left Arm Plastic Surgery in 1970s    Home Medications:  Allergies as of 12/05/2018      Reactions   Shellfish Allergy Swelling   Throat swells      Medication List       Accurate as of December 05, 2018  3:24 PM. Always use your most recent med list.        Albuterol Sulfate 108 (90 Base) MCG/ACT Aepb Inhale 2 puffs into the lungs as needed.   ASMANEX (30 METERED DOSES) 220 MCG/INH inhaler Generic drug:  mometasone Inhale 2 puffs into the lungs as needed.   azelastine 0.1 % nasal spray Commonly known as:  ASTELIN Place 1 spray into  both nostrils as needed.   citalopram 20 MG tablet Commonly known as:  CELEXA Take 1 tablet (20 mg total) by mouth daily.   doxazosin 8 MG tablet Commonly known as:  CARDURA Take 0.5 tablets (4 mg total) by mouth daily.   dutasteride 0.5 MG capsule Commonly known as:  AVODART Take 1 capsule (0.5 mg total) by mouth daily.   fluticasone 50 MCG/ACT nasal spray Commonly known as:  FLONASE   lansoprazole 30 MG capsule Commonly known as:  PREVACID Take 1 capsule (30 mg total) by mouth daily.   latanoprost 0.005 % ophthalmic solution Commonly known as:  XALATAN Place 1 drop into both eyes at bedtime.   levocetirizine 5 MG tablet Commonly known as:  XYZAL Take 1 tablet (5 mg total) by mouth every evening.   mirtazapine 15 MG tablet Commonly known as:  REMERON Take 1 tablet (15 mg total) by mouth at bedtime.   Potassium 99 MG Tabs Take 1 tablet by mouth daily at 8 pm.   pravastatin 40 MG tablet Commonly known as:  PRAVACHOL Take 1 tablet (40 mg total) by mouth daily.   QUEtiapine 25 MG tablet Commonly known as:  SEROQUEL Take 25 mg by mouth 2 (two) times daily.   vitamin C 1000 MG tablet Take 500 mg by mouth daily.  Vitamin D-3 125 MCG (5000 UT) Tabs Take 1 tablet by mouth daily.       Allergies:  Allergies  Allergen Reactions  . Shellfish Allergy Swelling    Throat swells    Family History: Family History  Problem Relation Age of Onset  . Congestive Heart Failure Father   . Healthy Sister   . Dementia Brother     Social History:  reports that he has never smoked. He has never used smokeless tobacco. He reports current alcohol use. He reports that he does not use drugs.  ROS: UROLOGY Frequent Urination?: Yes Hard to postpone urination?: No Burning/pain with urination?: No Get up at night to urinate?: Yes Leakage of urine?: No Urine stream starts and stops?: No Trouble starting stream?: No Do you have to strain to urinate?: No Blood in urine?:  No Urinary tract infection?: No Sexually transmitted disease?: No Injury to kidneys or bladder?: No Painful intercourse?: No Weak stream?: No Erection problems?: No Penile pain?: No  Gastrointestinal Nausea?: No Vomiting?: No Indigestion/heartburn?: No Diarrhea?: No Constipation?: No  Constitutional Fever: No Night sweats?: No Weight loss?: No Fatigue?: No  Skin Skin rash/lesions?: No Itching?: No  Eyes Blurred vision?: No Double vision?: No  Ears/Nose/Throat Sore throat?: No Sinus problems?: No  Hematologic/Lymphatic Swollen glands?: No Easy bruising?: No  Cardiovascular Leg swelling?: No Chest pain?: No  Respiratory Cough?: No Shortness of breath?: No  Endocrine Excessive thirst?: No  Musculoskeletal Back pain?: No Joint pain?: No  Neurological Headaches?: No Dizziness?: No  Psychologic Depression?: No Anxiety?: No  Physical Exam: BP 128/78 (BP Location: Left Arm, Patient Position: Sitting, Cuff Size: Large)   Pulse 99   Ht 5\' 7"  (1.702 m)   Wt 189 lb 14.4 oz (86.1 kg)   BMI 29.74 kg/m   Constitutional:  Alert and oriented, No acute distress. HEENT: [place] AT, moist mucus membranes.  Trachea midline, no masses. Cardiovascular: No clubbing, cyanosis, or edema. Respiratory: Normal respiratory effort, no increased work of breathing. GI: Abdomen is soft, nontender, nondistended, no abdominal masses Skin: No rashes, bruises or suspicious lesions. Psychiatric: Normal mood    Assessment & Plan:    1. BPH with obstruction/lower urinary tract symptoms Stable lower urinary tract symptoms on combination therapy.  He is only taking dutasteride once per week.  PVR by bladder scan today was 20 mL.  I informed his wife that if he has any issues with doxazosin would change to a prostate-specific alpha-blocker such as silodosin.  His dutasteride and doxazosin were refilled.  Continue annual follow-up.   Abbie Sons, [place] [address], [place], [place] [address] [phone]

## 2018-12-07 ENCOUNTER — Encounter: Payer: Self-pay | Admitting: Urology

## 2018-12-22 DIAGNOSIS — R69 Illness, unspecified: Secondary | ICD-10-CM | POA: Diagnosis not present

## 2018-12-22 DIAGNOSIS — G4733 Obstructive sleep apnea (adult) (pediatric): Secondary | ICD-10-CM | POA: Diagnosis not present

## 2019-01-01 ENCOUNTER — Other Ambulatory Visit: Payer: Self-pay | Admitting: Family Medicine

## 2019-01-01 DIAGNOSIS — K219 Gastro-esophageal reflux disease without esophagitis: Secondary | ICD-10-CM

## 2019-01-01 NOTE — Telephone Encounter (Signed)
Copied from [place] [phone]. Topic: Quick Communication - Rx Refill/Question >> Jan 01, 2019  4:14 PM Richardo Priest, [place] wrote: Medication:  lansoprazole (PREVACID) 30 MG capsule  Has the patient contacted their pharmacy? Yes. Patient's wife called stating patient is in need of a 90 day supply of this medication.   Preferred Pharmacy (with phone number or street name):    Agent: Please be advised that RX refills may take up to 3 business days. We ask that you follow-up with your pharmacy.

## 2019-01-02 ENCOUNTER — Other Ambulatory Visit: Payer: Self-pay | Admitting: Family Medicine

## 2019-01-02 DIAGNOSIS — K219 Gastro-esophageal reflux disease without esophagitis: Secondary | ICD-10-CM

## 2019-01-02 MED ORDER — LANSOPRAZOLE 30 MG PO CPDR: 30.0000 mg | DELAYED_RELEASE_CAPSULE | Freq: Every day | ORAL | 1 refills | Status: DC

## 2019-01-02 NOTE — Addendum Note (Signed)
Addended by: Chilton Greathouse on: 01/02/2019 10:40 AM   Modules accepted: Orders

## 2019-01-02 NOTE — Telephone Encounter (Signed)
Refill request for general medication: Prevacid 30 mg  Last office visit: 11/17/2018  Follow-ups on file. 05/18/2019

## 2019-03-04 DIAGNOSIS — G4733 Obstructive sleep apnea (adult) (pediatric): Secondary | ICD-10-CM | POA: Diagnosis not present

## 2019-03-07 DIAGNOSIS — G4733 Obstructive sleep apnea (adult) (pediatric): Secondary | ICD-10-CM | POA: Diagnosis not present

## 2019-03-24 DIAGNOSIS — G4733 Obstructive sleep apnea (adult) (pediatric): Secondary | ICD-10-CM | POA: Diagnosis not present

## 2019-03-24 DIAGNOSIS — R634 Abnormal weight loss: Secondary | ICD-10-CM | POA: Diagnosis not present

## 2019-03-24 DIAGNOSIS — J449 Chronic obstructive pulmonary disease, unspecified: Secondary | ICD-10-CM | POA: Diagnosis not present

## 2019-03-26 DIAGNOSIS — R69 Illness, unspecified: Secondary | ICD-10-CM | POA: Diagnosis not present

## 2019-03-26 DIAGNOSIS — R079 Chest pain, unspecified: Secondary | ICD-10-CM | POA: Diagnosis not present

## 2019-03-26 DIAGNOSIS — G4710 Hypersomnia, unspecified: Secondary | ICD-10-CM | POA: Diagnosis not present

## 2019-03-26 DIAGNOSIS — J449 Chronic obstructive pulmonary disease, unspecified: Secondary | ICD-10-CM | POA: Diagnosis not present

## 2019-03-26 DIAGNOSIS — G4730 Sleep apnea, unspecified: Secondary | ICD-10-CM | POA: Diagnosis not present

## 2019-03-26 DIAGNOSIS — I10 Essential (primary) hypertension: Secondary | ICD-10-CM | POA: Diagnosis not present

## 2019-05-04 DIAGNOSIS — H401131 Primary open-angle glaucoma, bilateral, mild stage: Secondary | ICD-10-CM | POA: Diagnosis not present

## 2019-05-07 ENCOUNTER — Other Ambulatory Visit: Payer: Self-pay | Admitting: Family Medicine

## 2019-05-07 DIAGNOSIS — F323 Major depressive disorder, single episode, severe with psychotic features: Secondary | ICD-10-CM

## 2019-05-07 DIAGNOSIS — G4709 Other insomnia: Secondary | ICD-10-CM

## 2019-05-07 MED ORDER — MIRTAZAPINE 15 MG PO TABS: 15.0000 mg | ORAL_TABLET | Freq: Every day | ORAL | 0 refills | Status: DC

## 2019-05-07 MED ORDER — CITALOPRAM HYDROBROMIDE 20 MG PO TABS: 20.0000 mg | ORAL_TABLET | Freq: Every day | ORAL | 0 refills | Status: DC

## 2019-05-07 NOTE — Telephone Encounter (Signed)
Medication: citalopram (CELEXA) 20 MG tablet [[id number]]   mirtazapine (REMERON) 15 MG tablet [[id number]]   Pharmacy: [facility], [place]

## 2019-05-07 NOTE — Telephone Encounter (Signed)
Refill request for general medication.citalopram (CELEXA) 20 MG tablet [[id number]]   mirtazapine (REMERON) 15 MG tablet [[id number]]   Last office visit 11/17/2018   Follow up on 06/30/2019

## 2019-05-08 MED ORDER — MIRTAZAPINE 15 MG PO TABS: 15.0000 mg | ORAL_TABLET | Freq: Every day | ORAL | 0 refills | Status: DC

## 2019-05-08 MED ORDER — CITALOPRAM HYDROBROMIDE 20 MG PO TABS: 20.0000 mg | ORAL_TABLET | Freq: Every day | ORAL | 0 refills | Status: DC

## 2019-05-08 NOTE — Telephone Encounter (Signed)
Patient's wife called in stating [facility]. [facility]

## 2019-05-08 NOTE — Addendum Note (Signed)
Addended by: Inda Coke on: 05/08/2019 10:07 AM   Modules accepted: Orders

## 2019-05-18 ENCOUNTER — Ambulatory Visit: Payer: Medicare HMO | Admitting: Family Medicine

## 2019-05-18 DIAGNOSIS — H401131 Primary open-angle glaucoma, bilateral, mild stage: Secondary | ICD-10-CM | POA: Diagnosis not present

## 2019-06-03 ENCOUNTER — Other Ambulatory Visit: Payer: Self-pay | Admitting: Urology

## 2019-06-03 DIAGNOSIS — N138 Other obstructive and reflux uropathy: Secondary | ICD-10-CM

## 2019-06-03 MED ORDER — DUTASTERIDE 0.5 MG PO CAPS: 0.5000 mg | ORAL_CAPSULE | Freq: Every day | ORAL | 3 refills | Status: DC

## 2019-06-03 NOTE — Telephone Encounter (Signed)
RX filled, patient notified.

## 2019-06-03 NOTE — Telephone Encounter (Signed)
Patient is asking for a 90 day supply refill on his AVODART) [facility]    Sharyn Lull

## 2019-06-30 ENCOUNTER — Encounter: Payer: Self-pay | Admitting: Family Medicine

## 2019-06-30 ENCOUNTER — Other Ambulatory Visit: Payer: Self-pay

## 2019-06-30 ENCOUNTER — Ambulatory Visit: Payer: Medicare HMO | Admitting: Family Medicine

## 2019-06-30 VITALS — BP 122/76 | HR 93 | Temp 96.8°F | Resp 16 | Ht 67.0 in | Wt 176.1 lb

## 2019-06-30 DIAGNOSIS — Z8679 Personal history of other diseases of the circulatory system: Secondary | ICD-10-CM

## 2019-06-30 DIAGNOSIS — Z23 Encounter for immunization: Secondary | ICD-10-CM

## 2019-06-30 DIAGNOSIS — K219 Gastro-esophageal reflux disease without esophagitis: Secondary | ICD-10-CM

## 2019-06-30 DIAGNOSIS — Z79899 Other long term (current) drug therapy: Secondary | ICD-10-CM | POA: Diagnosis not present

## 2019-06-30 DIAGNOSIS — G4709 Other insomnia: Secondary | ICD-10-CM

## 2019-06-30 DIAGNOSIS — D649 Anemia, unspecified: Secondary | ICD-10-CM | POA: Diagnosis not present

## 2019-06-30 DIAGNOSIS — F01518 Vascular dementia, unspecified severity, with other behavioral disturbance: Secondary | ICD-10-CM

## 2019-06-30 DIAGNOSIS — F323 Major depressive disorder, single episode, severe with psychotic features: Secondary | ICD-10-CM

## 2019-06-30 DIAGNOSIS — R69 Illness, unspecified: Secondary | ICD-10-CM | POA: Diagnosis not present

## 2019-06-30 DIAGNOSIS — E785 Hyperlipidemia, unspecified: Secondary | ICD-10-CM | POA: Diagnosis not present

## 2019-06-30 DIAGNOSIS — F0151 Vascular dementia with behavioral disturbance: Secondary | ICD-10-CM | POA: Diagnosis not present

## 2019-06-30 DIAGNOSIS — J432 Centrilobular emphysema: Secondary | ICD-10-CM

## 2019-06-30 MED ORDER — QUETIAPINE FUMARATE 25 MG PO TABS: 25.0000 mg | ORAL_TABLET | Freq: Two times a day (BID) | ORAL | 1 refills | Status: DC

## 2019-06-30 MED ORDER — CITALOPRAM HYDROBROMIDE 20 MG PO TABS: 20.0000 mg | ORAL_TABLET | Freq: Every day | ORAL | 1 refills | Status: DC

## 2019-06-30 MED ORDER — LANSOPRAZOLE 30 MG PO CPDR: 30.0000 mg | DELAYED_RELEASE_CAPSULE | Freq: Every day | ORAL | 1 refills | Status: DC

## 2019-06-30 MED ORDER — MIRTAZAPINE 15 MG PO TABS: 15.0000 mg | ORAL_TABLET | Freq: Every day | ORAL | 1 refills | Status: DC

## 2019-06-30 MED ORDER — PRAVASTATIN SODIUM 40 MG PO TABS: 40.0000 mg | ORAL_TABLET | Freq: Every day | ORAL | 1 refills | Status: DC

## 2019-06-30 NOTE — Progress Notes (Signed)
Name: Eugene Murphy   MRN: [id number]    DOB: 05-21-1932   Date:06/30/2019       Progress Note  Subjective  Chief Complaint  Chief Complaint  Patient presents with  . Medication Refill  . Hypertension  . Dementiawith behavior changes.major depression with psycho  . Benign Prostatic Hypertrophy  . Sleep Apnea  . Hyperlipidemia  . Anemia    HPI  Dementiawith behavior changes.major depression with psychotic features : his healthwent down around Summer 2019he started goingto multiple doctors. Had abrupt onset of weight lossHe initially hadnausea, weight loss, fatigue. Wife noticed a change in behaviorfeeling down, unable to sleep,pacing all the time, and auditory hallucinations.  We started him on Citalopram and  Dr. Manuella Ghazi added Seroquel and his appetite and behavior has improved, but continues to lose weight. Currently not having any auditory hallucinations but woke a few times agitated and throwing things, no wife removed everything from night stand and he seems to be doing okay .   BPH: we decreased dose of Cardura to half pill because of hypotension but wife states over the past two weeks he has been having worsening of nocturia during the night only, so she decided to go back to 8 mg daily. He denies dizziness.   HTN: bp is at goal, he is off medication, lost 32 lbs in the past year,  no chest pain or palpitation, he denies dizziness.   Malnutrition: he has lost 32 lbs since October 2019 ( weight on 07/09/2018 was 208 lbs) ,  and 70 lbs in the past 2 years, he is eating well , no diarrhea, he drinks protein shakes and is still losing weight. He lost follow up with Dr. Mike Gip because she is now in [place],. He had multiple studies including CT abdomen and pelvis and had colonoscopy but he states he is not interested in being treated for cancer   Hyperlipidemia: taking medication and denies side effects, no chest pain or palpitation  Unchanged    Anemia: under the  care of Dr. Mike Gip , but lost to follow up  OSA: he has lost 32 lbs in the past year, he states he woke up one night and threw it on the wall and feels like it is dangerous to wear it now. Since he lost weight he may be okay without it . He states he is afraid of having anything that he can throw around him during the night, not sure if semiconscious mode. He states it is like he is having vivid dream and he wakes up throwing something.   BPH: under the care of Dr. Bernardo Heater and symptoms are okay with medication   Patient Active Problem List   Diagnosis Date Noted  . Secondary malignant neoplasm of liver and intrahepatic bile duct ([facility]) 11/17/2018  . Dementia with behavioral disturbance ([facility]) 09/12/2018  . Weight loss 07/06/2018  . Abnormal computed tomography of large intestine   . Benign neoplasm of ascending colon   . Polyp of sigmoid colon   . Colon wall thickening 06/17/2018  . Lesion of liver 06/17/2018  . OSA on CPAP 12/31/2017  . GERD (gastroesophageal reflux disease) 07/25/2016  . Environmental and seasonal allergies 02/29/2016  . Elevated PSA 11/02/2015  . Hyperlipidemia 04/13/2015  . Hypertension 04/06/2015  . BPH with obstruction/lower urinary tract symptoms 04/06/2015  . Hypersomnia with sleep apnea 12/21/2013  . Asthma, mild intermittent 12/21/2013  . Chronic obstructive airway disease with asthma ([place]) 12/21/2013    Past Surgical History:  Procedure Laterality Date  . COLONOSCOPY WITH PROPOFOL N/A 06/19/2018   Procedure: COLONOSCOPY WITH PROPOFOL WITH BIOPSIES;  Surgeon: [name], MD;  Location: [facility];  Service: Endoscopy;  Laterality: N/A;  sleep apnea  . COSMETIC SURGERY Left    Left Arm Plastic Surgery in 1970s    Family History  Problem Relation Age of Onset  . Congestive Heart Failure Father   . Healthy Sister   . Dementia Brother     Social History   Socioeconomic History  . Marital status: Married    Spouse name: Flora  . Number  of children: 3  . Years of education: Not on file  . Highest education level: 9th grade  Occupational History  . Occupation: cab drivers    Comment: retired many years ago   Social Needs  . Financial resource strain: Not hard at all  . Food insecurity    Worry: Never true    Inability: Never true  . Transportation needs    Medical: No    Non-medical: No  Tobacco Use  . Smoking status: Never Smoker  . Smokeless tobacco: Never Used  Substance and Sexual Activity  . Alcohol use: Yes    Alcohol/week: 0.0 standard drinks    Comment: Occasional  . Drug use: No  . Sexual activity: Yes    Partners: Female  Lifestyle  . Physical activity    Days per week: 0 days    Minutes per session: 0 min  . Stress: Not at all  Relationships  . Social Herbalist on phone: Once a week    Gets together: More than three times a week    Attends religious service: More than 4 times per year    Active member of club or organization: No    Attends meetings of clubs or organizations: Never    Relationship status: Married  . Intimate partner violence    Fear of current or ex partner: No    Emotionally abused: No    Physically abused: No    Forced sexual activity: No  Other Topics Concern  . Not on file  Social History Narrative   He had 3 children prior to getting married, one died in a motorcycle accident    He has two grown children in [place] and one in [place] here from [place] after retirement      Current Outpatient Medications:  .  Albuterol Sulfate 108 (90 Base) MCG/ACT AEPB, Inhale 2 puffs into the lungs as needed. , Disp: , Rfl:  .  Ascorbic Acid (VITAMIN C) 1000 MG tablet, Take 500 mg by mouth daily. , Disp: , Rfl:  .  ASMANEX 30 METERED DOSES 220 MCG/INH inhaler, Inhale 2 puffs into the lungs as needed. , Disp: , Rfl:  .  azelastine (ASTELIN) 0.1 % nasal spray, Place 1 spray into both nostrils as needed. , Disp: , Rfl:  .  Cholecalciferol (VITAMIN D-3) 5000  units TABS, Take 1 tablet by mouth daily. , Disp: , Rfl:  .  citalopram (CELEXA) 20 MG tablet, Take 1 tablet (20 mg total) by mouth daily., Disp: 90 tablet, Rfl: 0 .  doxazosin (CARDURA) 8 MG tablet, Take 1 tablet (8 mg total) by mouth daily., Disp: 90 tablet, Rfl: 3 .  dutasteride (AVODART) 0.5 MG capsule, Take 1 capsule (0.5 mg total) by mouth daily., Disp: 90 capsule, Rfl: 3 .  fluticasone (FLONASE) 50 MCG/ACT nasal spray, , Disp: , Rfl:  .  lansoprazole (PREVACID) 30 MG capsule, Take 1 capsule (30 mg total) by mouth daily., Disp: 90 capsule, Rfl: 1 .  latanoprost (XALATAN) 0.005 % ophthalmic solution, Place 1 drop into both eyes at bedtime. , Disp: , Rfl:  .  levocetirizine (XYZAL) 5 MG tablet, Take 1 tablet (5 mg total) by mouth every evening., Disp: 90 tablet, Rfl: 1 .  mirtazapine (REMERON) 15 MG tablet, Take 1 tablet (15 mg total) by mouth at bedtime., Disp: 90 tablet, Rfl: 0 .  Potassium 99 MG TABS, Take 1 tablet by mouth daily at 8 pm., Disp: , Rfl:  .  pravastatin (PRAVACHOL) 40 MG tablet, Take 1 tablet (40 mg total) by mouth daily., Disp: 90 tablet, Rfl: 1 .  QUEtiapine (SEROQUEL) 25 MG tablet, Take 25 mg by mouth 2 (two) times daily. , Disp: , Rfl:   Allergies  Allergen Reactions  . Shellfish Allergy Swelling    Throat swells    I personally reviewed active problem list, medication list, allergies, family history, social history, health maintenance with the patient/caregiver today.   ROS  Constitutional: Negative for fever, positive for  weight change.  Respiratory: Negative for cough and shortness of breath.   Cardiovascular: Negative for chest pain or palpitations.  Gastrointestinal: Negative for abdominal pain, no bowel changes.  Musculoskeletal: Negative for gait problem or joint swelling.  Skin: Negative for rash.  Neurological: Negative for dizziness or headache.  No other specific complaints in a complete review of systems (except as listed in HPI  above).  Objective  Vitals:   06/30/19 1007  BP: 122/76  Pulse: 93  Resp: 16  Temp: (!) 96.8 F (36 C)  TempSrc: Temporal  SpO2: 98%  Weight: 176 lb 1.6 oz (79.9 kg)  Height: 5\' 7"  (1.702 m)    Body mass index is 27.58 kg/m.  Physical Exam  Constitutional: Patient appears well-developed and looks malnourished, shirt very big for him, temporal waisting  No distress.  HEENT: head atraumatic, normocephalic, pupils equal and reactive to light Cardiovascular: Normal rate, regular rhythm and normal heart sounds.  No murmur heard. No BLE edema. Pulmonary/Chest: Effort normal and breath sounds normal. No respiratory distress. Abdominal: Soft.  There is no tenderness. Psychiatric: Patient has a normal mood and affect. behavior is normal. Judgment and thought content   normal. PHQ2/9: Depression screen [facility] 2/9 06/30/2019 09/17/2018 07/09/2018 06/17/2018 12/31/2017  Decreased Interest 0 0 3 0 0  Down, Depressed, Hopeless 0 2 3 0 0  PHQ - 2 Score 0 2 6 0 0  Altered sleeping 0 0 3 0 -  Tired, decreased energy 0 1 3 0 -  Change in appetite 0 0 3 3 -  Feeling bad or failure about yourself  0 0 3 0 -  Trouble concentrating 0 0 3 0 -  Moving slowly or fidgety/restless 0 0 3 0 -  Suicidal thoughts 0 0 0 0 -  PHQ-9 Score 0 3 24 3  -  Difficult doing work/chores Not difficult at all Not difficult at all Extremely dIfficult Not difficult at all -    phq 9 is negative   Fall Risk: Fall Risk  06/30/2019 11/17/2018 09/17/2018 07/09/2018 06/17/2018  Falls in the past year? 0 0 0 No No  Number falls in past yr: 0 0 - - -  Injury with Fall? 0 0 - - -    Functional Status Survey: Is the patient deaf or have difficulty hearing?: Yes Does the patient have difficulty seeing, even when wearing  glasses/contacts?: Yes Does the patient have difficulty concentrating, remembering, or making decisions?: Yes Does the patient have difficulty walking or climbing stairs?: Yes Does the patient have  difficulty dressing or bathing?: Yes Does the patient have difficulty doing errands alone such as visiting a doctor's office or shopping?: Yes   Assessment & Plan  1. Need for immunization against influenza  - Flu Vaccine QUAD High Dose(Fluad)  2. Other insomnia  - mirtazapine (REMERON) 15 MG tablet; Take 1 tablet (15 mg total) by mouth at bedtime.  Dispense: 90 tablet; Refill: 1  3. Severe major depression with psychotic features (HCC)  - citalopram (CELEXA) 20 MG tablet; Take 1 tablet (20 mg total) by mouth daily.  Dispense: 90 tablet; Refill: 1  4. Gastroesophageal reflux disease, esophagitis presence not specified  - lansoprazole (PREVACID) 30 MG capsule; Take 1 capsule (30 mg total) by mouth daily.  Dispense: 90 capsule; Refill: 1  5. Dyslipidemia  - pravastatin (PRAVACHOL) 40 MG tablet; Take 1 tablet (40 mg total) by mouth daily.  Dispense: 90 tablet; Refill: 1 - Lipid panel  6. Vascular dementia with behavior disturbance (HCC)  stable  7. History of hypertension  Resolved with weight loss  8. Centrilobular emphysema ([facility])  Doing well, no cough or wheezing  9. Anemia, unspecified type   10. Hypercalcemia  - Parathyroid hormone, intact (no Ca)  11. Long-term use of high-risk medication  - COMPLETE METABOLIC PANEL WITH GFR - CBC with Differential/Platelet

## 2019-07-01 LAB — COMPLETE METABOLIC PANEL WITH GFR
AG Ratio: 1.8 (calc) (ref 1.0–2.5)
ALT: 26 U/L (ref 9–46)
AST: 22 U/L (ref 10–35)
Albumin: 4.4 g/dL (ref 3.6–5.1)
Alkaline phosphatase (APISO): 38 U/L (ref 35–144)
BUN: 22 mg/dL (ref 7–25)
CO2: 27 mmol/L (ref 20–32)
Calcium: 10.1 mg/dL (ref 8.6–10.3)
Chloride: 103 mmol/L (ref 98–110)
Creat: 1.01 mg/dL (ref 0.70–1.11)
GFR, Est African American: 78 mL/min/{1.73_m2} (ref >=60)
GFR, Est Non African American: 68 mL/min/{1.73_m2} (ref >=60)
Globulin: 2.4 g/dL (calc) (ref 1.9–3.7)
Glucose, Bld: 82 mg/dL (ref 65–99)
Potassium: 4.5 mmol/L (ref 3.5–5.3)
Sodium: 139 mmol/L (ref 135–146)
Total Bilirubin: 0.6 mg/dL (ref 0.2–1.2)
Total Protein: 6.8 g/dL (ref 6.1–8.1)

## 2019-07-01 LAB — CBC WITH DIFFERENTIAL/PLATELET
Absolute Monocytes: 521 cells/uL (ref 200–950)
Basophils Absolute: 21 cells/uL (ref 0–200)
Basophils Relative: 0.5 %
Eosinophils Absolute: 139 cells/uL (ref 15–500)
Eosinophils Relative: 3.4 %
HCT: 37.1 % — ABNORMAL LOW (ref 38.5–50.0)
Hemoglobin: 12.6 g/dL — ABNORMAL LOW (ref 13.2–17.1)
Lymphs Abs: 1328 cells/uL (ref 850–3900)
MCH: 31.6 pg (ref 27.0–33.0)
MCHC: 34 g/dL (ref 32.0–36.0)
MCV: 93 fL (ref 80.0–100.0)
MPV: 11.2 fL (ref 7.5–12.5)
Monocytes Relative: 12.7 %
Neutro Abs: 2091 cells/uL (ref 1500–7800)
Neutrophils Relative %: 51 %
Platelets: 191 10*3/uL (ref 140–400)
RBC: 3.99 10*6/uL — ABNORMAL LOW (ref 4.20–5.80)
RDW: 11.8 % (ref 11.0–15.0)
Total Lymphocyte: 32.4 %
WBC: 4.1 10*3/uL (ref 3.8–10.8)

## 2019-07-01 LAB — LIPID PANEL
Cholesterol: 125 mg/dL (ref <200)
HDL: 61 mg/dL (ref >=40)
LDL Cholesterol (Calc): 54 mg/dL (calc)
Non-HDL Cholesterol (Calc): 64 mg/dL (calc) (ref <130)
Total CHOL/HDL Ratio: 2 (calc) (ref <5.0)
Triglycerides: 36 mg/dL (ref <150)

## 2019-07-01 LAB — PARATHYROID HORMONE, INTACT (NO CA): PTH: 20 pg/mL (ref 14–64)

## 2019-07-22 DIAGNOSIS — R69 Illness, unspecified: Secondary | ICD-10-CM | POA: Diagnosis not present

## 2019-07-22 DIAGNOSIS — G4733 Obstructive sleep apnea (adult) (pediatric): Secondary | ICD-10-CM | POA: Diagnosis not present

## 2019-09-07 ENCOUNTER — Other Ambulatory Visit: Payer: Self-pay | Admitting: Urology

## 2019-09-07 DIAGNOSIS — R634 Abnormal weight loss: Secondary | ICD-10-CM

## 2019-09-22 DIAGNOSIS — R0609 Other forms of dyspnea: Secondary | ICD-10-CM | POA: Diagnosis not present

## 2019-09-22 DIAGNOSIS — R634 Abnormal weight loss: Secondary | ICD-10-CM | POA: Diagnosis not present

## 2019-09-22 DIAGNOSIS — R0600 Dyspnea, unspecified: Secondary | ICD-10-CM | POA: Diagnosis not present

## 2019-10-06 DIAGNOSIS — J449 Chronic obstructive pulmonary disease, unspecified: Secondary | ICD-10-CM | POA: Diagnosis not present

## 2019-10-06 DIAGNOSIS — I10 Essential (primary) hypertension: Secondary | ICD-10-CM | POA: Diagnosis not present

## 2019-10-06 DIAGNOSIS — R69 Illness, unspecified: Secondary | ICD-10-CM | POA: Diagnosis not present

## 2019-11-12 ENCOUNTER — Other Ambulatory Visit: Payer: Self-pay | Admitting: *Deleted

## 2019-11-12 ENCOUNTER — Telehealth: Payer: Self-pay | Admitting: Urology

## 2019-11-12 DIAGNOSIS — N138 Other obstructive and reflux uropathy: Secondary | ICD-10-CM

## 2019-11-12 MED ORDER — DOXAZOSIN MESYLATE 8 MG PO TABS: 8.0000 mg | ORAL_TABLET | Freq: Every day | ORAL | 3 refills | Status: DC

## 2019-11-12 NOTE — Telephone Encounter (Signed)
Pt requests a refill for Cardura( doxazosin) sent to [facility].

## 2019-12-11 ENCOUNTER — Encounter: Payer: Self-pay | Admitting: Urology

## 2019-12-11 ENCOUNTER — Ambulatory Visit: Payer: Medicare HMO | Admitting: Urology

## 2019-12-11 ENCOUNTER — Other Ambulatory Visit: Payer: Self-pay

## 2019-12-11 VITALS — BP 154/90 | HR 87 | Ht 67.0 in | Wt 180.0 lb

## 2019-12-11 DIAGNOSIS — N138 Other obstructive and reflux uropathy: Secondary | ICD-10-CM | POA: Diagnosis not present

## 2019-12-11 DIAGNOSIS — N401 Enlarged prostate with lower urinary tract symptoms: Secondary | ICD-10-CM | POA: Diagnosis not present

## 2019-12-11 LAB — BLADDER SCAN AMB NON-IMAGING: Scan Result: 0

## 2019-12-11 MED ORDER — DUTASTERIDE 0.5 MG PO CAPS: 0.5000 mg | ORAL_CAPSULE | Freq: Every day | ORAL | 3 refills | Status: DC

## 2019-12-11 MED ORDER — DOXAZOSIN MESYLATE 8 MG PO TABS: 8.0000 mg | ORAL_TABLET | Freq: Every day | ORAL | 3 refills | Status: DC

## 2019-12-11 NOTE — Progress Notes (Signed)
12/11/2019 1:35 PM   Eugene Murphy 10-16-1931 [id number]  Referring provider: Steele Sizer, MD [address] [place],  [place] [address]  Chief Complaint  Patient presents with  . Benign Prostatic Hypertrophy    HPI: 86 y.o. male presents for annual follow-up of BPH  -Remains on doxazosin/dutasteride -Dose doxazosin decreased 4 mg last year secondary to hypotension -His wife noted increased nocturia last Fall and increased dose back to 8 mg which she has tolerated well -No voiding complaints today -Denies dysuria, gross hematuria -No flank, abdominal or pelvic pain   PMH: Past Medical History:  Diagnosis Date  . Arthritis    lower back  . Asthma   . BPH (benign prostatic hyperplasia)    Followed by Bonna Gains.  Marland Kitchen GERD (gastroesophageal reflux disease)   . Hyperlipidemia   . Hypertension   . Sleep apnea    CPAP    Surgical History: Past Surgical History:  Procedure Laterality Date  . COLONOSCOPY WITH PROPOFOL N/A 06/19/2018   Procedure: COLONOSCOPY WITH PROPOFOL WITH BIOPSIES;  Surgeon: [name], MD;  Location: [facility];  Service: Endoscopy;  Laterality: N/A;  sleep apnea  . COSMETIC SURGERY Left    Left Arm Plastic Surgery in 1970s    Home Medications:  Allergies as of 12/11/2019      Reactions   Shellfish Allergy Swelling   Throat swells      Medication List       Accurate as of December 11, 2019  1:35 PM. If you have any questions, ask your nurse or doctor.        Albuterol Sulfate 108 (90 Base) MCG/ACT Aepb Inhale 2 puffs into the lungs as needed.   Asmanex (30 Metered Doses) 220 MCG/INH inhaler Generic drug: mometasone Inhale 2 puffs into the lungs as needed.   azelastine 0.1 % nasal spray Commonly known as: ASTELIN Place 1 spray into both nostrils as needed.   citalopram 20 MG tablet Commonly known as: CELEXA Take 1 tablet (20 mg total) by mouth daily.   donepezil 5 MG tablet Commonly known as: ARICEPT Take  by mouth.   doxazosin 8 MG tablet Commonly known as: CARDURA Take 1 tablet (8 mg total) by mouth daily.   dutasteride 0.5 MG capsule Commonly known as: Avodart Take 1 capsule (0.5 mg total) by mouth daily.   fluticasone 50 MCG/ACT nasal spray Commonly known as: FLONASE   lansoprazole 30 MG capsule Commonly known as: PREVACID Take 1 capsule (30 mg total) by mouth daily.   latanoprost 0.005 % ophthalmic solution Commonly known as: XALATAN Place 1 drop into both eyes at bedtime.   levocetirizine 5 MG tablet Commonly known as: XYZAL Take 1 tablet (5 mg total) by mouth every evening.   mirtazapine 15 MG tablet Commonly known as: REMERON Take 1 tablet (15 mg total) by mouth at bedtime.   Potassium 99 MG Tabs Take 1 tablet by mouth daily at 8 pm.   pravastatin 40 MG tablet Commonly known as: PRAVACHOL Take 1 tablet (40 mg total) by mouth daily.   QUEtiapine 25 MG tablet Commonly known as: SEROQUEL Take 1 tablet (25 mg total) by mouth 2 (two) times daily.   vitamin C 1000 MG tablet Take 500 mg by mouth daily.   Vitamin D-3 125 MCG (5000 UT) Tabs Take 1 tablet by mouth daily.       Allergies:  Allergies  Allergen Reactions  . Shellfish Allergy Swelling    Throat swells  Family History: Family History  Problem Relation Age of Onset  . Congestive Heart Failure Father   . Healthy Sister   . Dementia Brother     Social History:  reports that he has never smoked. He has never used smokeless tobacco. He reports current alcohol use. He reports that he does not use drugs.   Physical Exam: BP (!) 154/90   Pulse 87   Ht 5\' 7"  (1.702 m)   Wt 180 lb (81.6 kg)   BMI 28.19 kg/m   Constitutional:  Alert, No acute distress. HEENT: [place] AT, moist mucus membranes.  Trachea midline, no masses. Cardiovascular: No clubbing, cyanosis, or edema. Respiratory: Normal respiratory effort, no increased work of breathing. Skin: No rashes, bruises or suspicious  lesions. Neurologic: Grossly intact, no focal deficits, moving all 4 extremities.     Assessment & Plan:    1. BPH with obstruction/lower urinary tract symptoms Stable on doxazosin/dutasteride which were refilled. PVR by bladder scan today was 0 mL.  Continue annual follow-up.   Abbie Sons, [place] [address], [place], [place] [address] [phone]

## 2019-12-14 ENCOUNTER — Telehealth: Payer: Self-pay | Admitting: Urology

## 2019-12-14 NOTE — Telephone Encounter (Signed)
Pt wife returning missed call. [facility], pt gets [facility]. Doxazosin, pt does not need any at the moment, [facility], wants pharmacy fixed in chart, will call when a refill is needed. They still have 2 refills from [facility].

## 2019-12-29 ENCOUNTER — Encounter: Payer: Self-pay | Admitting: Family Medicine

## 2019-12-29 ENCOUNTER — Ambulatory Visit: Payer: Medicare HMO | Admitting: Family Medicine

## 2019-12-29 ENCOUNTER — Other Ambulatory Visit: Payer: Self-pay

## 2019-12-29 VITALS — BP 118/76 | HR 78 | Temp 97.9°F | Resp 16 | Ht 67.0 in | Wt 173.3 lb

## 2019-12-29 DIAGNOSIS — F323 Major depressive disorder, single episode, severe with psychotic features: Secondary | ICD-10-CM

## 2019-12-29 DIAGNOSIS — F01518 Vascular dementia, unspecified severity, with other behavioral disturbance: Secondary | ICD-10-CM

## 2019-12-29 DIAGNOSIS — D649 Anemia, unspecified: Secondary | ICD-10-CM

## 2019-12-29 DIAGNOSIS — J432 Centrilobular emphysema: Secondary | ICD-10-CM | POA: Diagnosis not present

## 2019-12-29 DIAGNOSIS — F0151 Vascular dementia with behavioral disturbance: Secondary | ICD-10-CM | POA: Diagnosis not present

## 2019-12-29 DIAGNOSIS — E785 Hyperlipidemia, unspecified: Secondary | ICD-10-CM | POA: Diagnosis not present

## 2019-12-29 DIAGNOSIS — N400 Enlarged prostate without lower urinary tract symptoms: Secondary | ICD-10-CM

## 2019-12-29 DIAGNOSIS — I10 Essential (primary) hypertension: Secondary | ICD-10-CM

## 2019-12-29 MED ORDER — PRAVASTATIN SODIUM 40 MG PO TABS: 40.0000 mg | ORAL_TABLET | Freq: Every day | ORAL | 1 refills | Status: DC

## 2019-12-29 MED ORDER — CITALOPRAM HYDROBROMIDE 20 MG PO TABS: 20.0000 mg | ORAL_TABLET | Freq: Every day | ORAL | 1 refills | Status: DC

## 2019-12-29 NOTE — Patient Instructions (Signed)

## 2019-12-29 NOTE — Progress Notes (Signed)
Name: Eugene Murphy   MRN: [id number]    DOB: 05-21-1932   Date:12/29/2019       Progress Note  Subjective  Chief Complaint  Chief Complaint  Patient presents with  . Medication Refill  . Hypertension  . Dementia  with behavior changes.major depression with psycho  . Benign Prostatic Hypertrophy  . Hyperlipidemia  . Sleep Apnea  . Anemia    HPI  Dementiawith behavior changes.major depression with psychotic features: his healthwent down around Summer 2019he started goingto multiple doctors. Had abrupt onset of weight lossHe initially hadnausea, weight loss, fatigue. Wife noticed a change in behaviorfeeling down, unable to sleep,pacing all the time, and auditory hallucinations.  We started him on Citalopram andDr. Manuella Ghazi added Seroquel and his appetite and behavior has improved, weight is also stable. . Currently not having any auditory hallucinations, he used to wake up in the middle of the night agitated but has been doing well for over 6 months   BPH: we decreased dose of Cardura to half pill because of hypotension but wife states over the past two weeks he has been having worsening of nocturia during the night only, so she decided to go back to 8 mg daily.He denies dizziness.BP during visit with Dr Bernardo Heater was elevated, but bp at neurologist was normal.   HTN: bp is at goal, he is off medication, lost 32 lbs in the past year,  no chest pain or palpitation, he denies dizziness.  Malnutrition: he lost another 21 lbs in the past year, wife states he has a good appetite, good portion size, she states weight has been in the 179 lbs -180 lbs but today is a little lower again. Advised to try increase in protein intake. He has lost 75 lbs in the past 2 years . He had a lot of tests done and unsure of the cause of weight loss. He does not want to do anything else at this time  Hyperlipidemia: taking medication and denies side effects, no chest pain or palpitation  Reviewed last labs , LDL is at goal at 54   Anemia: under the care of Dr. Mike Gip , he has gone back, she advised a dietician evaluation but he is not interested.   OSA: he has lost 75  lbs in the past 2  Years and is no longer snoring and Dr. Raul Del has discontinued his CPAP machine   BPH: under the care of Dr. Bernardo Heater and symptoms are okay with medication Unchanged   Asthma mild intermittent/COPD: seeing Dr. 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 Secondary malignant neoplasm of liver and intrahepatic bile duct ([place]) 11/17/2018  . Dementia with behavioral disturbance ([place]) 09/12/2018  . Weight loss 07/06/2018  . Abnormal computed tomography of large intestine   . Benign neoplasm of ascending colon   . Polyp of sigmoid colon   . Colon wall thickening 06/17/2018  . Lesion of liver 06/17/2018  . OSA on CPAP 12/31/2017  . GERD (gastroesophageal reflux disease) 07/25/2016  . Environmental and seasonal allergies 02/29/2016  . Elevated PSA 11/02/2015  . Hyperlipidemia 04/13/2015  . Hypertension 04/06/2015  . BPH with obstruction/lower urinary tract symptoms 04/06/2015  . Hypersomnia with sleep apnea 12/21/2013  . Asthma, mild intermittent 12/21/2013  . Chronic obstructive airway disease with asthma ([place]) 12/21/2013    Past Surgical  History:  Procedure Laterality Date  . COLONOSCOPY WITH PROPOFOL N/A 06/19/2018   Procedure: COLONOSCOPY WITH PROPOFOL WITH BIOPSIES;  Surgeon: [name], MD;  Location: [facility];  Service: Endoscopy;  Laterality: N/A;  sleep apnea  . COSMETIC SURGERY Left    Left Arm Plastic Surgery in 1970s    Family History  Problem Relation Age of Onset  . Congestive Heart Failure Father   . Healthy Sister   . Dementia Brother     Social History   Tobacco Use  . Smoking status: Never Smoker  .  Smokeless tobacco: Never Used  Substance Use Topics  . Alcohol use: Yes    Alcohol/week: 0.0 standard drinks    Comment: Occasional     Current Outpatient Medications:  .  Albuterol Sulfate 108 (90 Base) MCG/ACT AEPB, Inhale 2 puffs into the lungs as needed. , Disp: , Rfl:  .  Ascorbic Acid (VITAMIN C) 1000 MG tablet, Take 500 mg by mouth daily. , Disp: , Rfl:  .  ASMANEX 30 METERED DOSES 220 MCG/INH inhaler, Inhale 2 puffs into the lungs as needed. , Disp: , Rfl:  .  azelastine (ASTELIN) 0.1 % nasal spray, Place 1 spray into both nostrils as needed. , Disp: , Rfl:  .  Cholecalciferol (VITAMIN D-3) 5000 units TABS, Take 1 tablet by mouth daily. , Disp: , Rfl:  .  citalopram (CELEXA) 20 MG tablet, Take 1 tablet (20 mg total) by mouth daily., Disp: 90 tablet, Rfl: 1 .  doxazosin (CARDURA) 8 MG tablet, Take 1 tablet (8 mg total) by mouth daily., Disp: 90 tablet, Rfl: 3 .  dutasteride (AVODART) 0.5 MG capsule, Take 1 capsule (0.5 mg total) by mouth daily., Disp: 90 capsule, Rfl: 3 .  fluticasone (FLONASE) 50 MCG/ACT nasal spray, , Disp: , Rfl:  .  lansoprazole (PREVACID) 30 MG capsule, Take 1 capsule (30 mg total) by mouth daily., Disp: 90 capsule, Rfl: 1 .  latanoprost (XALATAN) 0.005 % ophthalmic solution, Place 1 drop into both eyes at bedtime. , Disp: , Rfl:  .  levocetirizine (XYZAL) 5 MG tablet, Take 1 tablet (5 mg total) by mouth every evening., Disp: 90 tablet, Rfl: 1 .  mirtazapine (REMERON) 15 MG tablet, Take 1 tablet (15 mg total) by mouth at bedtime., Disp: 90 tablet, Rfl: 1 .  Potassium 99 MG TABS, Take 1 tablet by mouth daily at 8 pm., Disp: , Rfl:  .  pravastatin (PRAVACHOL) 40 MG tablet, Take 1 tablet (40 mg total) by mouth daily., Disp: 90 tablet, Rfl: 1 .  QUEtiapine (SEROQUEL) 25 MG tablet, Take 1 tablet (25 mg total) by mouth 2 (two) times daily., Disp: 90 tablet, Rfl: 1 .  donepezil (ARICEPT) 5 MG tablet, Take by mouth., Disp: , Rfl:   Allergies  Allergen Reactions  .  Shellfish Allergy Swelling    Throat swells    I personally reviewed active problem list, medication list, allergies, family history, social history, health maintenance with the patient/caregiver today.   ROS  Constitutional: Negative for fever , positive for weight change.  Respiratory: Negative for cough and shortness of breath.   Cardiovascular: Negative for chest pain or palpitations.  Gastrointestinal: Negative for abdominal pain, no bowel changes.  Musculoskeletal: Negative for gait problem or joint swelling.  Skin: Negative for rash.  Neurological: Negative for dizziness or headache.  No other specific complaints in a complete review of systems (except as listed in HPI above).  Objective  Vitals:   12/29/19 [id number]  BP: 118/76  Pulse: 78  Resp: 16  Temp: 97.9 F (36.6 C)  TempSrc: Temporal  SpO2: 94%  Weight: 173 lb 4.8 oz (78.6 kg)  Height: 5\' 7"  (1.702 m)    Body mass index is 27.14 kg/m.  Physical Exam  Constitutional: Patient appears well-developed but temporal waisting noticed  No distress.  HEENT: head atraumatic, normocephalic, pupils equal and reactive to light Cardiovascular: Normal rate, regular rhythm and normal heart sounds.  No murmur heard. No BLE edema. Pulmonary/Chest: Effort normal and breath sounds normal. No respiratory distress. Abdominal: Soft.  There is no tenderness. Psychiatric: Patient has a normal mood and affect. behavior is normal. Judgment and thought content normal.  Recent Results (from the past 2160 hour(s))  Bladder Scan (Post Void Residual) in office     Status: None   Collection Time: 12/11/19  1:08 PM  Result Value Ref Range   Scan Result 0      PHQ2/9: Depression screen [facility] 2/9 12/29/2019 06/30/2019 09/17/2018 07/09/2018 06/17/2018  Decreased Interest 0 0 0 3 0  Down, Depressed, Hopeless 0 0 2 3 0  PHQ - 2 Score 0 0 2 6 0  Altered sleeping 0 0 0 3 0  Tired, decreased energy 0 0 1 3 0  Change in appetite 0 0 0 3 3   Feeling bad or failure about yourself  0 0 0 3 0  Trouble concentrating 0 0 0 3 0  Moving slowly or fidgety/restless 0 0 0 3 0  Suicidal thoughts 0 0 0 0 0  PHQ-9 Score 0 0 3 24 3   Difficult doing work/chores Not difficult at all Not difficult at all Not difficult at all Extremely dIfficult Not difficult at all    phq 9 is negative   Fall Risk: Fall Risk  12/29/2019 06/30/2019 11/17/2018 09/17/2018 07/09/2018  Falls in the past year? 0 0 0 0 No  Number falls in past yr: 0 0 0 - -  Injury with Fall? 0 0 0 - -     Functional Status Survey: Is the patient deaf or have difficulty hearing?: No Does the patient have difficulty seeing, even when wearing glasses/contacts?: Yes Does the patient have difficulty concentrating, remembering, or making decisions?: No Does the patient have difficulty walking or climbing stairs?: No Does the patient have difficulty dressing or bathing?: No Does the patient have difficulty doing errands alone such as visiting a doctor's office or shopping?: Yes(Wife drives)    Assessment & Plan   1. Dyslipidemia  - pravastatin (PRAVACHOL) 40 MG tablet; Take 1 tablet (40 mg total) by mouth daily.  Dispense: 90 tablet; Refill: 1  2. Severe major depression with psychotic features ([facility])  - citalopram (CELEXA) 20 MG tablet; Take 1 tablet (20 mg total) by mouth daily.  Dispense: 90 tablet; Refill: 1  3. Vascular dementia with behavior disturbance ([place])  On statin therapy   4. 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 Essential hypertension  On cardura only   6. BPH without obstruction/lower urinary tract symptoms  Continue on medication   7. Anemia, unspecified type  Sees Dr. Mike Gip

## 2020-01-07 ENCOUNTER — Other Ambulatory Visit: Payer: Self-pay | Admitting: Family Medicine

## 2020-01-07 DIAGNOSIS — K219 Gastro-esophageal reflux disease without esophagitis: Secondary | ICD-10-CM

## 2020-01-07 MED ORDER — LANSOPRAZOLE 30 MG PO CPDR: 30.0000 mg | DELAYED_RELEASE_CAPSULE | Freq: Every day | ORAL | 1 refills | Status: DC

## 2020-01-07 NOTE — Telephone Encounter (Signed)
Requested medication (s) are due for refill today: yes  Requested medication (s) are on the active medication list: yes  Last refill: 06/30/2019  Future visit scheduled: yes  Notes to clinic:  insurance requests new diagnosis    Requested Prescriptions  Pending Prescriptions Disp Refills   lansoprazole (PREVACID) 30 MG capsule 90 capsule 1    Sig: Take 1 capsule (30 mg total) by mouth daily.      Gastroenterology: Proton Pump Inhibitors Passed - 01/07/2020  9:23 AM      Passed - Valid encounter within last 12 months    Recent Outpatient Visits           1 week ago Vascular dementia with behavior disturbance [facility])   [facility] Steele Sizer, MD   6 months ago Vascular dementia with behavior disturbance [facility])   [facility] Steele Sizer, MD   1 year ago Vascular dementia with behavior disturbance [facility])   [facility] Steele Sizer, MD   1 year ago Vascular dementia with behavior disturbance [facility])   [facility] Steele Sizer, MD   1 year ago Memory changes   [facility] Steele Sizer, MD       Future Appointments             In 1 month  [facility], [place]   In 5 months Steele Sizer, MD [facility], [place]   In 11 months [place], Ronda Fairly, [facility]

## 2020-01-07 NOTE — Telephone Encounter (Signed)
Medication Refill - Medication: prevacid   Has the patient contacted their pharmacy? Yes.   (Agent: If no, request that the patient contact the pharmacy for the refill.) (Agent: If yes, when and what did the pharmacy advise?)  Preferred Pharmacy (with phone number or street name):  [place], [place]  [place] [address]  Phone: [phone] Fax: [phone]  Not a 24 hour pharmacy; exact hours not known.     Agent: Please be advised that RX refills may take up to 3 business days. We ask that you follow-up with your pharmacy.

## 2020-01-27 ENCOUNTER — Other Ambulatory Visit: Payer: Self-pay | Admitting: *Deleted

## 2020-01-27 ENCOUNTER — Telehealth: Payer: Self-pay | Admitting: Urology

## 2020-01-27 DIAGNOSIS — N401 Enlarged prostate with lower urinary tract symptoms: Secondary | ICD-10-CM

## 2020-01-27 DIAGNOSIS — N138 Other obstructive and reflux uropathy: Secondary | ICD-10-CM

## 2020-01-27 MED ORDER — DOXAZOSIN MESYLATE 8 MG PO TABS: 8.0000 mg | ORAL_TABLET | Freq: Every day | ORAL | 3 refills | Status: DC

## 2020-01-27 NOTE — Telephone Encounter (Signed)
[facility]

## 2020-01-27 NOTE — Telephone Encounter (Signed)
Eugene Murphy called stating she [facility] She's had a hard time getting the [facility], [facility]. [facility] has been cx[facility] by pt wife, she would like [facility], pt has enough for another week. Please advise. Thanks.

## 2020-02-16 ENCOUNTER — Ambulatory Visit (INDEPENDENT_AMBULATORY_CARE_PROVIDER_SITE_OTHER): Payer: Medicare HMO

## 2020-02-16 ENCOUNTER — Other Ambulatory Visit: Payer: Self-pay

## 2020-02-16 VITALS — BP 134/72 | HR 87 | Temp 97.1°F | Resp 16 | Ht 67.0 in | Wt 176.4 lb

## 2020-02-16 DIAGNOSIS — Z0000 Encounter for general adult medical examination without abnormal findings: Secondary | ICD-10-CM | POA: Diagnosis not present

## 2020-02-16 NOTE — Patient Instructions (Signed)
Eugene Murphy , Thank you for taking time to come for your Medicare Wellness Visit. I appreciate your ongoing commitment to your health goals. Please review the following plan we discussed and let me know if I can assist you in the future.   Screening recommendations/referrals: Colonoscopy: done 06/19/18 Recommended yearly ophthalmology/optometry visit for glaucoma screening and checkup Recommended yearly dental visit for hygiene and checkup  Vaccinations: Influenza vaccine: done 06/30/19 Pneumococcal vaccine: done 12/01/13 Tdap vaccine: done 06/05/11 Shingles vaccine: Shingrix discussed. Please contact your pharmacy for coverage information.  Covid-19: done 11/25/19 & 12/16/19  Advanced directives: Advance directive discussed with you today. Even though you declined this today please call our office should you change your mind and we can give you the proper paperwork for you to fill out.  Conditions/risks identified: Recommend increasing physical activity  Next appointment: Please follow up in one year for your Medicare Annual Wellness visit.    Preventive Care 14 Years and Older, Male Preventive care refers to lifestyle choices and visits with your health care provider that can promote health and wellness. What does preventive care include?  A yearly physical exam. This is also called an annual well check.  Dental exams once or twice a year.  Routine eye exams. Ask your health care provider how often you should have your eyes checked.  Personal lifestyle choices, including:  Daily care of your teeth and gums.  Regular physical activity.  Eating a healthy diet.  Avoiding tobacco and drug use.  Limiting alcohol use.  Practicing safe sex.  Taking low doses of aspirin every day.  Taking vitamin and mineral supplements as recommended by your health care provider. What happens during an annual well check? The services and screenings done by your health care provider during your  annual well check will depend on your age, overall health, lifestyle risk factors, and family history of disease. Counseling  Your health care provider may ask you questions about your:  Alcohol use.  Tobacco use.  Drug use.  Emotional well-being.  Home and relationship well-being.  Sexual activity.  Eating habits.  History of falls.  Memory and ability to understand (cognition).  Work and work Statistician. Screening  You may have the following tests or measurements:  Height, weight, and BMI.  Blood pressure.  Lipid and cholesterol levels. These may be checked every 5 years, or more frequently if you are over 72 years old.  Skin check.  Lung cancer screening. You may have this screening every year starting at age 17 if you have a 30-pack-year history of smoking and currently smoke or have quit within the past 15 years.  Fecal occult blood test (FOBT) of the stool. You may have this test every year starting at age 68.  Flexible sigmoidoscopy or colonoscopy. You may have a sigmoidoscopy every 5 years or a colonoscopy every 10 years starting at age 58.  Prostate cancer screening. Recommendations will vary depending on your family history and other risks.  Hepatitis C blood test.  Hepatitis B blood test.  Sexually transmitted disease (STD) testing.  Diabetes screening. This is done by checking your blood sugar (glucose) after you have not eaten for a while (fasting). You may have this done every 1-3 years.  Abdominal aortic aneurysm (AAA) screening. You may need this if you are a current or former smoker.  Osteoporosis. You may be screened starting at age 58 if you are at high risk. Talk with your health care provider about your test results, treatment  options, and if necessary, the need for more tests. Vaccines  Your health care provider may recommend certain vaccines, such as:  Influenza vaccine. This is recommended every year.  Tetanus, diphtheria, and  acellular pertussis (Tdap, Td) vaccine. You may need a Td booster every 10 years.  Zoster vaccine. You may need this after age 35.  Pneumococcal 13-valent conjugate (PCV13) vaccine. One dose is recommended after age 64.  Pneumococcal polysaccharide (PPSV23) vaccine. One dose is recommended after age 9. Talk to your health care provider about which screenings and vaccines you need and how often you need them. This information is not intended to replace advice given to you by your health care provider. Make sure you discuss any questions you have with your health care provider. Document Released: 10/21/2015 Document Revised: 06/13/2016 Document Reviewed: 07/26/2015 Elsevier Interactive Patient Education  2017 [place] Prevention in the Home Falls can cause injuries. They can happen to people of all ages. There are many things you can do to make your home safe and to help prevent falls. What can I do on the outside of my home?  Regularly fix the edges of walkways and driveways and fix any cracks.  Remove anything that might make you trip as you walk through a door, such as a raised step or threshold.  Trim any bushes or trees on the path to your home.  Use bright outdoor lighting.  Clear any walking paths of anything that might make someone trip, such as rocks or tools.  Regularly check to see if handrails are loose or broken. Make sure that both sides of any steps have handrails.  Any raised decks and porches should have guardrails on the edges.  Have any leaves, snow, or ice cleared regularly.  Use sand or salt on walking paths during winter.  Clean up any spills in your garage right away. This includes oil or grease spills. What can I do in the bathroom?  Use night lights.  Install grab bars by the toilet and in the tub and shower. Do not use towel bars as grab bars.  Use non-skid mats or decals in the tub or shower.  If you need to sit down in the shower, use  a plastic, non-slip stool.  Keep the floor dry. Clean up any water that spills on the floor as soon as it happens.  Remove soap buildup in the tub or shower regularly.  Attach bath mats securely with double-sided non-slip rug tape.  Do not have throw rugs and other things on the floor that can make you trip. What can I do in the bedroom?  Use night lights.  Make sure that you have a light by your bed that is easy to reach.  Do not use any sheets or blankets that are too big for your bed. They should not hang down onto the floor.  Have a firm chair that has side arms. You can use this for support while you get dressed.  Do not have throw rugs and other things on the floor that can make you trip. What can I do in the kitchen?  Clean up any spills right away.  Avoid walking on wet floors.  Keep items that you use a lot in easy-to-reach places.  If you need to reach something above you, use a strong step stool that has a grab bar.  Keep electrical cords out of the way.  Do not use floor polish or wax that makes floors slippery.  If you must use wax, use non-skid floor wax.  Do not have throw rugs and other things on the floor that can make you trip. What can I do with my stairs?  Do not leave any items on the stairs.  Make sure that there are handrails on both sides of the stairs and use them. Fix handrails that are broken or loose. Make sure that handrails are as long as the stairways.  Check any carpeting to make sure that it is firmly attached to the stairs. Fix any carpet that is loose or worn.  Avoid having throw rugs at the top or bottom of the stairs. If you do have throw rugs, attach them to the floor with carpet tape.  Make sure that you have a light switch at the top of the stairs and the bottom of the stairs. If you do not have them, ask someone to add them for you. What else can I do to help prevent falls?  Wear shoes that:  Do not have high heels.  Have  rubber bottoms.  Are comfortable and fit you well.  Are closed at the toe. Do not wear sandals.  If you use a stepladder:  Make sure that it is fully opened. Do not climb a closed stepladder.  Make sure that both sides of the stepladder are locked into place.  Ask someone to hold it for you, if possible.  Clearly mark and make sure that you can see:  Any grab bars or handrails.  First and last steps.  Where the edge of each step is.  Use tools that help you move around (mobility aids) if they are needed. These include:  Canes.  Walkers.  Scooters.  Crutches.  Turn on the lights when you go into a dark area. Replace any light bulbs as soon as they burn out.  Set up your furniture so you have a clear path. Avoid moving your furniture around.  If any of your floors are uneven, fix them.  If there are any pets around you, be aware of where they are.  Review your medicines with your doctor. Some medicines can make you feel dizzy. This can increase your chance of falling. Ask your doctor what other things that you can do to help prevent falls. This information is not intended to replace advice given to you by your health care provider. Make sure you discuss any questions you have with your health care provider. Document Released: 07/21/2009 Document Revised: 03/01/2016 Document Reviewed: 10/29/2014 Elsevier Interactive Patient Education  2017 [employer].

## 2020-02-16 NOTE — Progress Notes (Signed)
Subjective:   Eugene Murphy is a 86 y.o. male who presents for Medicare Annual/Subsequent preventive examination.  Review of Systems:   Cardiac Risk Factors include: advanced age (>81men, >34 women);dyslipidemia;male gender;hypertension     Objective:    Vitals: BP 134/72 (BP Location: Right Arm, Patient Position: Sitting, Cuff Size: Normal)   Pulse 87   Temp (!) 97.1 F (36.2 C) (Temporal)   Resp 16   Ht 5\' 7"  (1.702 m)   Wt 176 lb 6.4 oz (80 kg)   SpO2 96%   BMI 27.63 kg/m   Body mass index is 27.63 kg/m.  Advanced Directives 02/16/2020 07/07/2018 06/23/2018 06/19/2018 06/17/2018 06/15/2018 06/19/2017  Does Patient Have a Medical Advance Directive? No Yes No Yes No No No  Type of Advance Directive - - Public librarian;Living will - - -  Does patient want to make changes to medical advance directive? - - - - - - -  Copy of [place] in Chart? - - - Yes - - -  Would patient like information on creating a medical advance directive? No - Patient declined - No - Patient declined - - No - Patient declined -    Tobacco Social History   Tobacco Use  Smoking Status Never Smoker  Smokeless Tobacco Never Used     Counseling given: Not Answered   Clinical Intake:  Pre-visit preparation completed: Yes  Pain : No/denies pain     BMI - recorded: 27.63 Nutritional Status: BMI 25 -29 Overweight Nutritional Risks: None Diabetes: No  How often do you need to have someone help you when you read instructions, pamphlets, or other written materials from your doctor or pharmacy?: 3 - Sometimes  Interpreter Needed?: No  Information entered by :: Clemetine Marker LPN  Past Medical History:  Diagnosis Date  . Arthritis    lower back  . Asthma   . BPH (benign prostatic hyperplasia)    Followed by Bonna Gains.  Marland Kitchen GERD (gastroesophageal reflux disease)   . Glaucoma   . Hyperlipidemia   . Hypertension   . Sleep apnea    CPAP   Past Surgical History:    Procedure Laterality Date  . COLONOSCOPY WITH PROPOFOL N/A 06/19/2018   Procedure: COLONOSCOPY WITH PROPOFOL WITH BIOPSIES;  Surgeon: [name], MD;  Location: [place];  Service: Endoscopy;  Laterality: N/A;  sleep apnea  . COSMETIC SURGERY Left    Left Arm Plastic Surgery in 1970s   Family History  Problem Relation Age of Onset  . Congestive Heart Failure Father   . Healthy Sister   . Dementia Brother    Social History   Socioeconomic History  . Marital status: Married    Spouse name: Flora  . Number of children: 3  . Years of education: Not on file  . Highest education level: 9th grade  Occupational History  . Occupation: cab drivers    Comment: retired many years ago   Tobacco Use  . Smoking status: Never Smoker  . Smokeless tobacco: Never Used  Substance and Sexual Activity  . Alcohol use: Yes    Alcohol/week: 0.0 standard drinks    Comment: Occasional  . Drug use: No  . Sexual activity: Yes    Partners: Female  Other Topics Concern  . Not on file  Social History Narrative   He had 3 children prior to getting married, one died in a motorcycle accident    He has two grown children in  [place] and one in [place] here from [place] after retirement    Social Determinants of Health   Financial Resource Strain: Low Risk   . Difficulty of Paying Living Expenses: Not hard at all  Food Insecurity: No Food Insecurity  . Worried About Charity fundraiser in the Last Year: Never true  . Ran Out of Food in the Last Year: Never true  Transportation Needs: No Transportation Needs  . Lack of Transportation (Medical): No  . Lack of Transportation (Non-Medical): No  Physical Activity: Inactive  . Days of Exercise per Week: 0 days  . Minutes of Exercise per Session: 0 min  Stress: Stress Concern Present  . Feeling of Stress : To some extent  Social Connections: Unknown  . Frequency of Communication with Friends and Family: Once a week  . Frequency of  Social Gatherings with Friends and Family: Not on file  . Attends Religious Services: More than 4 times per year  . Active Member of Clubs or Organizations: No  . Attends Archivist Meetings: Never  . Marital Status: Married    Outpatient Encounter Medications as of 02/16/2020  Medication Sig  . Albuterol Sulfate 108 (90 Base) MCG/ACT AEPB Inhale 2 puffs into the lungs as needed.   . Ascorbic Acid (VITAMIN C) 1000 MG tablet Take 500 mg by mouth daily.   Darlin Priestly 30 METERED DOSES 220 MCG/INH inhaler Inhale 2 puffs into the lungs as needed.   Marland Kitchen azelastine (ASTELIN) 0.1 % nasal spray Place 1 spray into both nostrils as needed.   . Cholecalciferol (VITAMIN D-3) 5000 units TABS Take 1 tablet by mouth daily.   . citalopram (CELEXA) 20 MG tablet Take 1 tablet (20 mg total) by mouth daily.  Marland Kitchen doxazosin (CARDURA) 8 MG tablet Take 1 tablet (8 mg total) by mouth daily.  Marland Kitchen dutasteride (AVODART) 0.5 MG capsule Take 1 capsule (0.5 mg total) by mouth daily.  . fluticasone (FLONASE) 50 MCG/ACT nasal spray   . lansoprazole (PREVACID) 30 MG capsule Take 1 capsule (30 mg total) by mouth daily.  Marland Kitchen latanoprost (XALATAN) 0.005 % ophthalmic solution Place 1 drop into both eyes at bedtime.   Marland Kitchen levocetirizine (XYZAL) 5 MG tablet Take 1 tablet (5 mg total) by mouth every evening.  . mirtazapine (REMERON) 15 MG tablet Take 1 tablet (15 mg total) by mouth at bedtime.  . Potassium 99 MG TABS Take 1 tablet by mouth daily at 8 pm.  . pravastatin (PRAVACHOL) 40 MG tablet Take 1 tablet (40 mg total) by mouth daily.  . QUEtiapine (SEROQUEL) 25 MG tablet Take 1 tablet (25 mg total) by mouth 2 (two) times daily.  Marland Kitchen donepezil (ARICEPT) 5 MG tablet Take by mouth.   No facility-administered encounter medications on file as of 02/16/2020.    Activities of Daily Living In your present state of health, do you have any difficulty performing the following activities: 02/16/2020 12/29/2019  Hearing? N N  Comment declines  hearing aids -  Vision? Y Y  Difficulty concentrating or making decisions? N N  Walking or climbing stairs? N N  Dressing or bathing? N N  Doing errands, shopping? Y Y  Comment - Wife Restaurant manager, fast food and eating ? N -  Using the Toilet? N -  In the past six months, have you accidently leaked urine? N -  Do you have problems with loss of bowel control? N -  Managing your Medications? Y -  Managing  your Finances? Y -  Housekeeping or managing your Housekeeping? N -  Some recent data might be hidden    Patient Care Team: Steele Sizer, MD as PCP - General (Family Medicine) Erby Pian, MD as Referring Physician (Specialist) Vin-Parikh, Deirdre Peer, MD as Referring Physician (Ophthalmology) Ubaldo Glassing Javier Docker, MD as Consulting Physician (Cardiology) Abbie Sons, MD as Consulting Physician (Urology) Leanor Kail, MD (Inactive) as Consulting Physician (Orthopedic Surgery) Clent Jacks, RN as Registered Nurse Vladimir Crofts, MD as Consulting Physician (Neurology)   Assessment:   This is a routine wellness examination for [place].  Exercise Activities and Dietary recommendations Current Exercise Habits: The patient does not participate in regular exercise at present, Exercise limited by: orthopedic condition(s);neurologic condition(s)  Goals    . Exercise     Pt to start back exercising/walking for 30 minutes 2-3 days a week.       Fall Risk Fall Risk  02/16/2020 12/29/2019 06/30/2019 11/17/2018 09/17/2018  Falls in the past year? 0 0 0 0 0  Number falls in past yr: 0 0 0 0 -  Injury with Fall? 0 0 0 0 -  Risk for fall due to : Impaired balance/gait - - - -  Follow up Falls prevention discussed - - - -   FALL RISK PREVENTION PERTAINING TO THE HOME:  Any stairs in or around the home? Yes  If so, do they handrails? Yes   Home free of loose throw rugs in walkways, pet beds, electrical cords, etc? Yes  Adequate lighting in your home to reduce risk of  falls? Yes   ASSISTIVE DEVICES UTILIZED TO PREVENT FALLS:  Life alert? No  Use of a cane, walker or w/c? Yes  Grab bars in the bathroom? Yes  Shower chair or bench in shower? No  Elevated toilet seat or a handicapped toilet? Yes   DME ORDERS:  DME order needed?  No   TIMED UP AND GO:  Was the test performed? Yes .  Length of time to ambulate 10 feet: 7 sec.   GAIT:  Appearance of gait: Gait stead-fast and with the use of an assistive device.   Education: Fall risk prevention has been discussed.  Intervention(s) required? No    Depression Screen PHQ 2/9 Scores 02/16/2020 12/29/2019 06/30/2019 09/17/2018  PHQ - 2 Score 0 0 0 2  PHQ- 9 Score - 0 0 3    Cognitive Function 6CIT deferred, managed by neurology     6CIT Screen 07/09/2018 12/17/2016  What Year? 4 points 0 points  What month? 3 points 0 points  What time? 0 points 0 points  Count back from 20 0 points 0 points  Months in reverse 4 points 4 points  Repeat phrase 10 points 2 points  Total Score 21 6    Immunization History  Administered Date(s) Administered  . Fluad Quad(high Dose 65+) 06/30/2019  . Influenza, High Dose Seasonal PF 06/14/2015, 06/15/2016, 06/19/2017, 06/17/2018  . Influenza-Unspecified 06/19/2017  . PFIZER SARS-COV-2 Vaccination 11/25/2019, 12/16/2019  . Pneumococcal Conjugate-13 12/01/2013  . Tdap 06/05/2011    Qualifies for Shingles Vaccine? Yes  . Due for Shingrix. Education has been provided regarding the importance of this vaccine. Pt has been advised to call insurance company to determine out of pocket expense. Advised may also receive vaccine at local pharmacy or Health Dept. Verbalized acceptance and understanding.  Tdap: Up to date  Flu Vaccine: Up to date  Pneumococcal Vaccine: Up to date  Covid-19 Vaccine: Up  to date   Screening Tests Health Maintenance  Topic Date Due  . INFLUENZA VACCINE  05/08/2020  . TETANUS/TDAP  06/04/2021  . COVID-19 Vaccine  Completed  . PNA  vac Low Risk Adult  Completed   Cancer Screenings:  Colorectal Screening: Completed 06/19/18. No longer required.   Lung Cancer Screening: (Low Dose CT Chest recommended if Age 77-80 years, 30 pack-year currently smoking OR have quit w/in 15years.) does not qualify.    Additional Screening:  Hepatitis C Screening: no longer required  Vision Screening: Recommended annual ophthalmology exams for early detection of glaucoma and other disorders of the eye. Is the patient up to date with their annual eye exam?  Yes  Who is the provider or what is the name of the office in which the pt attends annual eye exams? [place] Screening: Recommended annual dental exams for proper oral hygiene  Community Resource Referral:  CRR required this visit?  No        Plan:    I have personally reviewed and addressed the Medicare Annual Wellness questionnaire and have noted the following in the patient's chart:  A. Medical and social history B. Use of alcohol, tobacco or illicit drugs  C. Current medications and supplements D. Functional ability and status E.  Nutritional status F.  Physical activity G. Advance directives H. List of other physicians I.  Hospitalizations, surgeries, and ER visits in previous 12 months J.  [place] such as hearing and vision if needed, cognitive and depression L. Referrals and appointments   In addition, I have reviewed and discussed with patient certain preventive protocols, quality metrics, and best practice recommendations. A written personalized care plan for preventive services as well as general preventive health recommendations were provided to patient.   Signed,  Clemetine Marker, LPN Nurse Health Advisor   Nurse Notes: none

## 2020-06-15 ENCOUNTER — Other Ambulatory Visit: Payer: Self-pay | Admitting: Family Medicine

## 2020-06-15 DIAGNOSIS — K219 Gastro-esophageal reflux disease without esophagitis: Secondary | ICD-10-CM

## 2020-06-15 DIAGNOSIS — F323 Major depressive disorder, single episode, severe with psychotic features: Secondary | ICD-10-CM

## 2020-06-15 DIAGNOSIS — E785 Hyperlipidemia, unspecified: Secondary | ICD-10-CM

## 2020-06-15 MED ORDER — PRAVASTATIN SODIUM 40 MG PO TABS: 40.0000 mg | ORAL_TABLET | Freq: Every day | ORAL | 0 refills | Status: DC

## 2020-06-15 MED ORDER — LANSOPRAZOLE 30 MG PO CPDR: 30.0000 mg | DELAYED_RELEASE_CAPSULE | Freq: Every day | ORAL | 0 refills | Status: DC

## 2020-06-15 MED ORDER — CITALOPRAM HYDROBROMIDE 20 MG PO TABS: 20.0000 mg | ORAL_TABLET | Freq: Every day | ORAL | 0 refills | Status: DC

## 2020-06-15 NOTE — Telephone Encounter (Signed)
PT need a refill citalopram (CELEXA) 20 MG tablet [[id number]]  pravastatin (PRAVACHOL) 40 MG tablet [[id number]] lansoprazole (PREVACID) 30 MG capsule [[id number] [facility] - [place]ia Buff, [place] - [address]  [address] [place] [address] [place] [address]  Phone:  [phone] Fax:  [phone]

## 2020-06-15 NOTE — Telephone Encounter (Signed)
Requested Prescriptions  Pending Prescriptions Disp Refills   citalopram (CELEXA) 20 MG tablet 90 tablet 0    Sig: Take 1 tablet (20 mg total) by mouth daily.     Psychiatry:  Antidepressants - SSRI Passed - 06/15/2020 10:12 AM      Passed - Valid encounter within last 6 months    Recent Outpatient Visits          5 months ago Vascular dementia with behavior disturbance [facility])   [facility] Steele Sizer, MD   11 months ago Vascular dementia with behavior disturbance [facility])   [facility] Sizer, MD   1 year ago Vascular dementia with behavior disturbance [facility])   [facility] Sizer, MD   1 year ago Vascular dementia with behavior disturbance [facility])   [facility]izer, MD   1 year ago Memory changes   [facility] Steele Sizer, MD      Future Appointments            In 2 weeks Steele Sizer, MD [facility] [place]   In 6 months Stoioff, Ronda Fairly, MD [place]   In 8 months  [facility]            pravastatin (PRAVACHOL) 40 MG tablet 90 tablet 0    Sig: Take 1 tablet (40 mg total) by mouth daily.     Cardiovascular:  Antilipid - Statins Passed - 06/15/2020 10:12 AM      Passed - Total Cholesterol in normal range and within 360 days    Cholesterol, Total  Date Value Ref Range Status  10/20/2015 123 100 - 199 mg/dL Final   Cholesterol  Date Value Ref Range Status  06/30/2019 125 <200 mg/dL Final         Passed - LDL in normal range and within 360 days    LDL Cholesterol (Calc)  Date Value Ref Range Status  06/30/2019 54 mg/dL (calc) Final    Comment:    Reference range: <100 . Desirable range <100 mg/dL for primary prevention;   <70 mg/dL for patients with CHD or diabetic patients  with > or = 2 CHD risk factors. Marland Kitchen LDL-C is now calculated using the Martin-Hopkins   calculation, which is a validated novel method providing  better accuracy than the Friedewald equation in the  estimation of LDL-C.  Cresenciano Genre et al. Annamaria Helling. 6333;545(62): 2061-2068  (http://education.[facility].com/faq/FAQ164)          Passed - HDL in normal range and within 360 days    HDL  Date Value Ref Range Status  06/30/2019 61 > OR = 40 mg/dL Final  10/20/2015 48 >39 mg/dL Final         Passed - Triglycerides in normal range and within 360 days    Triglycerides  Date Value Ref Range Status  06/30/2019 36 <150 mg/dL Final         Passed - Patient is not pregnant      Passed - Valid encounter within last 12 months    Recent Outpatient Visits          5 months ago Vascular dementia with behavior disturbance [facility])   [facility]izer, MD   11 months ago Vascular dementia with behavior disturbance [facility])   [facility], MD   1 year ago Vascular dementia with behavior disturbance ([place])  [facility] Steele Sizer, MD   1 year ago Vascular dementia with behavior disturbance [facility])   [facility], MD   1 year ago Memory changes   [facility] Sizer, MD      Future Appointments            In 2 weeks Steele Sizer, [facility] [place]   In 6 months Stoioff, Ronda Fairly, MD [place]   In 8 months  [facility]            lansoprazole (PREVACID) 30 MG capsule 90 capsule 0    Sig: Take 1 capsule (30 mg total) by mouth daily.     Gastroenterology: Proton Pump Inhibitors Passed - 06/15/2020 10:12 AM      Passed - Valid encounter within last 12 months    Recent Outpatient Visits          5 months ago Vascular dementia with behavior disturbance [facility])   [facility], MD   11 months ago Vascular dementia with behavior  disturbance [facility])   [facility], MD   1 year ago Vascular dementia with behavior disturbance [facility])   [facility], MD   1 year ago Vascular dementia with behavior disturbance [facility])   [facility], MD   1 year ago Memory changes   [facility], MD      Future Appointments            In 2 weeks Steele Sizer, MD [facility] [place]   In 6 months Stoioff, Ronda Fairly, MD [place]   In 8 months  [facility], [facility]

## 2020-06-29 NOTE — Progress Notes (Signed)
Name: Eugene Murphy   MRN: [id number]    DOB: 1932/05/10   Date:06/30/2020       Progress Note  Subjective  Chief Complaint  Chief Complaint  Patient presents with  . Dementia  . Hypertension  . Asthma  . Sleep Apnea  . Gastroesophageal Reflux  . Hyperlipidemia    HPI  Dementiawith behavior changes.major depression with psychotic features: his healthwent down around Summer 2019he started goingto multiple doctors. Had abrupt onset of weight lossHe initially hadnausea, weight loss, fatigue. Wife noticed a change in behaviorfeeling down, unable to sleep,pacing all the time, and auditory hallucinations.  We started him on Citalopram andDr. Manuella Ghazi added Seroquel and his appetite and behavior has improved, weight is also stable. He is waking up again around 3 am and keeps his wife awake, pacing and asking for her . Hallucinations has resolved, going to bed too early around 5 pm, advised to keep him awake until around 8 pm   BPH: he sees Dr. Bernardo Heater, he has nocturia, but no urinary frequency, he is stable.    HTN: bp is at goal, he is off medication, lost a lot of weight in the past year ,  no chest pain or palpitation, he denies dizziness.  Malnutrition: his weight has improved a little since last visit, He had lost 75 lbs in the past 2 years, but is eating more protein shakes and ice cream and gained a few pounds since last visit . He had a lot of tests done and unsure of the cause of weight loss. He does not want to do anything else at this time  Hyperlipidemia: taking medication and denies side effects, no chest pain or palpitation We will recheck labs today   Anemia: under the care of Dr. Mike Gip , he has gone back, she advised a dietician evaluation but he is not interested. Unchanged   OSA: he has lost 75  lbs in the past 2  Years and is no longer snoring and Dr. Raul Del has discontinued his CPAP machine . Unchanged  Asthma mild intermittent/COPD: seeing  Dr. 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 Secondary malignant neoplasm of liver and intrahepatic bile duct ([place]) 11/17/2018  . Dementia with behavioral disturbance ([place]) 09/12/2018  . Weight loss 07/06/2018  . Abnormal computed tomography of large intestine   . Benign neoplasm of ascending colon   . Polyp of sigmoid colon   . Colon wall thickening 06/17/2018  . Lesion of liver 06/17/2018  . OSA on CPAP 12/31/2017  . GERD (gastroesophageal reflux disease) 07/25/2016  . Environmental and seasonal allergies 02/29/2016  . Elevated PSA 11/02/2015  . Hyperlipidemia 04/13/2015  . Hypertension 04/06/2015  . BPH with obstruction/lower urinary tract symptoms 04/06/2015  . Hypersomnia with sleep apnea 12/21/2013  . Asthma, mild intermittent 12/21/2013  . Chronic obstructive airway disease with asthma ([place]) 12/21/2013    Past Surgical History:  Procedure Laterality Date  . COLONOSCOPY WITH PROPOFOL N/A 06/19/2018   Procedure: COLONOSCOPY WITH PROPOFOL WITH BIOPSIES;  Surgeon: [name], MD;  Location: [facility];  Service: Endoscopy;  Laterality: N/A;  sleep apnea  . COSMETIC SURGERY Left    Left Arm Plastic Surgery in 1970s    Family History  Problem Relation Age of Onset  . Congestive Heart Failure Father   . Healthy Sister   .  Dementia Brother     Social History   Tobacco Use  . Smoking status: Never Smoker  . Smokeless tobacco: Never Used  Substance Use Topics  . Alcohol use: Yes    Alcohol/week: 0.0 standard drinks    Comment: Occasional     Current Outpatient Medications:  .  Albuterol Sulfate 108 (90 Base) MCG/ACT AEPB, Inhale 2 puffs into the lungs as needed. , Disp: , Rfl:  .  Ascorbic Acid (VITAMIN C) 1000 MG tablet, Take 500 mg by mouth daily. , Disp: , Rfl:  .  aspirin EC 81 MG tablet, Take 81 mg by mouth  daily. Swallow whole., Disp: , Rfl:  .  azelastine (ASTELIN) 0.1 % nasal spray, Place 1 spray into both nostrils as needed. , Disp: , Rfl:  .  Cholecalciferol (VITAMIN D-3) 5000 units TABS, Take 1 tablet by mouth daily. , Disp: , Rfl:  .  citalopram (CELEXA) 20 MG tablet, Take 1 tablet (20 mg total) by mouth daily., Disp: 90 tablet, Rfl: 1 .  doxazosin (CARDURA) 8 MG tablet, Take 1 tablet (8 mg total) by mouth daily., Disp: 90 tablet, Rfl: 3 .  dutasteride (AVODART) 0.5 MG capsule, Take 1 capsule (0.5 mg total) by mouth daily., Disp: 90 capsule, Rfl: 3 .  fluticasone (FLONASE) 50 MCG/ACT nasal spray, , Disp: , Rfl:  .  lansoprazole (PREVACID) 30 MG capsule, Take 1 capsule (30 mg total) by mouth daily., Disp: 90 capsule, Rfl: 0 .  latanoprost (XALATAN) 0.005 % ophthalmic solution, Place 1 drop into both eyes at bedtime. , Disp: , Rfl:  .  levocetirizine (XYZAL) 5 MG tablet, Take 1 tablet (5 mg total) by mouth every evening., Disp: 90 tablet, Rfl: 1 .  mirtazapine (REMERON) 30 MG tablet, Take 1 tablet (30 mg total) by mouth at bedtime., Disp: 90 tablet, Rfl: 1 .  Potassium 99 MG TABS, Take 1 tablet by mouth daily at 8 pm., Disp: , Rfl:  .  pravastatin (PRAVACHOL) 40 MG tablet, Take 1 tablet (40 mg total) by mouth daily., Disp: 90 tablet, Rfl: 1 .  QUEtiapine (SEROQUEL) 25 MG tablet, Take 1 tablet (25 mg total) by mouth 2 (two) times daily., Disp: 90 tablet, Rfl: 1 .  donepezil (ARICEPT) 5 MG tablet, Take by mouth., Disp: , Rfl:  .  Zoster Vaccine Adjuvanted [facility]) injection, Inject 0.5 mLs into the muscle once for 1 dose., Disp: 0.5 mL, Rfl: 1  Allergies  Allergen Reactions  . Shellfish Allergy Swelling    Throat swells    I personally reviewed active problem list, medication list, allergies, family history, social history, health maintenance with the patient/caregiver today.   ROS  Constitutional: Negative for fever or weight change.  Respiratory: Negative for cough and shortness of  breath.   Cardiovascular: Negative for chest pain or palpitations.  Gastrointestinal: Negative for abdominal pain, no bowel changes.  Musculoskeletal: positive  for gait problem but no  joint swelling.  Skin: Negative for rash.  Neurological: Negative for dizziness or headache.  No other specific complaints in a complete review of systems (except as listed in HPI above).  Objective  Vitals:   06/30/20 0924  BP: 114/66  Pulse: 85  Resp: 16  Temp: 98.6 F (37 C)  TempSrc: Oral  SpO2: 99%  Weight: 176 lb 14.4 oz (80.2 kg)  Height: 5\' 7"  (1.702 m)    Body mass index is 27.71 kg/m.  Physical Exam  Constitutional: Patient appears well-developed and malnourished, clothes baggy . No  distress.  HEENT: head atraumatic, normocephalic, pupils equal and reactive to light, neck supple Cardiovascular: Normal rate, regular rhythm and normal heart sounds.  No murmur heard. No BLE edema. Pulmonary/Chest: Effort normal and breath sounds normal. No respiratory distress. Abdominal: Soft.  There is no tenderness. Psychiatric: Patient has a normal mood and affect. behavior is normal. Judgment and thought content normal.  PHQ2/9: Depression screen [facility] 2/9 06/30/2020 02/16/2020 12/29/2019 06/30/2019 09/17/2018  Decreased Interest 0 0 0 0 0  Down, Depressed, Hopeless 0 0 0 0 2  PHQ - 2 Score 0 0 0 0 2  Altered sleeping - - 0 0 0  Tired, decreased energy - - 0 0 1  Change in appetite - - 0 0 0  Feeling bad or failure about yourself  - - 0 0 0  Trouble concentrating - - 0 0 0  Moving slowly or fidgety/restless - - 0 0 0  Suicidal thoughts - - 0 0 0  PHQ-9 Score - - 0 0 3  Difficult doing work/chores - - Not difficult at all Not difficult at all Not difficult at all    phq 9 is negative   Fall Risk: Fall Risk  06/30/2020 02/16/2020 12/29/2019 06/30/2019 11/17/2018  Falls in the past year? 0 0 0 0 0  Number falls in past yr: 0 0 0 0 0  Injury with Fall? 0 0 0 0 0  Risk for fall due to : - Impaired  balance/gait - - -  Follow up - Falls prevention discussed - - -     Functional Status Survey: Is the patient deaf or have difficulty hearing?: No Does the patient have difficulty seeing, even when wearing glasses/contacts?: No Does the patient have difficulty concentrating, remembering, or making decisions?: No Does the patient have difficulty walking or climbing stairs?: No Does the patient have difficulty dressing or bathing?: No Does the patient have difficulty doing errands alone such as visiting a doctor's office or shopping?: No    Assessment & Plan  1. Essential hypertension  - CBC with Differential/Platelet - COMPLETE METABOLIC PANEL WITH GFR  2. Mild protein-calorie malnutrition (HCC)  Discussed other ways to increase calorie intake  3. Dyslipidemia  - pravastatin (PRAVACHOL) 40 MG tablet; Take 1 tablet (40 mg total) by mouth daily.  Dispense: 90 tablet; Refill: 1 - Lipid panel  4. Vascular dementia with behavior disturbance ([place])  Discussed going to bed later, so he does not get up in the middle of the night   5. Need for immunization against influenza  - Flu Vaccine QUAD High Dose(Fluad)  6. Other insomnia  Increasing dose of remeron, but also discussed sleep hygiene - mirtazapine (REMERON) 30 MG tablet; Take 1 tablet (30 mg total) by mouth at bedtime.  Dispense: 90 tablet; Refill: 1  7. Severe major depression with psychotic features (HCC)  - citalopram (CELEXA) 20 MG tablet; Take 1 tablet (20 mg total) by mouth daily.  Dispense: 90 tablet; Refill: 1  8. Anemia, unspecified type  - CBC with Differential/Platelet - Iron, TIBC and Ferritin Panel  9. Need for shingles vaccine  - Zoster Vaccine Adjuvanted [facility]) injection; Inject 0.5 mLs into the muscle once for 1 dose.  Dispense: 0.5 mL; Refill: 1

## 2020-06-30 ENCOUNTER — Ambulatory Visit: Payer: Medicare HMO | Admitting: Family Medicine

## 2020-06-30 ENCOUNTER — Other Ambulatory Visit: Payer: Self-pay

## 2020-06-30 ENCOUNTER — Encounter: Payer: Self-pay | Admitting: Family Medicine

## 2020-06-30 VITALS — BP 114/66 | HR 85 | Temp 98.6°F | Resp 16 | Ht 67.0 in | Wt 176.9 lb

## 2020-06-30 DIAGNOSIS — E441 Mild protein-calorie malnutrition: Secondary | ICD-10-CM

## 2020-06-30 DIAGNOSIS — F0151 Vascular dementia with behavioral disturbance: Secondary | ICD-10-CM

## 2020-06-30 DIAGNOSIS — I10 Essential (primary) hypertension: Secondary | ICD-10-CM

## 2020-06-30 DIAGNOSIS — G4709 Other insomnia: Secondary | ICD-10-CM

## 2020-06-30 DIAGNOSIS — F323 Major depressive disorder, single episode, severe with psychotic features: Secondary | ICD-10-CM

## 2020-06-30 DIAGNOSIS — Z23 Encounter for immunization: Secondary | ICD-10-CM

## 2020-06-30 DIAGNOSIS — F01518 Vascular dementia, unspecified severity, with other behavioral disturbance: Secondary | ICD-10-CM

## 2020-06-30 DIAGNOSIS — E785 Hyperlipidemia, unspecified: Secondary | ICD-10-CM

## 2020-06-30 DIAGNOSIS — D649 Anemia, unspecified: Secondary | ICD-10-CM

## 2020-06-30 MED ORDER — CITALOPRAM HYDROBROMIDE 20 MG PO TABS: 20.0000 mg | ORAL_TABLET | Freq: Every day | ORAL | 1 refills | Status: DC

## 2020-06-30 MED ORDER — ZOSTER VAC RECOMB ADJUVANTED 50 MCG/0.5ML IM SUSR: 0.5000 mL | Freq: Once | INTRAMUSCULAR | 1 refills | Status: AC

## 2020-06-30 MED ORDER — PRAVASTATIN SODIUM 40 MG PO TABS: 40.0000 mg | ORAL_TABLET | Freq: Every day | ORAL | 1 refills | Status: DC

## 2020-06-30 MED ORDER — ZOSTER VAC RECOMB ADJUVANTED 50 MCG/0.5ML IM SUSR: 0.5000 mL | Freq: Once | INTRAMUSCULAR | 1 refills | Status: DC

## 2020-06-30 MED ORDER — MIRTAZAPINE 30 MG PO TABS: 30.0000 mg | ORAL_TABLET | Freq: Every day | ORAL | 1 refills | Status: DC

## 2020-07-01 LAB — COMPLETE METABOLIC PANEL WITH GFR
AG Ratio: 1.9 (calc) (ref 1.0–2.5)
ALT: 26 U/L (ref 9–46)
AST: 23 U/L (ref 10–35)
Albumin: 4.4 g/dL (ref 3.6–5.1)
Alkaline phosphatase (APISO): 43 U/L (ref 35–144)
BUN/Creatinine Ratio: 27 (calc) — ABNORMAL HIGH (ref 6–22)
BUN: 27 mg/dL — ABNORMAL HIGH (ref 7–25)
CO2: 31 mmol/L (ref 20–32)
Calcium: 9.6 mg/dL (ref 8.6–10.3)
Chloride: 102 mmol/L (ref 98–110)
Creat: 0.99 mg/dL (ref 0.70–1.11)
GFR, Est African American: 80 mL/min/{1.73_m2} (ref >=60)
GFR, Est Non African American: 69 mL/min/{1.73_m2} (ref >=60)
Globulin: 2.3 g/dL (calc) (ref 1.9–3.7)
Glucose, Bld: 83 mg/dL (ref 65–99)
Potassium: 4.8 mmol/L (ref 3.5–5.3)
Sodium: 140 mmol/L (ref 135–146)
Total Bilirubin: 0.5 mg/dL (ref 0.2–1.2)
Total Protein: 6.7 g/dL (ref 6.1–8.1)

## 2020-07-01 LAB — CBC WITH DIFFERENTIAL/PLATELET
Absolute Monocytes: 653 cells/uL (ref 200–950)
Basophils Absolute: 32 cells/uL (ref 0–200)
Basophils Relative: 0.7 %
Eosinophils Absolute: 400 cells/uL (ref 15–500)
Eosinophils Relative: 8.7 %
HCT: 37 % — ABNORMAL LOW (ref 38.5–50.0)
Hemoglobin: 12.4 g/dL — ABNORMAL LOW (ref 13.2–17.1)
Lymphs Abs: 1348 cells/uL (ref 850–3900)
MCH: 31.7 pg (ref 27.0–33.0)
MCHC: 33.5 g/dL (ref 32.0–36.0)
MCV: 94.6 fL (ref 80.0–100.0)
MPV: 11.3 fL (ref 7.5–12.5)
Monocytes Relative: 14.2 %
Neutro Abs: 2167 cells/uL (ref 1500–7800)
Neutrophils Relative %: 47.1 %
Platelets: 164 10*3/uL (ref 140–400)
RBC: 3.91 10*6/uL — ABNORMAL LOW (ref 4.20–5.80)
RDW: 11.8 % (ref 11.0–15.0)
Total Lymphocyte: 29.3 %
WBC: 4.6 10*3/uL (ref 3.8–10.8)

## 2020-07-01 LAB — IRON,TIBC AND FERRITIN PANEL
%SAT: 26 % (calc) (ref 20–48)
Ferritin: 159 ng/mL (ref 24–380)
Iron: 67 ug/dL (ref 50–180)
TIBC: 255 mcg/dL (calc) (ref 250–425)

## 2020-07-01 LAB — LIPID PANEL
Cholesterol: 113 mg/dL (ref <200)
HDL: 58 mg/dL (ref >=40)
LDL Cholesterol (Calc): 43 mg/dL (calc)
Non-HDL Cholesterol (Calc): 55 mg/dL (calc) (ref <130)
Total CHOL/HDL Ratio: 1.9 (calc) (ref <5.0)
Triglycerides: 50 mg/dL (ref <150)

## 2020-09-20 ENCOUNTER — Telehealth: Payer: Self-pay | Admitting: Urology

## 2020-09-20 ENCOUNTER — Other Ambulatory Visit: Payer: Self-pay | Admitting: Family Medicine

## 2020-09-20 DIAGNOSIS — K219 Gastro-esophageal reflux disease without esophagitis: Secondary | ICD-10-CM

## 2020-09-20 DIAGNOSIS — N401 Enlarged prostate with lower urinary tract symptoms: Secondary | ICD-10-CM

## 2020-09-20 MED ORDER — LANSOPRAZOLE 30 MG PO CPDR: 30.0000 mg | DELAYED_RELEASE_CAPSULE | Freq: Every day | ORAL | 1 refills | Status: DC

## 2020-09-20 MED ORDER — DUTASTERIDE 0.5 MG PO CAPS: 0.5000 mg | ORAL_CAPSULE | Freq: Every day | ORAL | 3 refills | Status: DC

## 2020-09-20 NOTE — Telephone Encounter (Signed)
Patient needs a refill on his AVODART to be called into his mail order pharmacy  thanks

## 2020-09-20 NOTE — Telephone Encounter (Signed)
Medication Refill - Medication: lansoprazole (PREVACID) 30 MG capsule (Patient requesting 90 day supply with 1 refill)   Has the patient contacted their pharmacy? yes (Agent: If no, request that the patient contact the pharmacy for the refill.) (Agent: If yes, when and what did the pharmacy advise?)Contact PCP  Preferred Pharmacy (with phone number or street name):  [place], [place] Phone:  [phone]  Fax:  [phone]       Agent: Please be advised that RX refills may take up to 3 business days. We ask that you follow-up with your pharmacy.

## 2020-12-07 ENCOUNTER — Other Ambulatory Visit: Payer: Self-pay | Admitting: Family Medicine

## 2020-12-07 DIAGNOSIS — G4709 Other insomnia: Secondary | ICD-10-CM

## 2020-12-07 MED ORDER — MIRTAZAPINE 30 MG PO TABS: 30.0000 mg | ORAL_TABLET | Freq: Every day | ORAL | 0 refills | Status: DC

## 2020-12-07 NOTE — Telephone Encounter (Signed)
Copied from [place] [phone]. Topic: Quick Communication - Rx Refill/Question >> Dec 07, 2020  3:02 PM Mcneil, Ja-Kwan wrote: Medication: mirtazapine (REMERON) 30 MG tablet   Has the patient contacted their pharmacy? yes   Preferred Pharmacy (with phone number or street name): [facility] - [address], [place] Phone: [phone]   Fax: [phone]  Agent: Please be advised that RX refills may take up to 3 business days. We ask that you follow-up with your pharmacy.

## 2020-12-08 ENCOUNTER — Other Ambulatory Visit: Payer: Self-pay

## 2020-12-08 DIAGNOSIS — G4709 Other insomnia: Secondary | ICD-10-CM

## 2020-12-08 MED ORDER — MIRTAZAPINE 30 MG PO TABS: 30.0000 mg | ORAL_TABLET | Freq: Every day | ORAL | 0 refills | Status: DC

## 2020-12-12 ENCOUNTER — Encounter: Payer: Self-pay | Admitting: Urology

## 2020-12-12 ENCOUNTER — Ambulatory Visit: Payer: Medicare HMO | Admitting: Urology

## 2020-12-12 ENCOUNTER — Other Ambulatory Visit: Payer: Self-pay

## 2020-12-12 VITALS — BP 135/78 | Ht 69.0 in | Wt 180.0 lb

## 2020-12-12 DIAGNOSIS — N401 Enlarged prostate with lower urinary tract symptoms: Secondary | ICD-10-CM

## 2020-12-12 DIAGNOSIS — N138 Other obstructive and reflux uropathy: Secondary | ICD-10-CM

## 2020-12-12 LAB — BLADDER SCAN AMB NON-IMAGING: Scan Result: 0

## 2020-12-12 MED ORDER — DUTASTERIDE 0.5 MG PO CAPS: 0.5000 mg | ORAL_CAPSULE | Freq: Every day | ORAL | 3 refills | Status: DC

## 2020-12-12 MED ORDER — DOXAZOSIN MESYLATE 8 MG PO TABS: 8.0000 mg | ORAL_TABLET | Freq: Every day | ORAL | 3 refills | Status: DC

## 2020-12-12 NOTE — Progress Notes (Signed)
12/12/2020 1:35 PM   Eugene Murphy [birth year] [id number]  Referring provider: Steele Sizer, MD [address] [place],  [place] [address]  Chief Complaint  Patient presents with  . Benign Prostatic Hypertrophy    HPI: 87 y.o. male presents for annual follow-up BPH.   No significant changes since last years visit  Remains on dutasteride 1 tab weekly and doxazosin 8 mg daily  No bothersome LUTS  Denies dysuria, gross hematuria  No lightheadedness  Denies flank, abdominal or pelvic pain  PMH: Past Medical History:  Diagnosis Date  . Arthritis    lower back  . Asthma   . BPH (benign prostatic hyperplasia)    Followed by Bonna Gains.  Marland Kitchen GERD (gastroesophageal reflux disease)   . Glaucoma   . Hyperlipidemia   . Hypertension   . Sleep apnea    CPAP    Surgical History: Past Surgical History:  Procedure Laterality Date  . COLONOSCOPY WITH PROPOFOL N/A 06/19/2018   Procedure: COLONOSCOPY WITH PROPOFOL WITH BIOPSIES;  Surgeon: [name], MD;  Location: [place];  Service: Endoscopy;  Laterality: N/A;  sleep apnea  . COSMETIC SURGERY Left    Left Arm Plastic Surgery in 1970s    Home Medications:  Allergies as of 12/12/2020      Reactions   Shellfish Allergy Swelling   Throat swells      Medication List       Accurate as of December 12, 2020  1:35 PM. If you have any questions, ask your nurse or doctor.        Albuterol Sulfate 108 (90 Base) MCG/ACT Aepb Commonly known as: PROAIR RESPICLICK Inhale 2 puffs into the lungs as needed.   aspirin EC 81 MG tablet Take 81 mg by mouth daily. Swallow whole.   azelastine 0.1 % nasal spray Commonly known as: ASTELIN Place 1 spray into both nostrils as needed.   citalopram 20 MG tablet Commonly known as: CELEXA Take 1 tablet (20 mg total) by mouth daily.   donepezil 5 MG tablet Commonly known as: ARICEPT Take by mouth.   doxazosin 8 MG tablet Commonly known as: CARDURA Take 1  tablet (8 mg total) by mouth daily.   dutasteride 0.5 MG capsule Commonly known as: Avodart Take 1 capsule (0.5 mg total) by mouth daily.   fluticasone 50 MCG/ACT nasal spray Commonly known as: FLONASE   lansoprazole 30 MG capsule Commonly known as: PREVACID Take 1 capsule (30 mg total) by mouth daily.   latanoprost 0.005 % ophthalmic solution Commonly known as: XALATAN Place 1 drop into both eyes at bedtime.   levocetirizine 5 MG tablet Commonly known as: XYZAL Take 1 tablet (5 mg total) by mouth every evening.   mirtazapine 30 MG tablet Commonly known as: REMERON Take 1 tablet (30 mg total) by mouth at bedtime.   Potassium 99 MG Tabs Take 1 tablet by mouth daily at 8 pm.   pravastatin 40 MG tablet Commonly known as: PRAVACHOL Take 1 tablet (40 mg total) by mouth daily.   QUEtiapine 25 MG tablet Commonly known as: SEROQUEL Take 1 tablet (25 mg total) by mouth 2 (two) times daily.   vitamin C 1000 MG tablet Take 500 mg by mouth daily.   Vitamin D-3 125 MCG (5000 UT) Tabs Take 1 tablet by mouth daily.       Allergies:  Allergies  Allergen Reactions  . Shellfish Allergy Swelling    Throat swells    Family History: Family History  Problem Relation Age of Onset  . Congestive Heart Failure Father   . Healthy Sister   . Dementia Brother     Social History:  reports that he has never smoked. He has never used smokeless tobacco. He reports current alcohol use. He reports that he does not use drugs.   Physical Exam: BP 135/78   Ht 5\' 9"  (1.753 m)   Wt 180 lb (81.6 kg)   BMI 26.58 kg/m   Constitutional:  Alert and oriented, No acute distress. HEENT: [place] AT, moist mucus membranes.  Trachea midline, no masses. Cardiovascular: No clubbing, cyanosis, or edema. Respiratory: Normal respiratory effort, no increased work of breathing.  Laboratory Data:  Urinalysis Dipstick/microscopy negative   Assessment & Plan:    1. BPH with obstruction/lower urinary  tract symptoms  Stable LUTS on doxazosin/dutasteride  Doxazosin refilled  Bladder scan PVR 0 mL  Continue annual follow-up   Abbie Sons, MD  [place] [address], [place], [place] [address] [phone]

## 2020-12-14 LAB — URINALYSIS, COMPLETE
Bilirubin, UA: NEGATIVE
Glucose, UA: NEGATIVE
Leukocytes,UA: NEGATIVE
Nitrite, UA: NEGATIVE
Protein,UA: NEGATIVE
RBC, UA: NEGATIVE
Specific Gravity, UA: 1.025 (ref 1.005–1.030)
Urobilinogen, Ur: 0.2 mg/dL (ref 0.2–1.0)
pH, UA: 5.5 (ref 5.0–7.5)

## 2020-12-14 LAB — MICROSCOPIC EXAMINATION
Bacteria, UA: NONE SEEN
Epithelial Cells (non renal): NONE SEEN /hpf (ref 0–10)
RBC, Urine: NONE SEEN /hpf (ref 0–2)
WBC, UA: NONE SEEN /hpf (ref 0–5)

## 2020-12-26 NOTE — Progress Notes (Addendum)
Name: Eugene Murphy   MRN: [id number]    DOB: 1932/08/23   Date:12/28/2020       Progress Note  Subjective  Chief Complaint  Follow Up  HPI  Dementiawith behavior changes/vascular dementia/depression with psychotic features:: his healthwent down around Summer 2019he started goingto multiple doctors. Had abrupt onset of weight lossHe initially hadnausea, weight loss, fatigue. Wife noticed a change in behaviorfeeling down, unable to sleep,pacing all the time, and auditory hallucinations.  We started him on Citalopram andDr. Manuella Ghazi added Seroquel and is now doing well, memory has improved, no longer staying up during the night, no longer having hallucinations, wife said doing much better than he was. Appetite has improved.   BPH: he sees Dr. Bernardo Heater, he states nocturia has improved, usually once per night. Up to date with follow up    HTN: bp is at goal, he is off medication, lost a lot of weight in the past year ,  no chest pain or palpitation, he denies dizziness.  Malnutrition:  He had lost 75 lbs in a period of 2 years, he states appetite is good, eating all meals and still takes protein shakes in am's. His weight has  been stable over the last year  with a slight gain of 3 lbs in the past 6 months.   Hyperlipidemia: taking medication and denies side effects, no chest pain or palpitation Last LDL was down to 43, HDL also at goal at 58   Anemia of chronic diease: no longer seeing Dr. Mike Gip, last levels stable, iron storage normal   OSA: he has lost 75  lbs in a period of 2  Years and is no longer snoring and Dr. Raul Del has discontinued his CPAP machine . Unchanged   Asthma mild intermittent/COPD: seeing Dr. 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 Only using albuterol prn, takes singulair and controls allergies with flonase    Patient Active Problem List   Diagnosis Date  Noted  . Secondary malignant neoplasm of liver and intrahepatic bile duct ([place]) 11/17/2018  . Dementia with behavioral disturbance ([place]) 09/12/2018  . Weight loss 07/06/2018  . Abnormal computed tomography of large intestine   . Benign neoplasm of ascending colon   . Polyp of sigmoid colon   . Colon wall thickening 06/17/2018  . Lesion of liver 06/17/2018  . OSA on CPAP 12/31/2017  . GERD (gastroesophageal reflux disease) 07/25/2016  . Environmental and seasonal allergies 02/29/2016  . Elevated PSA 11/02/2015  . Hyperlipidemia 04/13/2015  . Hypertension 04/06/2015  . BPH with obstruction/lower urinary tract symptoms 04/06/2015  . Hypersomnia with sleep apnea 12/21/2013  . Asthma, mild intermittent 12/21/2013  . Chronic obstructive airway disease with asthma ([place]) 12/21/2013    Past Surgical History:  Procedure Laterality Date  . COLONOSCOPY WITH PROPOFOL N/A 06/19/2018   Procedure: COLONOSCOPY WITH PROPOFOL WITH BIOPSIES;  Surgeon: [name], MD;  Location: [facility];  Service: Endoscopy;  Laterality: N/A;  sleep apnea  . COSMETIC SURGERY Left    Left Arm Plastic Surgery in 1970s    Family History  Problem Relation Age of Onset  . Congestive Heart Failure Father   . Healthy Sister   . Dementia Brother     Social History   Tobacco Use  . Smoking status: Never Smoker  . Smokeless tobacco: Never Used  Substance Use Topics  . Alcohol use: Yes    Alcohol/week:  0.0 standard drinks    Comment: Occasional     Current Outpatient Medications:  .  Albuterol Sulfate 108 (90 Base) MCG/ACT AEPB, Inhale 2 puffs into the lungs as needed. , Disp: , Rfl:  .  Ascorbic Acid (VITAMIN C) 1000 MG tablet, Take 500 mg by mouth daily. , Disp: , Rfl:  .  aspirin EC 81 MG tablet, Take 81 mg by mouth daily. Swallow whole., Disp: , Rfl:  .  azelastine (ASTELIN) 0.1 % nasal spray, Place 1 spray into both nostrils as needed. , Disp: , Rfl:  .  Cholecalciferol (VITAMIN D-3) 5000 units  TABS, Take 1 tablet by mouth daily. , Disp: , Rfl:  .  citalopram (CELEXA) 20 MG tablet, Take 1 tablet (20 mg total) by mouth daily., Disp: 90 tablet, Rfl: 1 .  doxazosin (CARDURA) 8 MG tablet, Take 1 tablet (8 mg total) by mouth daily., Disp: 90 tablet, Rfl: 3 .  dutasteride (AVODART) 0.5 MG capsule, Take 1 capsule (0.5 mg total) by mouth daily., Disp: 90 capsule, Rfl: 3 .  fluticasone (FLONASE) 50 MCG/ACT nasal spray, , Disp: , Rfl:  .  lansoprazole (PREVACID) 30 MG capsule, Take 1 capsule (30 mg total) by mouth daily., Disp: 90 capsule, Rfl: 1 .  latanoprost (XALATAN) 0.005 % ophthalmic solution, Place 1 drop into both eyes at bedtime. , Disp: , Rfl:  .  levocetirizine (XYZAL) 5 MG tablet, Take 1 tablet (5 mg total) by mouth every evening., Disp: 90 tablet, Rfl: 1 .  mirtazapine (REMERON) 30 MG tablet, Take 1 tablet (30 mg total) by mouth at bedtime., Disp: 90 tablet, Rfl: 0 .  Potassium 99 MG TABS, Take 1 tablet by mouth daily at 8 pm., Disp: , Rfl:  .  pravastatin (PRAVACHOL) 40 MG tablet, Take 1 tablet (40 mg total) by mouth daily., Disp: 90 tablet, Rfl: 1 .  QUEtiapine (SEROQUEL) 25 MG tablet, Take 1 tablet (25 mg total) by mouth 2 (two) times daily., Disp: 90 tablet, Rfl: 1 .  donepezil (ARICEPT) 5 MG tablet, Take by mouth., Disp: , Rfl:   Allergies  Allergen Reactions  . Shellfish Allergy Swelling    Throat swells    I personally reviewed active problem list, medication list, allergies, family history, social history, health maintenance with the patient/caregiver today.   ROS  Constitutional: Negative for fever or weight change.  Respiratory: Negative for cough and shortness of breath.   Cardiovascular: Negative for chest pain or palpitations.  Gastrointestinal: Negative for abdominal pain, no bowel changes.  Musculoskeletal: Negative for gait problem or joint swelling.  Skin: Negative for rash.  Neurological: Negative for dizziness or headache.  No other specific complaints  in a complete review of systems (except as listed in HPI above).  Objective  Vitals:   12/28/20 0852  BP: 116/62  Pulse: 91  Resp: 16  Temp: 98.5 F (36.9 C)  SpO2: 97%  Weight: 179 lb 6.4 oz (81.4 kg)  Height: 5\' 9"  (1.753 m)    Body mass index is 26.49 kg/m.  Physical Exam  Constitutional: Patient appears well-developed and very thin with temporal waisting .  No distress.  HEENT: head atraumatic, normocephalic, pupils equal and reactive to light,  neck supple Cardiovascular: Normal rate, regular rhythm and normal heart sounds.  No murmur heard. No BLE edema. Pulmonary/Chest: Effort normal and breath sounds normal. No respiratory distress. Abdominal: Soft.  There is no tenderness. Psychiatric: Patient has a normal mood and affect. behavior is normal. Judgment and thought  content normal.  Recent Results (from the past 2160 hour(s))  Urinalysis, Complete     Status: Abnormal   Collection Time: 12/12/20  1:18 PM  Result Value Ref Range   Specific Gravity, UA 1.025 1.005 - 1.030   pH, UA 5.5 5.0 - 7.5   Color, UA Yellow Yellow   Appearance Ur Clear Clear   Leukocytes,UA Negative Negative   Protein,UA Negative Negative/Trace   Glucose, UA Negative Negative   Ketones, UA Trace (A) Negative   RBC, UA Negative Negative   Bilirubin, UA Negative Negative   Urobilinogen, Ur 0.2 0.2 - 1.0 mg/dL   Nitrite, UA Negative Negative   Microscopic Examination See below:   Microscopic Examination     Status: None   Collection Time: 12/12/20  1:18 PM   Urine  Result Value Ref Range   WBC, UA None seen 0 - 5 /hpf   RBC None seen 0 - 2 /hpf   Epithelial Cells (non renal) None seen 0 - 10 /hpf   Bacteria, UA None seen None seen/Few  Bladder Scan (Post Void Residual) in office     Status: None   Collection Time: 12/12/20  1:33 PM  Result Value Ref Range   Scan Result 0      PHQ2/9: Depression screen [facility] 2/9 12/28/2020 06/30/2020 02/16/2020 12/29/2019 06/30/2019  Decreased Interest 0 0  0 0 0  Down, Depressed, Hopeless 0 0 0 0 0  PHQ - 2 Score 0 0 0 0 0  Altered sleeping 0 - - 0 0  Tired, decreased energy 0 - - 0 0  Change in appetite 0 - - 0 0  Feeling bad or failure about yourself  0 - - 0 0  Trouble concentrating 0 - - 0 0  Moving slowly or fidgety/restless 0 - - 0 0  Suicidal thoughts 0 - - 0 0  PHQ-9 Score 0 - - 0 0  Difficult doing work/chores Not difficult at all - - Not difficult at all Not difficult at all  Some recent data might be hidden    phq 9 is negative   Fall Risk: Fall Risk  12/28/2020 06/30/2020 02/16/2020 12/29/2019 06/30/2019  Falls in the past year? 0 0 0 0 0  Number falls in past yr: 0 0 0 0 0  Injury with Fall? 0 0 0 0 0  Risk for fall due to : - - Impaired balance/gait - -  Follow up - - Falls prevention discussed - -     Functional Status Survey: Is the patient deaf or have difficulty hearing?: No Does the patient have difficulty seeing, even when wearing glasses/contacts?: No Does the patient have difficulty concentrating, remembering, or making decisions?: No Does the patient have difficulty walking or climbing stairs?: Yes Does the patient have difficulty dressing or bathing?: No Does the patient have difficulty doing errands alone such as visiting a doctor's office or shopping?: Yes    Assessment & Plan  1. Major depression with psychotic features (HCC)  - citalopram (CELEXA) 20 MG tablet; Take 1 tablet (20 mg total) by mouth daily.  Dispense: 90 tablet; Refill: 1  2. Dyslipidemia  - pravastatin (PRAVACHOL) 40 MG tablet; Take 1 tablet (40 mg total) by mouth daily.  Dispense: 90 tablet; Refill: 1  3. Vascular dementia with behavior disturbance (HCC)  - QUEtiapine (SEROQUEL) 25 MG tablet; Take 1 tablet (25 mg total) by mouth 2 (two) times daily.  Dispense: 90 tablet; Refill: 1  4. Mild protein-calorie  malnutrition ([place])  Doing better   5. BPH without obstruction/lower urinary tract symptoms   6. History of  hypertension   7. COPD with asthma ([place])  Keep follow up with Dr. Raul Del   8. Other insomnia  - mirtazapine (REMERON) 30 MG tablet; Take 1 tablet (30 mg total) by mouth at bedtime.  Dispense: 90 tablet; Refill: 1

## 2020-12-28 ENCOUNTER — Encounter: Payer: Self-pay | Admitting: Family Medicine

## 2020-12-28 ENCOUNTER — Ambulatory Visit (INDEPENDENT_AMBULATORY_CARE_PROVIDER_SITE_OTHER): Payer: Medicare HMO | Admitting: Family Medicine

## 2020-12-28 ENCOUNTER — Other Ambulatory Visit: Payer: Self-pay

## 2020-12-28 VITALS — BP 116/62 | HR 91 | Temp 98.5°F | Resp 16 | Ht 69.0 in | Wt 179.4 lb

## 2020-12-28 DIAGNOSIS — F323 Major depressive disorder, single episode, severe with psychotic features: Secondary | ICD-10-CM | POA: Insufficient documentation

## 2020-12-28 DIAGNOSIS — E441 Mild protein-calorie malnutrition: Secondary | ICD-10-CM | POA: Insufficient documentation

## 2020-12-28 DIAGNOSIS — E785 Hyperlipidemia, unspecified: Secondary | ICD-10-CM | POA: Diagnosis not present

## 2020-12-28 DIAGNOSIS — F0151 Vascular dementia with behavioral disturbance: Secondary | ICD-10-CM | POA: Diagnosis not present

## 2020-12-28 DIAGNOSIS — N400 Enlarged prostate without lower urinary tract symptoms: Secondary | ICD-10-CM

## 2020-12-28 DIAGNOSIS — J449 Chronic obstructive pulmonary disease, unspecified: Secondary | ICD-10-CM

## 2020-12-28 DIAGNOSIS — Z8679 Personal history of other diseases of the circulatory system: Secondary | ICD-10-CM

## 2020-12-28 DIAGNOSIS — F01518 Vascular dementia, unspecified severity, with other behavioral disturbance: Secondary | ICD-10-CM

## 2020-12-28 DIAGNOSIS — G4709 Other insomnia: Secondary | ICD-10-CM

## 2020-12-28 MED ORDER — CITALOPRAM HYDROBROMIDE 20 MG PO TABS: 20.0000 mg | ORAL_TABLET | Freq: Every day | ORAL | 1 refills | Status: DC

## 2020-12-28 MED ORDER — PRAVASTATIN SODIUM 40 MG PO TABS
40.0000 mg | ORAL_TABLET | Freq: Every day | ORAL | 1 refills | Status: DC
Start: 2020-12-28 — End: 2021-07-25

## 2020-12-28 MED ORDER — QUETIAPINE FUMARATE 25 MG PO TABS: 25.0000 mg | ORAL_TABLET | Freq: Two times a day (BID) | ORAL | 1 refills | Status: DC

## 2020-12-28 MED ORDER — MIRTAZAPINE 30 MG PO TABS: 30.0000 mg | ORAL_TABLET | Freq: Every day | ORAL | 1 refills | Status: DC

## 2021-02-16 ENCOUNTER — Ambulatory Visit: Payer: Medicare HMO

## 2021-03-03 ENCOUNTER — Other Ambulatory Visit: Payer: Self-pay | Admitting: Family Medicine

## 2021-03-03 DIAGNOSIS — K219 Gastro-esophageal reflux disease without esophagitis: Secondary | ICD-10-CM

## 2021-03-03 MED ORDER — LANSOPRAZOLE 30 MG PO CPDR: 30.0000 mg | DELAYED_RELEASE_CAPSULE | Freq: Every day | ORAL | 1 refills | Status: DC

## 2021-03-03 NOTE — Telephone Encounter (Signed)
Copied from [place] [id number]. Topic: Quick Communication - Rx Refill/Question >> Mar 03, 2021 10:54 AM Yvette Rack wrote: Medication: lansoprazole (PREVACID) 30 MG capsule  Has the patient contacted their pharmacy? yes - Pt told to contact provider  Preferred Pharmacy (with phone number or street name): [facility] - [address], [address] [place]  Phone: [phone]   Fax: [phone]  Agent: Please be advised that RX refills may take up to 3 business days. We ask that you follow-up with your pharmacy.

## 2021-06-27 ENCOUNTER — Ambulatory Visit (INDEPENDENT_AMBULATORY_CARE_PROVIDER_SITE_OTHER): Payer: Medicare HMO

## 2021-06-27 DIAGNOSIS — Z0000 Encounter for general adult medical examination without abnormal findings: Secondary | ICD-10-CM

## 2021-06-27 NOTE — Patient Instructions (Signed)
Mr. Eugene Murphy , Thank you for taking time to come for your Medicare Wellness Visit. I appreciate your ongoing commitment to your health goals. Please review the following plan we discussed and let me know if I can assist you in the future.   Screening recommendations/referrals: Colonoscopy: no longer required Recommended yearly ophthalmology/optometry visit for glaucoma screening and checkup Recommended yearly dental visit for hygiene and checkup  Vaccinations: Influenza vaccine: due Pneumococcal vaccine: done 12/01/13 Tdap vaccine: due Shingles vaccine: done 12/08/20 & 04/04/21   Covid-19: done 11/25/19, 12/16/19 & 07/17/20  Advanced directives: Advance directive discussed with you today. I have provided a copy for you to complete at home and have notarized. Once this is complete please bring a copy in to our office so we can scan it into your chart.   Conditions/risks identified: Recommend continuing fall prevention in the home  Next appointment: Follow up in one year for your annual wellness visit.   Preventive Care 24 Years and Older, Male Preventive care refers to lifestyle choices and visits with your health care provider that can promote health and wellness. What does preventive care include? A yearly physical exam. This is also called an annual well check. Dental exams once or twice a year. Routine eye exams. Ask your health care provider how often you should have your eyes checked. Personal lifestyle choices, including: Daily care of your teeth and gums. Regular physical activity. Eating a healthy diet. Avoiding tobacco and drug use. Limiting alcohol use. Practicing safe sex. Taking low doses of aspirin every day. Taking vitamin and mineral supplements as recommended by your health care provider. What happens during an annual well check? The services and screenings done by your health care provider during your annual well check will depend on your age, overall health, lifestyle  risk factors, and family history of disease. Counseling  Your health care provider may ask you questions about your: Alcohol use. Tobacco use. Drug use. Emotional well-being. Home and relationship well-being. Sexual activity. Eating habits. History of falls. Memory and ability to understand (cognition). Work and work Statistician. Screening  You may have the following tests or measurements: Height, weight, and BMI. Blood pressure. Lipid and cholesterol levels. These may be checked every 5 years, or more frequently if you are over 67 years old. Skin check. Lung cancer screening. You may have this screening every year starting at age 63 if you have a 30-pack-year history of smoking and currently smoke or have quit within the past 15 years. Fecal occult blood test (FOBT) of the stool. You may have this test every year starting at age 58. Flexible sigmoidoscopy or colonoscopy. You may have a sigmoidoscopy every 5 years or a colonoscopy every 10 years starting at age 38. Prostate cancer screening. Recommendations will vary depending on your family history and other risks. Hepatitis C blood test. Hepatitis B blood test. Sexually transmitted disease (STD) testing. Diabetes screening. This is done by checking your blood sugar (glucose) after you have not eaten for a while (fasting). You may have this done every 1-3 years. Abdominal aortic aneurysm (AAA) screening. You may need this if you are a current or former smoker. Osteoporosis. You may be screened starting at age 5 if you are at high risk. Talk with your health care provider about your test results, treatment options, and if necessary, the need for more tests. Vaccines  Your health care provider may recommend certain vaccines, such as: Influenza vaccine. This is recommended every year. Tetanus, diphtheria, and acellular pertussis (  Tdap, Td) vaccine. You may need a Td booster every 10 years. Zoster vaccine. You may need this after age  16. Pneumococcal 13-valent conjugate (PCV13) vaccine. One dose is recommended after age 65. Pneumococcal polysaccharide (PPSV23) vaccine. One dose is recommended after age 66. Talk to your health care provider about which screenings and vaccines you need and how often you need them. This information is not intended to replace advice given to you by your health care provider. Make sure you discuss any questions you have with your health care provider. Document Released: 10/21/2015 Document Revised: 06/13/2016 Document Reviewed: 07/26/2015 Elsevier Interactive Patient Education  2017 [employer] Prevention in the Home Falls can cause injuries. They can happen to people of all ages. There are many things you can do to make your home safe and to help prevent falls. What can I do on the outside of my home? Regularly fix the edges of walkways and driveways and fix any cracks. Remove anything that might make you trip as you walk through a door, such as a raised step or threshold. Trim any bushes or trees on the path to your home. Use bright outdoor lighting. Clear any walking paths of anything that might make someone trip, such as rocks or tools. Regularly check to see if handrails are loose or broken. Make sure that both sides of any steps have handrails. Any raised decks and porches should have guardrails on the edges. Have any leaves, snow, or ice cleared regularly. Use sand or salt on walking paths during winter. Clean up any spills in your garage right away. This includes oil or grease spills. What can I do in the bathroom? Use night lights. Install grab bars by the toilet and in the tub and shower. Do not use towel bars as grab bars. Use non-skid mats or decals in the tub or shower. If you need to sit down in the shower, use a plastic, non-slip stool. Keep the floor dry. Clean up any water that spills on the floor as soon as it happens. Remove soap buildup in the tub or shower  regularly. Attach bath mats securely with double-sided non-slip rug tape. Do not have throw rugs and other things on the floor that can make you trip. What can I do in the bedroom? Use night lights. Make sure that you have a light by your bed that is easy to reach. Do not use any sheets or blankets that are too big for your bed. They should not hang down onto the floor. Have a firm chair that has side arms. You can use this for support while you get dressed. Do not have throw rugs and other things on the floor that can make you trip. What can I do in the kitchen? Clean up any spills right away. Avoid walking on wet floors. Keep items that you use a lot in easy-to-reach places. If you need to reach something above you, use a strong step stool that has a grab bar. Keep electrical cords out of the way. Do not use floor polish or wax that makes floors slippery. If you must use wax, use non-skid floor wax. Do not have throw rugs and other things on the floor that can make you trip. What can I do with my stairs? Do not leave any items on the stairs. Make sure that there are handrails on both sides of the stairs and use them. Fix handrails that are broken or loose. Make sure that handrails are  as long as the stairways. Check any carpeting to make sure that it is firmly attached to the stairs. Fix any carpet that is loose or worn. Avoid having throw rugs at the top or bottom of the stairs. If you do have throw rugs, attach them to the floor with carpet tape. Make sure that you have a light switch at the top of the stairs and the bottom of the stairs. If you do not have them, ask someone to add them for you. What else can I do to help prevent falls? Wear shoes that: Do not have high heels. Have rubber bottoms. Are comfortable and fit you well. Are closed at the toe. Do not wear sandals. If you use a stepladder: Make sure that it is fully opened. Do not climb a closed stepladder. Make sure that  both sides of the stepladder are locked into place. Ask someone to hold it for you, if possible. Clearly mark and make sure that you can see: Any grab bars or handrails. First and last steps. Where the edge of each step is. Use tools that help you move around (mobility aids) if they are needed. These include: Canes. Walkers. Scooters. Crutches. Turn on the lights when you go into a dark area. Replace any light bulbs as soon as they burn out. Set up your furniture so you have a clear path. Avoid moving your furniture around. If any of your floors are uneven, fix them. If there are any pets around you, be aware of where they are. Review your medicines with your doctor. Some medicines can make you feel dizzy. This can increase your chance of falling. Ask your doctor what other things that you can do to help prevent falls. This information is not intended to replace advice given to you by your health care provider. Make sure you discuss any questions you have with your health care provider. Document Released: 07/21/2009 Document Revised: 03/01/2016 Document Reviewed: 10/29/2014 Elsevier Interactive Patient Education  2017 [employer].

## 2021-06-27 NOTE — Progress Notes (Signed)
Subjective:   Eugene Murphy is a 87 y.o. male who presents for Medicare Annual/Subsequent preventive examination.  Virtual Visit via Telephone Note  I connected with  Alonza Bogus on 06/27/21 at  9:20 AM EDT by telephone and verified that I am speaking with the correct person using two identifiers.  Location: Patient: home Provider: [place] Persons participating in the virtual visit: patient & wife [place]   I discussed the limitations, risks, security and privacy concerns of performing an evaluation and management service by telephone and the availability of in person appointments. The patient expressed understanding and agreed to proceed.  Interactive audio and video telecommunications were attempted between this nurse and patient, however failed, due to patient having technical difficulties OR patient did not have access to video capability.  We continued and completed visit with audio only.  Some vital signs may be absent or patient reported.   Clemetine Marker, LPN   Review of Systems     Cardiac Risk Factors include: advanced age (>61men, >59 women);dyslipidemia;male gender;hypertension     Objective:    There were no vitals filed for this visit. There is no height or weight on file to calculate BMI.  Advanced Directives 06/27/2021 02/16/2020 07/07/2018 06/23/2018 06/19/2018 06/17/2018 06/15/2018  Does Patient Have a Medical Advance Directive? No No Yes No Yes No No  Type of Advance Directive - - - - Press photographer;Living will - -  Does patient want to make changes to medical advance directive? - - - - - - -  Copy of [place] in Chart? - - - - Yes - -  Would patient like information on creating a medical advance directive? Yes (MAU/Ambulatory/Procedural Areas - Information given) No - Patient declined - No - Patient declined - - No - Patient declined    Current Medications (verified) Outpatient Encounter Medications as of  06/27/2021  Medication Sig   Albuterol Sulfate 108 (90 Base) MCG/ACT AEPB Inhale 2 puffs into the lungs as needed.    Ascorbic Acid (VITAMIN C) 1000 MG tablet Take 500 mg by mouth daily.    aspirin EC 81 MG tablet Take 81 mg by mouth daily. Swallow whole.   Cholecalciferol (VITAMIN D-3) 5000 units TABS Take 1 tablet by mouth daily.    citalopram (CELEXA) 20 MG tablet Take 1 tablet (20 mg total) by mouth daily.   doxazosin (CARDURA) 8 MG tablet Take 1 tablet (8 mg total) by mouth daily.   dutasteride (AVODART) 0.5 MG capsule Take 1 capsule (0.5 mg total) by mouth daily.   fluticasone (FLONASE) 50 MCG/ACT nasal spray Place 2 sprays into both nostrils daily.   lansoprazole (PREVACID) 30 MG capsule Take 1 capsule (30 mg total) by mouth daily.   levocetirizine (XYZAL) 5 MG tablet Take 1 tablet (5 mg total) by mouth every evening.   mirtazapine (REMERON) 30 MG tablet Take 1 tablet (30 mg total) by mouth at bedtime.   Potassium 99 MG TABS Take 1 tablet by mouth daily at 8 pm.   pravastatin (PRAVACHOL) 40 MG tablet Take 1 tablet (40 mg total) by mouth daily.   QUEtiapine (SEROQUEL) 25 MG tablet Take 1 tablet (25 mg total) by mouth 2 (two) times daily.   ROCKLATAN 0.02-0.005 % SOLN Apply 1 drop to eye at bedtime.   donepezil (ARICEPT) 5 MG tablet Take 1 tablet by mouth daily.   [DISCONTINUED] azelastine (ASTELIN) 0.1 % nasal spray Place 1 spray into both nostrils  as needed.    [DISCONTINUED] latanoprost (XALATAN) 0.005 % ophthalmic solution Place 1 drop into both eyes at bedtime.    No facility-administered encounter medications on file as of 06/27/2021.    Allergies (verified) Shellfish allergy   History: Past Medical History:  Diagnosis Date   Arthritis    lower back   Asthma    BPH (benign prostatic hyperplasia)    Followed by Bonna Gains.   GERD (gastroesophageal reflux disease)    Glaucoma    Hyperlipidemia    Hypertension    Sleep apnea    CPAP   Past Surgical History:  Procedure  Laterality Date   COLONOSCOPY WITH PROPOFOL N/A 06/19/2018   Procedure: COLONOSCOPY WITH PROPOFOL WITH BIOPSIES;  Surgeon: [name], MD;  Location: [place] [facility];  Service: Endoscopy;  Laterality: N/A;  sleep apnea   COSMETIC SURGERY Left    Left Arm Plastic Surgery in 1970s   Family History  Problem Relation Age of Onset   Congestive Heart Failure Father    Healthy Sister    Dementia Brother    Social History   Socioeconomic History   Marital status: Married    Spouse name: Flora   Number of children: 3   Years of education: Not on file   Highest education level: 9th grade  Occupational History   Occupation: cab drivers    Comment: retired many years ago   Tobacco Use   Smoking status: Never   Smokeless tobacco: Never  Vaping Use   Vaping Use: Never used  Substance and Sexual Activity   Alcohol use: Not Currently   Drug use: No   Sexual activity: Yes    Partners: Female  Other Topics Concern   Not on file  Social History Narrative   He had 3 children prior to getting married, one died in a motorcycle accident    He has two grown children in [place] and one in [place] here from [place] after retirement    Social Determinants of Radio broadcast assistant Strain: Low Risk    Difficulty of Paying Living Expenses: Not hard at all  Food Insecurity: No Food Insecurity   Worried About Charity fundraiser in the Last Year: Never true   Arboriculturist in the Last Year: Never true  Transportation Needs: No Transportation Needs   Lack of Transportation (Medical): No   Lack of Transportation (Non-Medical): No  Physical Activity: Inactive   Days of Exercise per Week: 0 days   Minutes of Exercise per Session: 0 min  Stress: No Stress Concern Present   Feeling of Stress : Only a little  Social Connections: Moderately Integrated   Frequency of Communication with Friends and Family: Three times a week   Frequency of Social Gatherings with Friends and  Family: More than three times a week   Attends Religious Services: More than 4 times per year   Active Member of [employer] or Organizations: No   Attends Music therapist: Never   Marital Status: Married    Tobacco Counseling Counseling given: Not Answered   Clinical Intake:  Pre-visit preparation completed: Yes  Pain : No/denies pain     Nutritional Risks: None Diabetes: No  How often do you need to have someone help you when you read instructions, pamphlets, or other written materials from your doctor or pharmacy?: 1 - Never    Interpreter Needed?: No  Information entered by :: Clemetine Marker LPN  Activities of Daily Living In your present state of health, do you have any difficulty performing the following activities: 06/27/2021 12/28/2020  Hearing? N N  Vision? N N  Difficulty concentrating or making decisions? N N  Walking or climbing stairs? Y Y  Dressing or bathing? N N  Doing errands, shopping? Y Y  Comment pt does not drive Facilities manager and eating ? N -  Using the Toilet? N -  In the past six months, have you accidently leaked urine? N -  Do you have problems with loss of bowel control? N -  Managing your Medications? N -  Managing your Finances? N -  Housekeeping or managing your Housekeeping? N -  Some recent data might be hidden    Patient Care Team: Steele Sizer, MD as PCP - General (Family Medicine) Erby Pian, MD as Referring Physician (Specialist) Ronnell Freshwater, MD as Referring Physician (Ophthalmology) Teodoro Spray, MD as Consulting Physician (Cardiology) Abbie Sons, MD as Consulting Physician (Urology) Clent Jacks, RN as Registered Nurse Vladimir Crofts, MD as Consulting Physician (Neurology)  Indicate any recent Medical Services you may have received from other than [facility] providers in the past year (date may be approximate).     Assessment:   This is a routine wellness examination for  [place].  Hearing/Vision screen Hearing Screening - Comments:: Pt denies hearing difficulty Vision Screening - Comments:: [facility] screen[facility] Dr. Michelene Heady  Dietary issues and exercise activities discussed: Current Exercise Habits: The patient does not participate in regular exercise at present, Exercise limited by: respiratory conditions(s)   Goals Addressed             This Visit's Progress    Exercise   Not on track    Pt to start back exercising/walking for 30 minutes 2-3 days a week.       Depression Screen PHQ 2/9 Scores 06/27/2021 12/28/2020 06/30/2020 02/16/2020 12/29/2019 06/30/2019 09/17/2018  PHQ - 2 Score 0 0 0 0 0 0 2  PHQ- 9 Score - 0 - - 0 0 3    Fall Risk Fall Risk  06/27/2021 12/28/2020 06/30/2020 02/16/2020 12/29/2019  Falls in the past year? 0 0 0 0 0  Number falls in past yr: 0 0 0 0 0  Injury with Fall? 0 0 0 0 0  Risk for fall due to : No Fall Risks - - Impaired balance/gait -  Follow up Falls prevention discussed - - Falls prevention discussed -    FALL RISK PREVENTION PERTAINING TO THE HOME:  Any stairs in or around the home? Yes  If so, are there any without handrails? No  Home free of loose throw rugs in walkways, pet beds, electrical cords, etc? Yes  Adequate lighting in your home to reduce risk of falls? Yes   ASSISTIVE DEVICES UTILIZED TO PREVENT FALLS:  Life alert? No  Use of a cane, walker or w/c? Yes  Grab bars in the bathroom? Yes  Shower chair or bench in shower? No  Elevated toilet seat or a handicapped toilet? No   TIMED UP AND GO:  Was the test performed? No .  Telephonic visit.   Cognitive Function: Cognitive status assessed by direct observation. Patient has current diagnosis of cognitive impairment. Patient is followed by neurology for ongoing assessment.       6CIT Screen 07/09/2018 12/17/2016  What Year? 4 points 0 points  What month? 3 points 0 points  What time? 0 points 0 points  Count back  from 20 0 points 0 points  Months in reverse 4 points 4 points  Repeat phrase 10 points 2 points  Total Score 21 6    Immunizations Immunization History  Administered Date(s) Administered   Fluad Quad(high Dose 65+) 06/30/2019, 06/30/2020   Influenza, High Dose Seasonal PF 06/14/2015, 06/14/2015, 06/15/2016, 06/19/2017, 06/17/2018   Influenza-Unspecified 06/19/2017   PFIZER(Purple Top)SARS-COV-2 Vaccination 11/25/2019, 12/16/2019, 07/17/2020   Pneumococcal Conjugate-13 12/01/2013   Tdap 06/05/2011   Zoster Recombinat (Shingrix) 12/08/2020, 04/04/2021    TDAP status: Due, Education has been provided regarding the importance of this vaccine. Advised may receive this vaccine at local pharmacy or Health Dept. Aware to provide a copy of the vaccination record if obtained from local pharmacy or Health Dept. Verbalized acceptance and understanding.  Flu Vaccine status: Due, Education has been provided regarding the importance of this vaccine. Advised may receive this vaccine at local pharmacy or Health Dept. Aware to provide a copy of the vaccination record if obtained from local pharmacy or Health Dept. Verbalized acceptance and understanding.  Pneumococcal vaccine status: Up to date  Covid-19 vaccine status: Completed vaccines  Qualifies for Shingles Vaccine? Yes   Zostavax completed Yes   Shingrix Completed?: Yes  Screening Tests Health Maintenance  Topic Date Due   COVID-19 Vaccine (4 - Booster for Pfizer series) 10/09/2020   INFLUENZA VACCINE  05/08/2021   TETANUS/TDAP  07/08/2022 (Originally 06/04/2021)   Zoster Vaccines- Shingrix  Completed   HPV VACCINES  Aged Out    Health Maintenance  Health Maintenance Due  Topic Date Due   COVID-19 Vaccine (4 - Booster for Pfizer series) 10/09/2020   INFLUENZA VACCINE  05/08/2021    Colorectal cancer screening: No longer required.   Lung Cancer Screening: (Low Dose CT Chest recommended if Age 36-80 years, 30 pack-year currently  smoking OR have quit w/in 15years.) does not qualify.   Additional Screening:  Hepatitis C Screening: does not qualify.  Vision Screening: Recommended annual ophthalmology exams for early detection of glaucoma and other disorders of the eye. Is the patient up to date with their annual eye exam?  Yes  Who is the provider or what is the name of the office in which the patient attends annual eye exams? [facility].   Dental Screening: Recommended annual dental exams for proper oral hygiene  Community Resource Referral / Chronic Care Management: CRR required this visit?  No   CCM required this visit?  No      Plan:     I have personally reviewed and noted the following in the patient's chart:   Medical and social history Use of alcohol, tobacco or illicit drugs  Current medications and supplements including opioid prescriptions. Patient is not currently taking opioid prescriptions. Functional ability and status Nutritional status Physical activity Advanced directives List of other physicians Hospitalizations, surgeries, and ER visits in previous 12 months Vitals Screenings to include cognitive, depression, and falls Referrals and appointments  In addition, I have reviewed and discussed with patient certain preventive protocols, quality metrics, and best practice recommendations. A written personalized care plan for preventive services as well as general preventive health recommendations were provided to patient.     Clemetine Marker, LPN   6/60/6301   Nurse Notes: none

## 2021-07-04 ENCOUNTER — Other Ambulatory Visit: Payer: Self-pay

## 2021-07-04 ENCOUNTER — Encounter: Payer: Self-pay | Admitting: Family Medicine

## 2021-07-04 ENCOUNTER — Ambulatory Visit (INDEPENDENT_AMBULATORY_CARE_PROVIDER_SITE_OTHER): Payer: Medicare HMO | Admitting: Family Medicine

## 2021-07-04 VITALS — BP 128/86 | HR 66 | Temp 98.3°F | Resp 16 | Ht 69.0 in | Wt 181.0 lb

## 2021-07-04 DIAGNOSIS — E785 Hyperlipidemia, unspecified: Secondary | ICD-10-CM | POA: Diagnosis not present

## 2021-07-04 DIAGNOSIS — F323 Major depressive disorder, single episode, severe with psychotic features: Secondary | ICD-10-CM

## 2021-07-04 DIAGNOSIS — E441 Mild protein-calorie malnutrition: Secondary | ICD-10-CM

## 2021-07-04 DIAGNOSIS — F0151 Vascular dementia with behavioral disturbance: Secondary | ICD-10-CM

## 2021-07-04 DIAGNOSIS — Z79899 Other long term (current) drug therapy: Secondary | ICD-10-CM

## 2021-07-04 DIAGNOSIS — J432 Centrilobular emphysema: Secondary | ICD-10-CM

## 2021-07-04 DIAGNOSIS — Z23 Encounter for immunization: Secondary | ICD-10-CM

## 2021-07-04 DIAGNOSIS — R21 Rash and other nonspecific skin eruption: Secondary | ICD-10-CM

## 2021-07-04 DIAGNOSIS — N400 Enlarged prostate without lower urinary tract symptoms: Secondary | ICD-10-CM

## 2021-07-04 DIAGNOSIS — D638 Anemia in other chronic diseases classified elsewhere: Secondary | ICD-10-CM

## 2021-07-04 DIAGNOSIS — Z8679 Personal history of other diseases of the circulatory system: Secondary | ICD-10-CM

## 2021-07-04 DIAGNOSIS — E559 Vitamin D deficiency, unspecified: Secondary | ICD-10-CM

## 2021-07-04 DIAGNOSIS — F01518 Vascular dementia, unspecified severity, with other behavioral disturbance: Secondary | ICD-10-CM

## 2021-07-04 DIAGNOSIS — J449 Chronic obstructive pulmonary disease, unspecified: Secondary | ICD-10-CM

## 2021-07-04 DIAGNOSIS — T148XXA Other injury of unspecified body region, initial encounter: Secondary | ICD-10-CM

## 2021-07-04 DIAGNOSIS — G4709 Other insomnia: Secondary | ICD-10-CM

## 2021-07-04 MED ORDER — CITALOPRAM HYDROBROMIDE 10 MG PO TABS: 10.0000 mg | ORAL_TABLET | Freq: Every day | ORAL | 1 refills | Status: DC

## 2021-07-04 MED ORDER — TRIAMCINOLONE ACETONIDE 0.1 % EX CREA: 1.0000 "application " | TOPICAL_CREAM | Freq: Two times a day (BID) | CUTANEOUS | 0 refills | Status: DC

## 2021-07-04 NOTE — Progress Notes (Signed)
Name: Eugene Murphy   MRN: [id number]    DOB: July 23, 1932   Date:07/04/2021       Progress Note  Subjective  Chief Complaint  Follow Up  HPI  Dementia  with behavior changes/vascular dementia/depression with psychotic features: : his health went down around Summer 2019  he started going to multiple doctors. Had abrupt onset of weight loss   He initially had nausea, weight loss, fatigue. Wife noticed a change in behavior feeling down, unable to sleep,  pacing all the time, and auditory  hallucinations.   We started him on Citalopram and  Dr. Manuella Ghazi added Seroquel and is now doing well, memory has improved, no longer staying up during the night, no longer having hallucinations, wife said doing much better than he was. Wife states he has been eating because he feels anxious/nervous, we will try going down on Celexa to 10 mg to see if it will improve symptoms    BPH: he sees Dr. Bernardo Heater, he states nocturia has improved, usually once per night.Unchanged    History of HTN: bp is at goal, he is off medication, lost a lot of weight in the past year ,  no chest pain or palpitation, he denies dizziness.    Malnutrition:  He had lost 75 lbs in a period of 2 years, he states appetite is good, eating all meals and still takes protein shakes in am's. His weight has  been stable over the last year gained another 3 lbs since last visit He is on remeron   Hyperlipidemia: taking medication and denies side effects, no chest pain or palpitation  Last LDL was down to 43, HDL also at goal at 58 . We will recheck labs    Anemia of chronic diease: no longer seeing Dr. Mike Gip, last levels stable, iron storage normal , we will recheck labs today    OSA: he has lost 75  lbs in a period of 2  Years and is no longer snoring and Dr. Raul Del has discontinued his CPAP machine .His weight has been stable over the past year   Asthma mild intermittent/COPD/Emphysema : seeing Dr. 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 Only using albuterol prn, takes singulair for asthma and allergies, also on Flonase prn   Balance problems: he was advised by neurologist to use a cane when out of the house , he denies falls.   Rash on left ankle, going on for about two months, itchy and gets red, wife asked him to change socks and has been applying lotion and has improved, he scratches and has some excoriation   Patient Active Problem List   Diagnosis Date Noted   Mild protein-calorie malnutrition ([place]) 12/28/2020   Major depression with psychotic features ([place]) 12/28/2020   Dementia with behavioral disturbance ([place]) 09/12/2018   Abnormal computed tomography of large intestine    Benign neoplasm of ascending colon    Polyp of sigmoid colon    Lesion of liver 06/17/2018   OSA on CPAP 12/31/2017   GERD (gastroesophageal reflux disease) 07/25/2016   Environmental and seasonal allergies 02/29/2016   Elevated PSA 11/02/2015   Hyperlipidemia 04/13/2015   BPH with obstruction/lower urinary tract symptoms 04/06/2015   Hypersomnia with sleep apnea 12/21/2013   COPD with asthma ([place]) 12/21/2013    Past Surgical History:  Procedure Laterality Date   COLONOSCOPY WITH PROPOFOL N/A 06/19/2018   Procedure:  COLONOSCOPY WITH PROPOFOL WITH BIOPSIES;  Surgeon: [name], MD;  Location: [place];  Service: Endoscopy;  Laterality: N/A;  sleep apnea   COSMETIC SURGERY Left    Left Arm Plastic Surgery in 1970s    Family History  Problem Relation Age of Onset   Congestive Heart Failure Father    Healthy Sister    Dementia Brother     Social History   Tobacco Use   Smoking status: Never   Smokeless tobacco: Never  Substance Use Topics   Alcohol use: Not Currently     Current Outpatient Medications:    Albuterol Sulfate 108 (90 Base) MCG/ACT AEPB, Inhale 2 puffs into the lungs as needed. , Disp: , Rfl:    Ascorbic Acid  (VITAMIN C) 1000 MG tablet, Take 500 mg by mouth daily. , Disp: , Rfl:    aspirin EC 81 MG tablet, Take 81 mg by mouth daily. Swallow whole., Disp: , Rfl:    Cholecalciferol (VITAMIN D-3) 5000 units TABS, Take 1 tablet by mouth daily. , Disp: , Rfl:    citalopram (CELEXA) 20 MG tablet, Take 1 tablet (20 mg total) by mouth daily., Disp: 90 tablet, Rfl: 1   doxazosin (CARDURA) 8 MG tablet, Take 1 tablet (8 mg total) by mouth daily., Disp: 90 tablet, Rfl: 3   dutasteride (AVODART) 0.5 MG capsule, Take 1 capsule (0.5 mg total) by mouth daily., Disp: 90 capsule, Rfl: 3   fluticasone (FLONASE) 50 MCG/ACT nasal spray, Place 2 sprays into both nostrils daily., Disp: , Rfl:    lansoprazole (PREVACID) 30 MG capsule, Take 1 capsule (30 mg total) by mouth daily., Disp: 90 capsule, Rfl: 1   levocetirizine (XYZAL) 5 MG tablet, Take 1 tablet (5 mg total) by mouth every evening., Disp: 90 tablet, Rfl: 1   mirtazapine (REMERON) 30 MG tablet, Take 1 tablet (30 mg total) by mouth at bedtime., Disp: 90 tablet, Rfl: 1   Potassium 99 MG TABS, Take 1 tablet by mouth daily at 8 pm., Disp: , Rfl:    pravastatin (PRAVACHOL) 40 MG tablet, Take 1 tablet (40 mg total) by mouth daily., Disp: 90 tablet, Rfl: 1   QUEtiapine (SEROQUEL) 25 MG tablet, Take 1 tablet (25 mg total) by mouth 2 (two) times daily., Disp: 90 tablet, Rfl: 1   ROCKLATAN 0.02-0.005 % SOLN, Apply 1 drop to eye at bedtime., Disp: , Rfl:    donepezil (ARICEPT) 5 MG tablet, Take 1 tablet by mouth daily., Disp: , Rfl:   Allergies  Allergen Reactions   Shellfish Allergy Swelling    Throat swells    I personally reviewed active problem list, medication list, allergies, family history, social history, health maintenance with the patient/caregiver today.   ROS  Constitutional: Negative for fever or weight change.  Respiratory: Negative for cough and shortness of breath.   Cardiovascular: Negative for chest pain or palpitations.  Gastrointestinal: Negative  for abdominal pain, no bowel changes.  Musculoskeletal: Negative for gait problem or joint swelling.  Skin:Positive  for rash.  Neurological: Negative for dizziness or headache.  No other specific complaints in a complete review of systems (except as listed in HPI above).   Objective  Vitals:   07/04/21 0928  BP: 128/86  Pulse: 66  Resp: 16  Temp: 98.3 F (36.8 C)  SpO2: 97%  Weight: 181 lb (82.1 kg)  Height: 5\' 9"  (1.753 m)    Body mass index is 26.73 kg/m.  Physical Exam  Constitutional: Patient appears well-developed and  well-nourished. Overweight.  No distress.  HEENT: head atraumatic, normocephalic, pupils equal and reactive to light, neck supple, Cardiovascular: Normal rate, regular rhythm and normal heart sounds.  No murmur heard. No BLE edema. Skin: erythematous rash on left ankle with some excoriation  Pulmonary/Chest: Effort normal and breath sounds normal. No respiratory distress. Abdominal: Soft.  There is no tenderness. Psychiatric: Patient has a normal mood and affect. behavior is normal. Judgment and thought content normal.   PHQ2/9: Depression screen [facility] 2/9 07/04/2021 06/27/2021 12/28/2020 06/30/2020 02/16/2020  Decreased Interest 0 0 0 0 0  Down, Depressed, Hopeless 0 0 0 0 0  PHQ - 2 Score 0 0 0 0 0  Altered sleeping - - 0 - -  Tired, decreased energy - - 0 - -  Change in appetite - - 0 - -  Feeling bad or failure about yourself  - - 0 - -  Trouble concentrating - - 0 - -  Moving slowly or fidgety/restless - - 0 - -  Suicidal thoughts - - 0 - -  PHQ-9 Score - - 0 - -  Difficult doing work/chores - - Not difficult at all - -  Some recent data might be hidden    phq 9 is negative   Fall Risk: Fall Risk  07/04/2021 06/27/2021 12/28/2020 06/30/2020 02/16/2020  Falls in the past year? 0 0 0 0 0  Number falls in past yr: 0 0 0 0 0  Injury with Fall? 0 0 0 0 0  Risk for fall due to : Impaired balance/gait No Fall Risks - - Impaired balance/gait  Follow up  Falls prevention discussed Falls prevention discussed - - Falls prevention discussed      Functional Status Survey: Is the patient deaf or have difficulty hearing?: No Does the patient have difficulty seeing, even when wearing glasses/contacts?: No Does the patient have difficulty concentrating, remembering, or making decisions?: Yes Does the patient have difficulty walking or climbing stairs?: Yes Does the patient have difficulty dressing or bathing?: No Does the patient have difficulty doing errands alone such as visiting a doctor's office or shopping?: No    Assessment & Plan  1. Vascular dementia with behavior disturbance ([place])   2. Dyslipidemia  - Lipid panel  3. Mild protein-calorie malnutrition ([place])  - CBC with Differential/Platelet  4. Centrilobular emphysema ([place])   5. Major depression with psychotic features ([place])  - citalopram (CELEXA) 10 MG tablet; Take 1 tablet (10 mg total) by mouth daily.  Dispense: 90 tablet; Refill: 1  6. Other insomnia   7. Need for immunization against influenza  - Flu Vaccine QUAD High Dose(Fluad)  8. BPH without obstruction/lower urinary tract symptoms   9. COPD with asthma ([place])   10. History of Essential hypertension   11. Anemia of chronic disease  - CBC with Differential/Platelet  12. Vitamin D deficiency  - VITAMIN D 25 Hydroxy (Vit-D Deficiency, Fractures)  13. Long-term use of high-risk medication  - COMPLETE METABOLIC PANEL WITH GFR  14. Skin excoriation  - Tdap vaccine greater than or equal to 7yo IM  15. Rash  - triamcinolone cream (KENALOG) 0.1 %; Apply 1 application topically 2 (two) times daily.  Dispense: 80 g; Refill: 0  16. Need for Tdap vaccination  - Tdap vaccine greater than or equal to 7yo IM

## 2021-07-05 LAB — CBC WITH DIFFERENTIAL/PLATELET
Absolute Monocytes: 665 cells/uL (ref 200–950)
Basophils Absolute: 40 cells/uL (ref 0–200)
Basophils Relative: 0.8 %
Eosinophils Absolute: 395 cells/uL (ref 15–500)
Eosinophils Relative: 7.9 %
HCT: 38.2 % — ABNORMAL LOW (ref 38.5–50.0)
Hemoglobin: 12.6 g/dL — ABNORMAL LOW (ref 13.2–17.1)
Lymphs Abs: 1385 cells/uL (ref 850–3900)
MCH: 30.9 pg (ref 27.0–33.0)
MCHC: 33 g/dL (ref 32.0–36.0)
MCV: 93.6 fL (ref 80.0–100.0)
MPV: 11.1 fL (ref 7.5–12.5)
Monocytes Relative: 13.3 %
Neutro Abs: 2515 cells/uL (ref 1500–7800)
Neutrophils Relative %: 50.3 %
Platelets: 197 10*3/uL (ref 140–400)
RBC: 4.08 10*6/uL — ABNORMAL LOW (ref 4.20–5.80)
RDW: 11.6 % (ref 11.0–15.0)
Total Lymphocyte: 27.7 %
WBC: 5 10*3/uL (ref 3.8–10.8)

## 2021-07-05 LAB — COMPLETE METABOLIC PANEL WITH GFR
AG Ratio: 1.7 (calc) (ref 1.0–2.5)
ALT: 24 U/L (ref 9–46)
AST: 24 U/L (ref 10–35)
Albumin: 4.3 g/dL (ref 3.6–5.1)
Alkaline phosphatase (APISO): 42 U/L (ref 35–144)
BUN: 23 mg/dL (ref 7–25)
CO2: 30 mmol/L (ref 20–32)
Calcium: 9.7 mg/dL (ref 8.6–10.3)
Chloride: 102 mmol/L (ref 98–110)
Creat: 1.14 mg/dL (ref 0.70–1.22)
Globulin: 2.5 g/dL (calc) (ref 1.9–3.7)
Glucose, Bld: 83 mg/dL (ref 65–99)
Potassium: 4.7 mmol/L (ref 3.5–5.3)
Sodium: 140 mmol/L (ref 135–146)
Total Bilirubin: 0.3 mg/dL (ref 0.2–1.2)
Total Protein: 6.8 g/dL (ref 6.1–8.1)
eGFR: 62 mL/min/{1.73_m2} (ref >=60)

## 2021-07-05 LAB — LIPID PANEL
Cholesterol: 116 mg/dL (ref <200)
HDL: 61 mg/dL (ref >=40)
LDL Cholesterol (Calc): 41 mg/dL (calc)
Non-HDL Cholesterol (Calc): 55 mg/dL (calc) (ref <130)
Total CHOL/HDL Ratio: 1.9 (calc) (ref <5.0)
Triglycerides: 48 mg/dL (ref <150)

## 2021-07-05 LAB — VITAMIN D 25 HYDROXY (VIT D DEFICIENCY, FRACTURES): Vit D, 25-Hydroxy: 93 ng/mL (ref 30–100)

## 2021-07-25 ENCOUNTER — Other Ambulatory Visit: Payer: Self-pay | Admitting: Family Medicine

## 2021-07-25 DIAGNOSIS — E785 Hyperlipidemia, unspecified: Secondary | ICD-10-CM

## 2021-07-25 MED ORDER — PRAVASTATIN SODIUM 40 MG PO TABS: 40.0000 mg | ORAL_TABLET | Freq: Every day | ORAL | 1 refills | Status: DC

## 2021-07-25 NOTE — Telephone Encounter (Signed)
Copied from [place] [id number]. Topic: Quick Communication - Rx Refill/Question >> Jul 25, 2021 11:12 AM Yvette Rack wrote: Medication: pravastatin (PRAVACHOL) 40 MG tablet  Has the patient contacted their pharmacy? Yes.   (Agent: If no, request that the patient contact the pharmacy for the refill.) (Agent: If yes, when and what did the pharmacy advise?)  Preferred Pharmacy (with phone number or street name): [facility], [place] Phone: [phone]   Fax: [phone]  Has the patient been seen for an appointment in the last year OR does the patient have an upcoming appointment? Yes.    Agent: Please be advised that RX refills may take up to 3 business days. We ask that you follow-up with your pharmacy.

## 2021-07-25 NOTE — Telephone Encounter (Signed)
Requested Prescriptions  Pending Prescriptions Disp Refills  . pravastatin (PRAVACHOL) 40 MG tablet 90 tablet 1    Sig: Take 1 tablet (40 mg total) by mouth daily.     Cardiovascular:  Antilipid - Statins Passed - 07/25/2021 12:13 PM      Passed - Total Cholesterol in normal range and within 360 days    Cholesterol, Total  Date Value Ref Range Status  10/20/2015 123 100 - 199 mg/dL Final   Cholesterol  Date Value Ref Range Status  07/04/2021 116 <200 mg/dL Final         Passed - LDL in normal range and within 360 days    LDL Cholesterol (Calc)  Date Value Ref Range Status  07/04/2021 41 mg/dL (calc) Final    Comment:    Reference range: <100 . Desirable range <100 mg/dL for primary prevention;   <70 mg/dL for patients with CHD or diabetic patients  with > or = 2 CHD risk factors. Marland Kitchen LDL-C is now calculated using the Martin-Hopkins  calculation, which is a validated novel method providing  better accuracy than the Friedewald equation in the  estimation of LDL-C.  Cresenciano Genre et al. Annamaria Helling. 6384;665(99): 2061-2068  (http://education.[facility].com/faq/FAQ164)          Passed - HDL in normal range and within 360 days    HDL  Date Value Ref Range Status  07/04/2021 61 > OR = 40 mg/dL Final  10/20/2015 48 >39 mg/dL Final         Passed - Triglycerides in normal range and within 360 days    Triglycerides  Date Value Ref Range Status  07/04/2021 48 <150 mg/dL Final         Passed - Patient is not pregnant      Passed - Valid encounter within last 12 months    Recent Outpatient Visits          3 weeks ago Vascular dementia with behavior disturbance [facility])   [facility] Steele Sizer, MD   6 months ago Major depression with psychotic features [facility])   [facility] Steele Sizer, MD   1 year ago Mild protein-calorie malnutrition [facility])   [facility] Steele Sizer, MD   1 year ago Vascular dementia with  behavior disturbance [facility])   [facility] Steele Sizer, MD   2 years ago Vascular dementia with behavior disturbance [facility])   [facility] Steele Sizer, MD      Future Appointments            In 4 months Stoioff, Ronda Fairly, MD [place]   In 5 months Steele Sizer, MD [facility], [facility]

## 2021-08-09 ENCOUNTER — Other Ambulatory Visit: Payer: Self-pay | Admitting: Family Medicine

## 2021-08-09 DIAGNOSIS — K219 Gastro-esophageal reflux disease without esophagitis: Secondary | ICD-10-CM

## 2021-08-09 DIAGNOSIS — G4709 Other insomnia: Secondary | ICD-10-CM

## 2021-08-09 MED ORDER — LANSOPRAZOLE 30 MG PO CPDR: 30.0000 mg | DELAYED_RELEASE_CAPSULE | Freq: Every day | ORAL | 1 refills | Status: DC

## 2021-08-09 MED ORDER — MIRTAZAPINE 30 MG PO TABS: 30.0000 mg | ORAL_TABLET | Freq: Every day | ORAL | 0 refills | Status: DC

## 2021-08-09 NOTE — Telephone Encounter (Signed)
Copied from [place] [id number]. Topic: Quick Communication - Rx Refill/Question >> Aug 09, 2021  9:57 AM Leward Quan A wrote: Medication: mirtazapine (REMERON) 30 MG tablet, lansoprazole (PREVACID) 30 MG capsule   Has the patient contacted their pharmacy? Yes.  Informed to call office  (Agent: If no, request that the patient contact the pharmacy for the refill. If patient does not wish to contact the pharmacy document the reason why and proceed with request.) (Agent: If yes, when and what did the pharmacy advise?)  Preferred Pharmacy (with phone number or street name): [place], [place]  Phone:  [phone] Fax:  [phone]    Has the patient been seen for an appointment in the last year OR does the patient have an upcoming appointment? Yes.    Agent: Please be advised that RX refills may take up to 3 business days. We ask that you follow-up with your pharmacy.

## 2021-08-09 NOTE — Telephone Encounter (Signed)
Requested Prescriptions  Pending Prescriptions Disp Refills  . lansoprazole (PREVACID) 30 MG capsule 90 capsule 1    Sig: Take 1 capsule (30 mg total) by mouth daily.     Gastroenterology: Proton Pump Inhibitors Passed - 08/09/2021  3:34 PM      Passed - Valid encounter within last 12 months    Recent Outpatient Visits          1 month ago Vascular dementia with behavior disturbance [facility])   [facility] Steele Sizer, MD   7 months ago Major depression with psychotic features [facility])   [facility] Steele Sizer, MD   1 year ago Mild protein-calorie malnutrition [facility])   [facility] Steele Sizer, MD   1 year ago Vascular dementia with behavior disturbance [facility])   [facility] Steele Sizer, MD   2 years ago Vascular dementia with behavior [facility])   [facility] Steele Sizer, MD      Future Appointments            In 4 months Stoioff, Ronda Fairly, MD [place]   In 4 months Steele Sizer, MD [facility]           . mirtazapine (REMERON) 30 MG tablet 90 tablet 0    Sig: Take 1 tablet (30 mg total) by mouth at bedtime.     Psychiatry: Antidepressants - mirtazapine Passed - 08/09/2021  3:34 PM      Passed - AST in normal range and within 360 days    AST  Date Value Ref Range Status  07/04/2021 24 10 - 35 U/L Final         Passed - ALT in normal range and within 360 days    ALT  Date Value Ref Range Status  07/04/2021 24 9 - 46 U/L Final         Passed - Triglycerides in normal range and within 360 days    Triglycerides  Date Value Ref Range Status  07/04/2021 48 <150 mg/dL Final         Passed - Total Cholesterol in normal range and within 360 days    Cholesterol, Total  Date Value Ref Range Status  10/20/2015 123 100 - 199 mg/dL Final   Cholesterol  Date Value Ref Range Status  07/04/2021 116 <200 mg/dL  Final         Passed - WBC in normal range and within 360 days    WBC  Date Value Ref Range Status  07/04/2021 5.0 3.8 - 10.8 Thousand/uL Final         Passed - Completed PHQ-2 or PHQ-9 in the last 360 days      Passed - Valid encounter within last 6 months    Recent Outpatient Visits          1 month ago Vascular dementia with behavior disturbance [facility])   [facility] Steele Sizer, MD   7 months ago Major depression with psychotic features [facility])   [facility] Steele Sizer, MD   1 year ago Mild protein-calorie malnutrition [facility])   [facility] Steele Sizer, MD   1 year ago Vascular dementia with behavior disturbance [facility])   [facility] Sizer, MD   2 years ago Vascular dementia with behavior disturbance [facility])   [facility] Steele Sizer, MD      Future Appointments  In 4 months Stoioff, Ronda Fairly, MD [place]   In 4 months Steele Sizer, MD [facility], [facility]

## 2021-09-12 ENCOUNTER — Other Ambulatory Visit: Payer: Self-pay | Admitting: Family Medicine

## 2021-09-12 DIAGNOSIS — F01518 Vascular dementia, unspecified severity, with other behavioral disturbance: Secondary | ICD-10-CM

## 2021-10-24 ENCOUNTER — Other Ambulatory Visit: Payer: Self-pay | Admitting: Family Medicine

## 2021-10-24 DIAGNOSIS — G4709 Other insomnia: Secondary | ICD-10-CM

## 2021-10-24 NOTE — Telephone Encounter (Signed)
Requested Prescriptions  Pending Prescriptions Disp Refills   mirtazapine (REMERON) 30 MG tablet [Pharmacy Med Name: MIRTAZAPINE TABS 30MG ] 90 tablet 3    Sig: TAKE 1 TABLET AT BEDTIME     Psychiatry: Antidepressants - mirtazapine Passed - 10/24/2021  6:08 PM      Passed - AST in normal range and within 360 days    AST  Date Value Ref Range Status  07/04/2021 24 10 - 35 U/L Final         Passed - ALT in normal range and within 360 days    ALT  Date Value Ref Range Status  07/04/2021 24 9 - 46 U/L Final         Passed - Triglycerides in normal range and within 360 days    Triglycerides  Date Value Ref Range Status  07/04/2021 48 <150 mg/dL Final         Passed - Total Cholesterol in normal range and within 360 days    Cholesterol, Total  Date Value Ref Range Status  10/20/2015 123 100 - 199 mg/dL Final   Cholesterol  Date Value Ref Range Status  07/04/2021 116 <200 mg/dL Final         Passed - WBC in normal range and within 360 days    WBC  Date Value Ref Range Status  07/04/2021 5.0 3.8 - 10.8 Thousand/uL Final         Passed - Completed PHQ-2 or PHQ-9 in the last 360 days      Passed - Valid encounter within last 6 months    Recent Outpatient Visits          3 months ago Vascular dementia with behavior disturbance [facility])   [facility] Steele Sizer, MD   10 months ago Major depression with psychotic features [facility])   [facility] Steele Sizer, MD   1 year ago Mild protein-calorie malnutrition [facility])   [facility] Sizer, MD   1 year ago Vascular dementia with behavior disturbance [facility])   [facility] Sizer, MD   2 years ago Vascular dementia with behavior disturbance [facility])   [facility] Steele Sizer, MD      Future Appointments            In 1 month Stoioff, Ronda Fairly, MD [place]   In 2 months Steele Sizer, MD  [facility], [facility]

## 2021-11-28 ENCOUNTER — Other Ambulatory Visit: Payer: Self-pay | Admitting: Urology

## 2021-11-28 DIAGNOSIS — N401 Enlarged prostate with lower urinary tract symptoms: Secondary | ICD-10-CM

## 2021-11-28 DIAGNOSIS — N138 Other obstructive and reflux uropathy: Secondary | ICD-10-CM

## 2021-11-29 ENCOUNTER — Other Ambulatory Visit: Payer: Self-pay

## 2021-11-29 DIAGNOSIS — F323 Major depressive disorder, single episode, severe with psychotic features: Secondary | ICD-10-CM

## 2021-11-29 MED ORDER — CITALOPRAM HYDROBROMIDE 10 MG PO TABS: 10.0000 mg | ORAL_TABLET | Freq: Every day | ORAL | 0 refills | Status: DC

## 2021-12-13 ENCOUNTER — Other Ambulatory Visit: Payer: Self-pay

## 2021-12-13 ENCOUNTER — Ambulatory Visit: Payer: Medicare HMO | Admitting: Urology

## 2021-12-13 ENCOUNTER — Encounter: Payer: Self-pay | Admitting: Urology

## 2021-12-13 VITALS — BP 134/82 | HR 83 | Ht 69.0 in | Wt 186.0 lb

## 2021-12-13 DIAGNOSIS — N401 Enlarged prostate with lower urinary tract symptoms: Secondary | ICD-10-CM

## 2021-12-13 DIAGNOSIS — N138 Other obstructive and reflux uropathy: Secondary | ICD-10-CM

## 2021-12-13 LAB — URINALYSIS, COMPLETE
Bilirubin, UA: NEGATIVE
Glucose, UA: NEGATIVE
Leukocytes,UA: NEGATIVE
Nitrite, UA: NEGATIVE
Protein,UA: NEGATIVE
RBC, UA: NEGATIVE
Specific Gravity, UA: >1.03 — ABNORMAL HIGH (ref 1.005–1.030)
Urobilinogen, Ur: 0.2 mg/dL (ref 0.2–1.0)
pH, UA: 5.5 (ref 5.0–7.5)

## 2021-12-13 LAB — MICROSCOPIC EXAMINATION
Bacteria, UA: NONE SEEN
Epithelial Cells (non renal): NONE SEEN /hpf (ref 0–10)
RBC, Urine: NONE SEEN /hpf (ref 0–2)

## 2021-12-13 LAB — BLADDER SCAN AMB NON-IMAGING: Scan Result: 0

## 2021-12-13 NOTE — Progress Notes (Unsigned)
12/13/2021 1:11 PM   Eugene Murphy [birth year] [id number]  Referring provider: Steele Sizer, MD [address] [place],  [place] [address]  Chief Complaint  Patient presents with   Benign Prostatic Hypertrophy    HPI: 88 y.o. male presents for annual follow-up BPH.  No significant changes since last years visit Remains on dutasteride 1 tab weekly and doxazosin 8 mg daily No bothersome LUTS Denies dysuria, gross hematuria Denies flank, abdominal or pelvic pain  PMH: Past Medical History:  Diagnosis Date   Arthritis    lower back   Asthma    BPH (benign prostatic hyperplasia)    Followed by Bonna Gains.   GERD (gastroesophageal reflux disease)    Glaucoma    Hyperlipidemia    Hypertension    Sleep apnea    CPAP    Surgical History: Past Surgical History:  Procedure Laterality Date   COLONOSCOPY WITH PROPOFOL N/A 06/19/2018   Procedure: COLONOSCOPY WITH PROPOFOL WITH BIOPSIES;  Surgeon: [name], MD;  Location: [facility];  Service: Endoscopy;  Laterality: N/A;  sleep apnea   COSMETIC SURGERY Left    Left Arm Plastic Surgery in 1970s    Home Medications:  Allergies as of 12/13/2021       Reactions   Shellfish Allergy Swelling   Throat swells        Medication List        Accurate as of December 13, 2021  1:11 PM. If you have any questions, ask your nurse or doctor.          Albuterol Sulfate 108 (90 Base) MCG/ACT Aepb Commonly known as: PROAIR RESPICLICK Inhale 2 puffs into the lungs as needed.   aspirin EC 81 MG tablet Take 81 mg by mouth daily. Swallow whole.   citalopram 10 MG tablet Commonly known as: CELEXA Take 1 tablet (10 mg total) by mouth daily.   donepezil 5 MG tablet Commonly known as: ARICEPT Take 1 tablet by mouth daily.   doxazosin 8 MG tablet Commonly known as: CARDURA TAKE 1 TABLET DAILY   dutasteride 0.5 MG capsule Commonly known as: Avodart Take 1 capsule (0.5 mg total) by mouth daily.    fluticasone 50 MCG/ACT nasal spray Commonly known as: FLONASE Place 2 sprays into both nostrils daily.   lansoprazole 30 MG capsule Commonly known as: PREVACID Take 1 capsule (30 mg total) by mouth daily.   levocetirizine 5 MG tablet Commonly known as: XYZAL Take 1 tablet (5 mg total) by mouth every evening.   mirtazapine 30 MG tablet Commonly known as: REMERON TAKE 1 TABLET AT BEDTIME   Potassium 99 MG Tabs Take 1 tablet by mouth daily at 8 pm.   pravastatin 40 MG tablet Commonly known as: PRAVACHOL Take 1 tablet (40 mg total) by mouth daily.   QUEtiapine 25 MG tablet Commonly known as: SEROQUEL TAKE 1 TABLET TWICE A DAY   Rocklatan 0.02-0.005 % Soln Generic drug: Netarsudil-Latanoprost Apply 1 drop to eye at bedtime.   triamcinolone cream 0.1 % Commonly known as: KENALOG Apply 1 application topically 2 (two) times daily.   vitamin C 1000 MG tablet Take 500 mg by mouth daily.   Vitamin D-3 125 MCG (5000 UT) Tabs Take 1 tablet by mouth daily.        Allergies:  Allergies  Allergen Reactions   Shellfish Allergy Swelling    Throat swells    Family History: Family History  Problem Relation Age of Onset   Congestive Heart Failure  Father    Healthy Sister    Dementia Brother     Social History:  reports that he has never smoked. He has never used smokeless tobacco. He reports that he does not currently use alcohol. He reports that he does not use drugs.   Physical Exam: There were no vitals taken for this visit.  Constitutional:  Alert and oriented, No acute distress. HEENT: [place] AT, moist mucus membranes.  Trachea midline, no masses. Cardiovascular: No clubbing, cyanosis, or edema. Respiratory: Normal respiratory effort, no increased work of breathing.  Laboratory Data:  Urinalysis Dipstick/microscopy negative   Assessment & Plan:    1. BPH with obstruction/lower urinary tract symptoms Stable LUTS on doxazosin/dutasteride Doxazosin refilled  ***check refills Bladder scan PVR 0 mL Continue annual follow-up   Abbie Sons, MD  [place] [address] [place], [place] [address] [phone]

## 2022-01-01 NOTE — Progress Notes (Signed)
Name: Eugene Murphy   MRN: [id number]    DOB: 05/20/1932   Date:01/02/2022 ? ?     Progress Note ? ?Subjective ? ?Chief Complaint ? ?Follow Up ? ?HPI ? ?Dementia  with behavior changes/vascular dementia/depression with psychotic features: : his health went down around Summer 2019  he started going to multiple doctors. Had abrupt onset of weight loss   He initially had nausea, weight loss, fatigue. Wife had noticed a change in behavior feeling down, unable to sleep,  pacing all the time, and auditory  hallucinations.   We started him on Citalopram and  Dr. Manuella Ghazi added Seroquel and is now doing well, memory has improved, no longer staying up during the night, no longer having hallucinations, but he is always asking what she is doing, she has to be around him all the time. He refuses to have home health at the house or go to the senior center , explained that his wife needs some time for herself but he refuses it. Currently off Remeron but sill on Aricept 5 mg and now on seroquel prn  ?  ?BPH: he sees Dr. Bernardo Heater, he states nocturia has improved, usually once per night Stable   ?  ?History of HTN: bp is at goal, he is off medication, lost a lot of weight in the past year, no chest pain or palpitation, he denies dizziness. Stable  ?  ?Malnutrition:  He had lost 75 lbs in a period of 2 years, he states appetite is good, eating all meals and still takes protein shakes in am's. His weight went up a few pounds since last visit.  ? ?Hyperlipidemia: taking medication and denies side effects, no chest pain or palpitation  Last LDL was down to 43, HDL also at goal at 58 . We will recheck labs  ?  ?Anemia of chronic diease: no longer seeing Dr. Mike Gip, last levels stable, iron storage normal ?  ?OSA: he had lost 75  lbs in a period of 2  Years and is no longer snoring and Dr. Raul Del has discontinued his CPAP machine .His weight has improved  ? ?Asthma mild intermittent/COPD/Emphysema : seeing Dr. 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 Only using albuterol prn, takes singulair for asthma and allergies, also on Flonase prn Unchanged  ? ? ?Patient Active Problem List  ? Diagnosis Date Noted  ? Mild protein-calorie malnutrition ([place]) 12/28/2020  ? Major depression with psychotic features ([place]) 12/28/2020  ? Dementia with behavioral disturbance 09/12/2018  ? Abnormal computed tomography of large intestine   ? Benign neoplasm of ascending colon   ? Polyp of sigmoid colon   ? Lesion of liver 06/17/2018  ? OSA on CPAP 12/31/2017  ? GERD (gastroesophageal reflux disease) 07/25/2016  ? Environmental and seasonal allergies 02/29/2016  ? Elevated PSA 11/02/2015  ? Hyperlipidemia 04/13/2015  ? BPH with obstruction/lower urinary tract symptoms 04/06/2015  ? Hypersomnia with sleep apnea 12/21/2013  ? COPD with asthma ([place]) 12/21/2013  ? ? ?Past Surgical History:  ?Procedure Laterality Date  ? COLONOSCOPY WITH PROPOFOL N/A 06/19/2018  ? Procedure: COLONOSCOPY WITH PROPOFOL WITH BIOPSIES;  Surgeon: [name], MD;  Location: [facility];  Service: Endoscopy;  Laterality: N/A;  sleep apnea  ? COSMETIC SURGERY Left   ? Left Arm Plastic Surgery in 1970s  ? ? ?Family History  ?Problem Relation Age of Onset  ? Congestive  Heart Failure Father   ? Healthy Sister   ? Dementia Brother   ? ? ?Social History  ? ?Tobacco Use  ? Smoking status: Never  ? Smokeless tobacco: Never  ?Substance Use Topics  ? Alcohol use: Not Currently  ? ? ? ?Current Outpatient Medications:  ?  Albuterol Sulfate 108 (90 Base) MCG/ACT AEPB, Inhale 2 puffs into the lungs as needed. , Disp: , Rfl:  ?  Ascorbic Acid (VITAMIN C) 1000 MG tablet, Take 500 mg by mouth daily. , Disp: , Rfl:  ?  aspirin EC 81 MG tablet, Take 81 mg by mouth daily. Swallow whole., Disp: , Rfl:  ?  Cholecalciferol (VITAMIN D-3) 5000 units TABS, Take 1 tablet by mouth daily. , Disp: , Rfl:  ?  citalopram (CELEXA) 10 MG  tablet, Take 1 tablet (10 mg total) by mouth daily., Disp: 90 tablet, Rfl: 0 ?  doxazosin (CARDURA) 8 MG tablet, TAKE 1 TABLET DAILY, Disp: 90 tablet, Rfl: 3 ?  dutasteride (AVODART) 0.5 MG capsule, Take 1 capsule (0.5 mg total) by mouth daily., Disp: 90 capsule, Rfl: 3 ?  fluticasone (FLONASE) 50 MCG/ACT nasal spray, Place 2 sprays into both nostrils daily., Disp: , Rfl:  ?  lansoprazole (PREVACID) 30 MG capsule, Take 1 capsule (30 mg total) by mouth daily., Disp: 90 capsule, Rfl: 1 ?  levocetirizine (XYZAL) 5 MG tablet, Take 1 tablet (5 mg total) by mouth every evening., Disp: 90 tablet, Rfl: 1 ?  mirtazapine (REMERON) 30 MG tablet, TAKE 1 TABLET AT BEDTIME, Disp: 90 tablet, Rfl: 0 ?  Potassium 99 MG TABS, Take 1 tablet by mouth daily at 8 pm., Disp: , Rfl:  ?  pravastatin (PRAVACHOL) 40 MG tablet, Take 1 tablet (40 mg total) by mouth daily., Disp: 90 tablet, Rfl: 1 ?  QUEtiapine (SEROQUEL) 25 MG tablet, TAKE 1 TABLET TWICE A DAY, Disp: 90 tablet, Rfl: 0 ?  ROCKLATAN 0.02-0.005 % SOLN, Apply 1 drop to eye at bedtime., Disp: , Rfl:  ?  triamcinolone cream (KENALOG) 0.1 %, Apply 1 application topically 2 (two) times daily., Disp: 80 g, Rfl: 0 ?  donepezil (ARICEPT) 5 MG tablet, Take 1 tablet by mouth daily., Disp: , Rfl:  ? ?Allergies  ?Allergen Reactions  ? Shellfish Allergy Swelling  ?  Throat swells  ? ? ?I personally reviewed active problem list, medication list, allergies, family history, social history, health maintenance with the patient/caregiver today. ? ? ?ROS ? ?Constitutional: Negative for fever or weight change.  ?Respiratory: Negative for cough and shortness of breath.   ?Cardiovascular: Negative for chest pain or palpitations.  ?Gastrointestinal: Negative for abdominal pain, no bowel changes.  ?Musculoskeletal: Negative for gait problem or joint swelling.  ?Skin: Negative for rash.  ?Neurological: Negative for dizziness or headache.  ?No other specific complaints in a complete review of systems  (except as listed in HPI above).  ? ?Objective ? ?Vitals:  ? 01/02/22 0932  ?BP: 130/68  ?Pulse: 77  ?Resp: 16  ?SpO2: 97%  ?Weight: 185 lb (83.9 kg)  ?Height: '5\' 8"'$  (1.727 m)  ? ? ?Body mass index is 28.13 kg/m?. ? ?Physical Exam ? ?Constitutional: Patient appears well-developed  No distress.  ?HEENT: head atraumatic, normocephalic, pupils equal and reactive to light,neck supple ?Cardiovascular: Normal rate, regular rhythm and normal heart sounds.  No murmur heard. No BLE edema. ?Pulmonary/Chest: Effort normal and breath sounds normal. No respiratory distress. ?Abdominal: Soft.  There is no tenderness. ?Psychiatric: Patient has a normal mood and  affect. behavior is normal. Judgment and thought content normal.  ? ?Recent Results (from the past 2160 hour(s))  ?Urinalysis, Complete     Status: Abnormal  ? Collection Time: 12/13/21  1:04 PM  ?Result Value Ref Range  ? Specific Gravity, UA >1.030 (H) 1.005 - 1.030  ? pH, UA 5.5 5.0 - 7.5  ? Color, UA Yellow Yellow  ? Appearance Ur Clear Clear  ? Leukocytes,UA Negative Negative  ? Protein,UA Negative Negative/Trace  ? Glucose, UA Negative Negative  ? Ketones, UA Trace (A) Negative  ? RBC, UA Negative Negative  ? Bilirubin, UA Negative Negative  ? Urobilinogen, Ur 0.2 0.2 - 1.0 mg/dL  ? Nitrite, UA Negative Negative  ? Microscopic Examination See below:   ?Microscopic Examination     Status: None  ? Collection Time: 12/13/21  1:04 PM  ? Urine  ?Result Value Ref Range  ? WBC, UA 0-5 0 - 5 /hpf  ? RBC None seen 0 - 2 /hpf  ? Epithelial Cells (non renal) None seen 0 - 10 /hpf  ? Bacteria, UA None seen None seen/Few  ?Bladder Scan (Post Void Residual) in office     Status: None  ? Collection Time: 12/13/21  1:12 PM  ?Result Value Ref Range  ? Scan Result 0   ? ? ? ?PHQ2/9: ? ?  01/02/2022  ?  9:31 AM 07/04/2021  ?  9:28 AM 06/27/2021  ?  9:13 AM 12/28/2020  ?  8:54 AM 06/30/2020  ?  9:28 AM  ?Depression screen PHQ 2/9  ?Decreased Interest 0 0 0 0 0  ?Down, Depressed, Hopeless 0 0  0 0 0  ?PHQ - 2 Score 0 0 0 0 0  ?Altered sleeping 0   0   ?Tired, decreased energy 0   0   ?Change in appetite 0   0   ?Feeling bad or failure about yourself  0   0   ?Trouble concentrating 0   0   ?Moving sl

## 2022-01-02 ENCOUNTER — Encounter: Payer: Self-pay | Admitting: Family Medicine

## 2022-01-02 ENCOUNTER — Ambulatory Visit (INDEPENDENT_AMBULATORY_CARE_PROVIDER_SITE_OTHER): Payer: Medicare HMO | Admitting: Family Medicine

## 2022-01-02 VITALS — BP 130/68 | HR 77 | Resp 16 | Ht 68.0 in | Wt 185.0 lb

## 2022-01-02 DIAGNOSIS — N400 Enlarged prostate without lower urinary tract symptoms: Secondary | ICD-10-CM | POA: Diagnosis not present

## 2022-01-02 DIAGNOSIS — D638 Anemia in other chronic diseases classified elsewhere: Secondary | ICD-10-CM

## 2022-01-02 DIAGNOSIS — J432 Centrilobular emphysema: Secondary | ICD-10-CM | POA: Diagnosis not present

## 2022-01-02 DIAGNOSIS — G4709 Other insomnia: Secondary | ICD-10-CM

## 2022-01-02 DIAGNOSIS — F01518 Vascular dementia, unspecified severity, with other behavioral disturbance: Secondary | ICD-10-CM | POA: Diagnosis not present

## 2022-01-02 DIAGNOSIS — F323 Major depressive disorder, single episode, severe with psychotic features: Secondary | ICD-10-CM | POA: Diagnosis not present

## 2022-01-02 DIAGNOSIS — E785 Hyperlipidemia, unspecified: Secondary | ICD-10-CM

## 2022-01-02 DIAGNOSIS — E559 Vitamin D deficiency, unspecified: Secondary | ICD-10-CM

## 2022-01-02 DIAGNOSIS — Z79899 Other long term (current) drug therapy: Secondary | ICD-10-CM

## 2022-01-02 MED ORDER — PRAVASTATIN SODIUM 40 MG PO TABS: 40.0000 mg | ORAL_TABLET | Freq: Every day | ORAL | 1 refills | Status: DC

## 2022-01-02 MED ORDER — QUETIAPINE FUMARATE 25 MG PO TABS: 25.0000 mg | ORAL_TABLET | Freq: Two times a day (BID) | ORAL | 1 refills | Status: DC

## 2022-01-02 MED ORDER — CITALOPRAM HYDROBROMIDE 10 MG PO TABS: 10.0000 mg | ORAL_TABLET | Freq: Every day | ORAL | 0 refills | Status: DC

## 2022-02-06 ENCOUNTER — Other Ambulatory Visit: Payer: Self-pay | Admitting: Family Medicine

## 2022-02-06 DIAGNOSIS — K219 Gastro-esophageal reflux disease without esophagitis: Secondary | ICD-10-CM

## 2022-05-15 ENCOUNTER — Other Ambulatory Visit: Payer: Self-pay | Admitting: Family Medicine

## 2022-05-15 DIAGNOSIS — F323 Major depressive disorder, single episode, severe with psychotic features: Secondary | ICD-10-CM

## 2022-05-18 ENCOUNTER — Other Ambulatory Visit: Payer: Self-pay | Admitting: Family Medicine

## 2022-05-18 DIAGNOSIS — F01518 Vascular dementia, unspecified severity, with other behavioral disturbance: Secondary | ICD-10-CM

## 2022-05-18 MED ORDER — QUETIAPINE FUMARATE 25 MG PO TABS: 25.0000 mg | ORAL_TABLET | Freq: Two times a day (BID) | ORAL | 0 refills | Status: DC

## 2022-05-18 NOTE — Telephone Encounter (Signed)
Medication Refill - Medication: QUEtiapine (SEROQUEL) 25 MG tablet [[id number]]   Has the patient contacted their pharmacy? Yes.   (Agent: If no, request that the patient contact the pharmacy for the refill. If patient does not wish to contact the pharmacy document the reason why and proceed with request.) (Agent: If yes, when and what did the pharmacy advise?)  Preferred Pharmacy (with phone number or street name):  [facility], [place]  [place] [address]  Phone: [phone] Fax: [phone]  Hours: Not open 24 hours   Has the patient been seen for an appointment in the last year OR does the patient have an upcoming appointment? Yes.    Agent: Please be advised that RX refills may take up to 3 business days. We ask that you follow-up with your pharmacy.

## 2022-05-18 NOTE — Telephone Encounter (Signed)
Requested medication (s) are due for refill today:   Provider to review  Requested medication (s) are on the active medication list:   Yes  Future visit scheduled:   Yes   Last ordered: 01/02/2022  Returned because it's a non delegated refill    Requested Prescriptions  Pending Prescriptions Disp Refills   QUEtiapine (SEROQUEL) 25 MG tablet 180 tablet 1    Sig: Take 1 tablet (25 mg total) by mouth 2 (two) times daily.     Not Delegated - Psychiatry:  Antipsychotics - Second Generation (Atypical) - quetiapine Failed - 05/18/2022 11:24 AM      Failed - This refill cannot be delegated      Failed - TSH in normal range and within 360 days    TSH  Date Value Ref Range Status  06/15/2018 1.946 0.350 - 4.500 uIU/mL Final    Comment:    Performed by a 3rd Generation assay with a functional sensitivity of <=0.01 uIU/mL. Performed at [facility], [address] [place], [place] [address]   12/17/2016 1.33 0.40 - 4.50 mIU/L Final         Failed - Lipid Panel in normal range within the last 12 months    Cholesterol, Total  Date Value Ref Range Status  10/20/2015 123 100 - 199 mg/dL Final   Cholesterol  Date Value Ref Range Status  07/04/2021 116 <200 mg/dL Final   LDL Cholesterol (Calc)  Date Value Ref Range Status  07/04/2021 41 mg/dL (calc) Final    Comment:    Reference range: <100 . Desirable range <100 mg/dL for primary prevention;   <70 mg/dL for patients with CHD or diabetic patients  with > or = 2 CHD risk factors. Marland Kitchen LDL-C is now calculated using the Martin-Hopkins  calculation, which is a validated novel method providing  better accuracy than the Friedewald equation in the  estimation of LDL-C.  Cresenciano Genre et al. Annamaria Helling. 2951;884(16): 2061-2068  (http://education.[facility].com/faq/FAQ164)    HDL  Date Value Ref Range Status  07/04/2021 61 > OR = 40 mg/dL Final  10/20/2015 48 >39 mg/dL Final   Triglycerides  Date Value Ref Range Status   07/04/2021 48 <150 mg/dL Final         Failed - CMP within normal limits and completed in the last 12 months    Albumin  Date Value Ref Range Status  06/15/2018 4.4 3.5 - 5.0 g/dL Final  06/11/2018 4.7 3.5 - 4.7 g/dL Final   Alkaline Phosphatase  Date Value Ref Range Status  06/15/2018 34 (L) 38 - 126 U/L Final   Alkaline phosphatase (APISO)  Date Value Ref Range Status  07/04/2021 42 35 - 144 U/L Final   ALT  Date Value Ref Range Status  07/04/2021 24 9 - 46 U/L Final   AST  Date Value Ref Range Status  07/04/2021 24 10 - 35 U/L Final   BUN  Date Value Ref Range Status  07/04/2021 23 7 - 25 mg/dL Final  06/11/2018 12 8 - 27 mg/dL Final   Calcium  Date Value Ref Range Status  07/04/2021 9.7 8.6 - 10.3 mg/dL Final   CO2  Date Value Ref Range Status  07/04/2021 30 20 - 32 mmol/L Final   Creat  Date Value Ref Range Status  07/04/2021 1.14 0.70 - 1.22 mg/dL Final   Glucose, Bld  Date Value Ref Range Status  07/04/2021 83 65 - 99 mg/dL Final    Comment:    .  Fasting reference interval .    Potassium  Date Value Ref Range Status  07/04/2021 4.7 3.5 - 5.3 mmol/L Final   Sodium  Date Value Ref Range Status  07/04/2021 140 135 - 146 mmol/L Final  06/11/2018 135 134 - 144 mmol/L Final   Total Bilirubin  Date Value Ref Range Status  07/04/2021 0.3 0.2 - 1.2 mg/dL Final   Bilirubin Total  Date Value Ref Range Status  06/11/2018 0.6 0.0 - 1.2 mg/dL Final   Protein, ur  Date Value Ref Range Status  06/15/2018 NEGATIVE NEGATIVE mg/dL Final   Protein,UA  Date Value Ref Range Status  12/13/2021 Negative Negative/Trace Final   Total Protein  Date Value Ref Range Status  07/04/2021 6.8 6.1 - 8.1 g/dL Final  06/11/2018 7.1 6.0 - 8.5 g/dL Final   GFR, Est African American  Date Value Ref Range Status  06/30/2020 80 > OR = 60 mL/min/1.64m Final   eGFR  Date Value Ref Range Status  07/04/2021 62 > OR = 60 mL/min/1.715mFinal    Comment:     The eGFR is based on the CKD-EPI 2021 equation. To calculate  the new eGFR from a previous Creatinine or Cystatin C result, go to https://www.kidney.org/professionals/ kdoqi/gfr%5Fcalculator    GFR, Est Non African American  Date Value Ref Range Status  06/30/2020 69 > OR = 60 mL/min/1.7338minal         Passed - Completed PHQ-2 or PHQ-9 in the last 360 days      Passed - Last BP in normal range    BP Readings from Last 1 Encounters:  01/02/22 130/68         Passed - Last Heart Rate in normal range    Pulse Readings from Last 1 Encounters:  01/02/22 77         Passed - Valid encounter within last 6 months    Recent Outpatient Visits           4 months ago [facility]   10 months ago [facility]   1 year ago [facility] with psychotic [facility]   1 year ago Mild protein-calorie malnutrition ([facility]   2 years ago [facility]       Future Appointments             In 1 month Sowles, KriDrue StagerD [facility] normal limits and completed in the last 12 months    WBC  Date Value Ref Range Status  07/04/2021 5.0 3.8 - 10.8 Thousand/uL Final   RBC  Date Value Ref Range Status  07/04/2021 4.08 (L) 4.20 - 5.80 Million/uL Final   Hemoglobin  Date Value Ref Range Status  07/04/2021 12.6 (L) 13.2 - 17.1 g/dL Final  06/11/2018 12.5 (L) 13.0 - 17.7 g/dL Final   HCT  Date Value Ref Range Status  07/04/2021 38.2 (L) 38.5 - 50.0 % Final   Hematocrit  Date Value Ref Range Status  06/11/2018 36.2 (L) 37.5 - 51.0 % Final   MCHC  Date Value Ref  Range Status  07/04/2021 33.0 32.0 -  36.0 g/dL Final   [facility]  Date Value Ref Range Status  07/04/2021 30.9 27.0 - 33.0 pg Final   MCV  Date Value Ref Range Status  07/04/2021 93.6 80.0 - 100.0 fL Final  06/11/2018 91 79 - 97 fL Final   No results found for: "PLTCOUNTKUC", "LABPLAT", "POCPLA" RDW  Date Value Ref Range Status  07/04/2021 11.6 11.0 - 15.0 % Final  06/11/2018 12.3 12.3 - 15.4 % Final

## 2022-05-28 ENCOUNTER — Telehealth: Payer: Self-pay | Admitting: Family Medicine

## 2022-05-28 ENCOUNTER — Other Ambulatory Visit: Payer: Self-pay

## 2022-05-28 DIAGNOSIS — E785 Hyperlipidemia, unspecified: Secondary | ICD-10-CM

## 2022-05-28 DIAGNOSIS — K219 Gastro-esophageal reflux disease without esophagitis: Secondary | ICD-10-CM

## 2022-05-28 MED ORDER — LANSOPRAZOLE 30 MG PO CPDR: 30.0000 mg | DELAYED_RELEASE_CAPSULE | Freq: Every day | ORAL | 1 refills | Status: DC

## 2022-05-28 MED ORDER — PRAVASTATIN SODIUM 40 MG PO TABS: 40.0000 mg | ORAL_TABLET | Freq: Every day | ORAL | 1 refills | Status: DC

## 2022-05-28 NOTE — Telephone Encounter (Signed)
Medication Refill - Medication: lansoprazole (PREVACID) 30 MG capsule and pravastatin (PRAVACHOL) 40 MG tablet  Has the patient contacted their pharmacy? Yes.    Preferred Pharmacy (with phone number or street name):  [place], [place] Phone:  [phone]  Fax:  [phone]     Has the patient been seen for an appointment in the last year OR does the patient have an upcoming appointment? Yes.    Agent: Please be advised that RX refills may take up to 3 business days. We ask that you follow-up with your pharmacy.

## 2022-05-29 NOTE — Telephone Encounter (Signed)
Both medications refilled 05/28/22 [facility] They confirmed receipt- it just takes time to ship.

## 2022-05-29 NOTE — Telephone Encounter (Signed)
Both requested meds ordered by Dr Michelle Nasuti 05/28/22 Dr. Ancil Boozer  Requested Prescriptions  Refused Prescriptions Disp Refills  . lansoprazole (PREVACID) 30 MG capsule 90 capsule 1    Sig: Take 1 capsule (30 mg total) by mouth daily.     Gastroenterology: Proton Pump Inhibitors 2 Passed - 05/28/2022  4:01 PM      Passed - ALT in normal range and within 360 days    ALT  Date Value Ref Range Status  07/04/2021 24 9 - 46 U/L Final         Passed - AST in normal range and within 360 days    AST  Date Value Ref Range Status  07/04/2021 24 10 - 35 U/L Final         Passed - Valid encounter within last 12 months    Recent Outpatient Visits          4 months ago Centrilobular emphysema [facility])   [facility] Steele Sizer, MD   10 months ago Vascular dementia with behavior disturbance [facility])   [facility] Steele Sizer, MD   1 year ago Major depression with psychotic features [facility])   [facility] Steele Sizer, MD   1 year ago Mild protein-calorie malnutrition [facility])   [facility] Steele Sizer, MD   2 years ago Vascular dementia with behavior disturbance [facility])   [facility] Steele Sizer, MD      Future Appointments            In 1 month Ancil Boozer, Drue Stager, MD [facility] [place]           . pravastatin (PRAVACHOL) 40 MG tablet 90 tablet 1    Sig: Take 1 tablet (40 mg total) by mouth daily.     Cardiovascular:  Antilipid - Statins Failed - 05/28/2022  4:01 PM      Failed - Lipid Panel in normal range within the last 12 months    Cholesterol, Total  Date Value Ref Range Status  10/20/2015 123 100 - 199 mg/dL Final   Cholesterol  Date Value Ref Range Status  07/04/2021 116 <200 mg/dL Final   LDL Cholesterol (Calc)  Date Value Ref Range Status  07/04/2021 41 mg/dL (calc) Final    Comment:    Reference range: <100 . Desirable range <100 mg/dL for  primary prevention;   <70 mg/dL for patients with CHD or diabetic patients  with > or = 2 CHD risk factors. Marland Kitchen LDL-C is now calculated using the Martin-Hopkins  calculation, which is a validated novel method providing  better accuracy than the Friedewald equation in the  estimation of LDL-C.  Cresenciano Genre et al. Annamaria Helling. 7408;144(81): 2061-2068  (http://education.[facility].com/faq/FAQ164)    HDL  Date Value Ref Range Status  07/04/2021 61 > OR = 40 mg/dL Final  10/20/2015 48 >39 mg/dL Final   Triglycerides  Date Value Ref Range Status  07/04/2021 48 <150 mg/dL Final         Passed - Patient is not pregnant      Passed - Valid encounter within last 12 months    Recent Outpatient Visits          4 months ago Centrilobular emphysema [facility])   [facility] Steele Sizer, MD   10 months ago Vascular dementia with behavior disturbance [facility])   [facility] [place], Drue Stager, MD   1 year ago Major depression with psychotic features ([place])  [facility] Steele Sizer, MD   1 year ago Mild protein-calorie malnutrition [facility])   [facility] Steele Sizer, MD   2 years ago Vascular dementia with behavior disturbance [facility])   [facility] Steele Sizer, MD      Future Appointments            In 1 month Ancil Boozer, Drue Stager, MD [facility]

## 2022-07-04 NOTE — Progress Notes (Signed)
Name: Eugene Murphy   MRN: [id number]    DOB: 04/06/1932   Date:07/05/2022       Progress Note  Subjective  Chief Complaint  Follow Up  HPI  Dementia  with behavior changes/vascular dementia/depression with psychotic features: : his health went down around Summer 2019  he started going to multiple doctors. Had abrupt onset of weight loss   He initially had nausea, weight loss, fatigue. Wife had noticed a change in behavior feeling down, unable to sleep,  pacing all the time, and auditory  hallucinations.   We started him on Citalopram and  Dr. Manuella Ghazi added Seroquel and is now doing well, memory has improved, no longer staying up during the night, no longer having hallucinations, but he is always asking what she is doing, she has to be around him all the time. He refuses to have home health at the house or go to the senior center , explained that his wife needs some time for herself but he refuses it. Currently off Remeron but sill on Aricept 5 mg and now on seroquel twice daily    BPH: he sees Dr. Bernardo Heater, he states nocturia has improved, about twice per night He is taking Cardura    Malnutrition:  He had lost 75 lbs in a period of 2 years, he states appetite is good, eating all meals and still takes protein shakes in am's. His weight is trending up, he states cannot eat big portions but is not skipping meals.   Hyperlipidemia: taking medication and denies side effects, no chest pain or palpitation  Last LDL was down to 43, HDL also at goal at 58 . We will recheck labs today    GERD: he is taking prevacid  and seems to control symptoms. He takes a Tums occasionally because he enjoys the taste  Anemia of chronic diease: no longer seeing Dr. Mike Gip, last levels stable, iron storage normal. We will recheck labs    OSA: he had lost 75  lbs in a period of 2  Years and is no longer snoring and Dr. Raul Del has discontinued his CPAP machine . Denies snoring and is sleeping well   Asthma mild  intermittent/COPD/Emphysema : seeing Dr. 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 Only using albuterol prn, takes singulair for asthma and allergies, also on Flonase prn   Patient Active Problem List   Diagnosis Date Noted   Mild protein-calorie malnutrition ([place]) 12/28/2020   Major depression with psychotic features ([place]) 12/28/2020   Dementia with behavioral disturbance ([place]) 09/12/2018   Abnormal computed tomography of large intestine    Benign neoplasm of ascending colon    Polyp of sigmoid colon    Lesion of liver 06/17/2018   OSA on CPAP 12/31/2017   GERD (gastroesophageal reflux disease) 07/25/2016   Environmental and seasonal allergies 02/29/2016   Elevated PSA 11/02/2015   Hyperlipidemia 04/13/2015   BPH with obstruction/lower urinary tract symptoms 04/06/2015   Hypersomnia with sleep apnea 12/21/2013   COPD with asthma ([place]) 12/21/2013    Past Surgical History:  Procedure Laterality Date   COLONOSCOPY WITH PROPOFOL N/A 06/19/2018   Procedure: COLONOSCOPY WITH PROPOFOL WITH BIOPSIES;  Surgeon: [name], MD;  Location: [place];  Service: Endoscopy;  Laterality: N/A;  sleep apnea   COSMETIC SURGERY Left    Left Arm Plastic Surgery in 1970s    Family History  Problem  Relation Age of Onset   Congestive Heart Failure Father    Healthy Sister    Dementia Brother     Social History   Tobacco Use   Smoking status: Never   Smokeless tobacco: Never  Substance Use Topics   Alcohol use: Not Currently     Current Outpatient Medications:    Albuterol Sulfate 108 (90 Base) MCG/ACT AEPB, Inhale 2 puffs into the lungs as needed. , Disp: , Rfl:    Ascorbic Acid (VITAMIN C) 1000 MG tablet, Take 500 mg by mouth daily. , Disp: , Rfl:    aspirin EC 81 MG tablet, Take 81 mg by mouth daily. Swallow whole., Disp: , Rfl:    Cholecalciferol (VITAMIN D-3) 5000 units TABS, Take 1 tablet  by mouth daily. , Disp: , Rfl:    citalopram (CELEXA) 10 MG tablet, TAKE 1 TABLET DAILY, Disp: 90 tablet, Rfl: 0   doxazosin (CARDURA) 8 MG tablet, TAKE 1 TABLET DAILY, Disp: 90 tablet, Rfl: 3   dutasteride (AVODART) 0.5 MG capsule, Take 1 capsule (0.5 mg total) by mouth daily., Disp: 90 capsule, Rfl: 3   fluticasone (FLONASE) 50 MCG/ACT nasal spray, Place 2 sprays into both nostrils daily., Disp: , Rfl:    lansoprazole (PREVACID) 30 MG capsule, Take 1 capsule (30 mg total) by mouth daily., Disp: 90 capsule, Rfl: 1   levocetirizine (XYZAL) 5 MG tablet, Take 1 tablet (5 mg total) by mouth every evening., Disp: 90 tablet, Rfl: 1   Potassium 99 MG TABS, Take 1 tablet by mouth daily at 8 pm., Disp: , Rfl:    pravastatin (PRAVACHOL) 40 MG tablet, Take 1 tablet (40 mg total) by mouth daily., Disp: 90 tablet, Rfl: 1   QUEtiapine (SEROQUEL) 25 MG tablet, Take 1 tablet (25 mg total) by mouth 2 (two) times daily., Disp: 180 tablet, Rfl: 0   ROCKLATAN 0.02-0.005 % SOLN, Apply 1 drop to eye at bedtime., Disp: , Rfl:    triamcinolone cream (KENALOG) 0.1 %, Apply 1 application topically 2 (two) times daily., Disp: 80 g, Rfl: 0   donepezil (ARICEPT) 5 MG tablet, Take 1 tablet by mouth daily., Disp: , Rfl:   Allergies  Allergen Reactions   Shellfish Allergy Swelling    Throat swells    I personally reviewed active problem list, medication list, allergies, family history, social history, health maintenance with the patient/caregiver today.   ROS  Constitutional: Negative for fever or weight change.  Respiratory: Negative for cough and shortness of breath.   Cardiovascular: Negative for chest pain or palpitations.  Gastrointestinal: Negative for abdominal pain, no bowel changes.  Musculoskeletal: Negative for gait problem or joint swelling.  Skin: Negative for rash.  Neurological: Negative for dizziness or headache.  No other specific complaints in a complete review of systems (except as listed in HPI  above).   Objective  Vitals:   07/05/22 0920  BP: 118/74  Pulse: 79  Resp: 16  SpO2: 99%  Weight: 189 lb (85.7 kg)  Height: '5\' 8"'$  (1.727 m)    Body mass index is 28.74 kg/m.  Physical Exam  Constitutional: Patient appears well-developed and well-nourished. No distress.  HEENT: head atraumatic, normocephalic, pupils equal and reactive to light, neck supple Cardiovascular: Normal rate, regular rhythm and normal heart sounds.  No murmur heard. No BLE edema. Pulmonary/Chest: Effort normal and breath sounds normal. No respiratory distress. Abdominal: Soft.  There is no tenderness. Psychiatric: Patient has a normal mood and affect. behavior is normal. Judgment and thought  content normal.    PHQ2/9:    07/05/2022    9:20 AM 01/02/2022    9:31 AM 07/04/2021    9:28 AM 06/27/2021    9:13 AM 12/28/2020    8:54 AM  Depression screen PHQ 2/9  Decreased Interest 0 0 0 0 0  Down, Depressed, Hopeless 0 0 0 0 0  PHQ - 2 Score 0 0 0 0 0  Altered sleeping 0 0   0  Tired, decreased energy 0 0   0  Change in appetite 0 0   0  Feeling bad or failure about yourself  0 0   0  Trouble concentrating 0 0   0  Moving slowly or fidgety/restless 0 0   0  Suicidal thoughts 0 0   0  PHQ-9 Score 0 0   0  Difficult doing work/chores     Not difficult at all    phq 9 is negative   Fall Risk:    07/05/2022    9:20 AM 01/02/2022    9:31 AM 07/04/2021    9:27 AM 06/27/2021    9:15 AM 12/28/2020    8:54 AM  Fall Risk   Falls in the past year? 0 0 0 0 0  Number falls in past yr: 0 0 0 0 0  Injury with Fall? 0 0 0 0 0  Risk for fall due to : Impaired balance/gait No Fall Risks Impaired balance/gait No Fall Risks   Follow up Falls prevention discussed Falls prevention discussed Falls prevention discussed Falls prevention discussed       Functional Status Survey: Is the patient deaf or have difficulty hearing?: No Does the patient have difficulty seeing, even when wearing glasses/contacts?:  No Does the patient have difficulty concentrating, remembering, or making decisions?: No Does the patient have difficulty walking or climbing stairs?: Yes Does the patient have difficulty dressing or bathing?: No Does the patient have difficulty doing errands alone such as visiting a doctor's office or shopping?: No    Assessment & Plan  1. Major depression with psychotic features ([place])  - citalopram (CELEXA) 10 MG tablet; Take 1 tablet (10 mg total) by mouth daily.  Dispense: 90 tablet; Refill: 1  2. Vascular dementia with behavior disturbance ([facility])  - QUEtiapine (SEROQUEL) 25 MG tablet; Take 1 tablet (25 mg total) by mouth 2 (two) times daily.  Dispense: 180 tablet; Refill: 1 - Lipid panel  3. Centrilobular emphysema ([place])  Under the care of pulmonologist   4. Mild protein-calorie malnutrition ([place])  Gaining weight   5. Gastroesophageal reflux disease without esophagitis  Controlled   6. Vitamin D deficiency   7. Need for immunization against influenza  - Flu Vaccine QUAD High Dose(Fluad)  8. BPH without obstruction/lower urinary tract symptoms   9. Need for pneumococcal 20-valent conjugate vaccination  - Pneumococcal conjugate vaccine 20-valent (Prevnar 20)  10. Anemia of chronic disease  - CBC with Differential/Platelet  11. Dyslipidemia   12. Long-term use of high-risk medication  - COMPLETE METABOLIC PANEL WITH GFR

## 2022-07-05 ENCOUNTER — Ambulatory Visit (INDEPENDENT_AMBULATORY_CARE_PROVIDER_SITE_OTHER): Payer: Medicare HMO | Admitting: Family Medicine

## 2022-07-05 ENCOUNTER — Encounter: Payer: Self-pay | Admitting: Family Medicine

## 2022-07-05 VITALS — BP 118/74 | HR 79 | Resp 16 | Ht 68.0 in | Wt 189.0 lb

## 2022-07-05 DIAGNOSIS — K219 Gastro-esophageal reflux disease without esophagitis: Secondary | ICD-10-CM

## 2022-07-05 DIAGNOSIS — F323 Major depressive disorder, single episode, severe with psychotic features: Secondary | ICD-10-CM | POA: Diagnosis not present

## 2022-07-05 DIAGNOSIS — F01518 Vascular dementia, unspecified severity, with other behavioral disturbance: Secondary | ICD-10-CM | POA: Diagnosis not present

## 2022-07-05 DIAGNOSIS — E441 Mild protein-calorie malnutrition: Secondary | ICD-10-CM

## 2022-07-05 DIAGNOSIS — E785 Hyperlipidemia, unspecified: Secondary | ICD-10-CM

## 2022-07-05 DIAGNOSIS — J432 Centrilobular emphysema: Secondary | ICD-10-CM | POA: Diagnosis not present

## 2022-07-05 DIAGNOSIS — Z23 Encounter for immunization: Secondary | ICD-10-CM | POA: Diagnosis not present

## 2022-07-05 DIAGNOSIS — D638 Anemia in other chronic diseases classified elsewhere: Secondary | ICD-10-CM

## 2022-07-05 DIAGNOSIS — N400 Enlarged prostate without lower urinary tract symptoms: Secondary | ICD-10-CM

## 2022-07-05 DIAGNOSIS — Z79899 Other long term (current) drug therapy: Secondary | ICD-10-CM

## 2022-07-05 DIAGNOSIS — E559 Vitamin D deficiency, unspecified: Secondary | ICD-10-CM

## 2022-07-05 MED ORDER — QUETIAPINE FUMARATE 25 MG PO TABS
25.0000 mg | ORAL_TABLET | Freq: Two times a day (BID) | ORAL | 1 refills | Status: DC
Start: 2022-07-05 — End: 2023-02-11

## 2022-07-05 MED ORDER — CITALOPRAM HYDROBROMIDE 10 MG PO TABS: 10.0000 mg | ORAL_TABLET | Freq: Every day | ORAL | 1 refills | Status: DC

## 2022-07-06 ENCOUNTER — Encounter: Payer: Self-pay | Admitting: Urology

## 2022-07-06 LAB — CBC WITH DIFFERENTIAL/PLATELET
Absolute Monocytes: 727 cells/uL (ref 200–950)
Basophils Absolute: 32 cells/uL (ref 0–200)
Basophils Relative: 0.7 %
Eosinophils Absolute: 469 cells/uL (ref 15–500)
Eosinophils Relative: 10.2 %
HCT: 38.5 % (ref 38.5–50.0)
Hemoglobin: 12.8 g/dL — ABNORMAL LOW (ref 13.2–17.1)
Lymphs Abs: 1348 cells/uL (ref 850–3900)
MCH: 31.4 pg (ref 27.0–33.0)
MCHC: 33.2 g/dL (ref 32.0–36.0)
MCV: 94.6 fL (ref 80.0–100.0)
MPV: 11.1 fL (ref 7.5–12.5)
Monocytes Relative: 15.8 %
Neutro Abs: 2024 cells/uL (ref 1500–7800)
Neutrophils Relative %: 44 %
Platelets: 179 10*3/uL (ref 140–400)
RBC: 4.07 10*6/uL — ABNORMAL LOW (ref 4.20–5.80)
RDW: 11.7 % (ref 11.0–15.0)
Total Lymphocyte: 29.3 %
WBC: 4.6 10*3/uL (ref 3.8–10.8)

## 2022-07-06 LAB — LIPID PANEL
Cholesterol: 121 mg/dL (ref <200)
HDL: 55 mg/dL (ref >=40)
LDL Cholesterol (Calc): 52 mg/dL (calc)
Non-HDL Cholesterol (Calc): 66 mg/dL (calc) (ref <130)
Total CHOL/HDL Ratio: 2.2 (calc) (ref <5.0)
Triglycerides: 60 mg/dL (ref <150)

## 2022-07-06 LAB — COMPLETE METABOLIC PANEL WITH GFR
AG Ratio: 1.6 (calc) (ref 1.0–2.5)
ALT: 22 U/L (ref 9–46)
AST: 19 U/L (ref 10–35)
Albumin: 4.2 g/dL (ref 3.6–5.1)
Alkaline phosphatase (APISO): 43 U/L (ref 35–144)
BUN/Creatinine Ratio: 28 (calc) — ABNORMAL HIGH (ref 6–22)
BUN: 29 mg/dL — ABNORMAL HIGH (ref 7–25)
CO2: 30 mmol/L (ref 20–32)
Calcium: 9.7 mg/dL (ref 8.6–10.3)
Chloride: 102 mmol/L (ref 98–110)
Creat: 1.02 mg/dL (ref 0.70–1.22)
Globulin: 2.6 g/dL (calc) (ref 1.9–3.7)
Glucose, Bld: 86 mg/dL (ref 65–139)
Potassium: 4.3 mmol/L (ref 3.5–5.3)
Sodium: 139 mmol/L (ref 135–146)
Total Bilirubin: 0.4 mg/dL (ref 0.2–1.2)
Total Protein: 6.8 g/dL (ref 6.1–8.1)
eGFR: 71 mL/min/{1.73_m2} (ref >=60)

## 2022-10-25 ENCOUNTER — Telehealth: Payer: Self-pay | Admitting: Family Medicine

## 2022-10-25 NOTE — Telephone Encounter (Signed)
LVM for pt to rtn my call to schedule AWV with [facility] call back # [phone] 

## 2022-10-31 ENCOUNTER — Other Ambulatory Visit: Payer: Self-pay | Admitting: Urology

## 2022-10-31 DIAGNOSIS — N138 Other obstructive and reflux uropathy: Secondary | ICD-10-CM

## 2022-11-30 ENCOUNTER — Ambulatory Visit (INDEPENDENT_AMBULATORY_CARE_PROVIDER_SITE_OTHER): Payer: Medicare HMO

## 2022-11-30 VITALS — BP 118/64 | Ht 68.0 in | Wt 188.7 lb

## 2022-11-30 DIAGNOSIS — Z0000 Encounter for general adult medical examination without abnormal findings: Secondary | ICD-10-CM

## 2022-11-30 NOTE — Progress Notes (Signed)
Subjective:   Eugene Murphy is a 89 y.o. male who presents for Medicare Annual/Subsequent preventive examination.  Review of Systems    Cardiac Risk Factors include: advanced age (>77mn, >>35women);dyslipidemia;sedentary lifestyle;male gender    Objective:    Today's Vitals   11/30/22 0950  BP: 118/64  Weight: 188 lb 11.2 oz (85.6 kg)  Height: '5\' 8"'$  (1.727 m)   Body mass index is 28.69 kg/m.     11/30/2022   10:02 AM 06/27/2021    9:14 AM 02/16/2020    9:48 AM 07/07/2018    2:18 PM 06/23/2018    1:08 PM 06/19/2018    8:05 AM 06/17/2018    9:06 AM  Advanced Directives  Does Patient Have a Medical Advance Directive? No No No Yes No Yes No  Type of AVisual merchandiserof AOakland AcresLiving will   Copy of [place] Chart?      Yes   Would patient like information on creating a medical advance directive?  Yes (MAU/Ambulatory/Procedural Areas - Information given) No - Patient declined  No - Patient declined      Current Medications (verified) Outpatient Encounter Medications as of 11/30/2022  Medication Sig   Albuterol Sulfate 108 (90 Base) MCG/ACT AEPB Inhale 2 puffs into the lungs as needed.    Ascorbic Acid (VITAMIN C) 1000 MG tablet Take 500 mg by mouth daily.    aspirin EC 81 MG tablet Take 81 mg by mouth daily. Swallow whole.   Cholecalciferol (VITAMIN D3) 50 MCG (2000 UT) CAPS Take 1 tablet by mouth daily.   citalopram (CELEXA) 10 MG tablet Take 1 tablet (10 mg total) by mouth daily.   doxazosin (CARDURA) 8 MG tablet TAKE 1 TABLET DAILY   dutasteride (AVODART) 0.5 MG capsule Take 1 capsule (0.5 mg total) by mouth daily.   fluticasone (FLONASE) 50 MCG/ACT nasal spray Place 2 sprays into both nostrils daily.   lansoprazole (PREVACID) 30 MG capsule Take 1 capsule (30 mg total) by mouth daily.   levocetirizine (XYZAL) 5 MG tablet Take 1 tablet (5 mg total) by mouth every evening.   Potassium 99 MG TABS Take 1 tablet by mouth daily at 8  pm.   pravastatin (PRAVACHOL) 40 MG tablet Take 1 tablet (40 mg total) by mouth daily.   QUEtiapine (SEROQUEL) 25 MG tablet Take 1 tablet (25 mg total) by mouth 2 (two) times daily.   ROCKLATAN 0.02-0.005 % SOLN Apply 1 drop to eye at bedtime.   triamcinolone cream (KENALOG) 0.1 % Apply 1 application topically 2 (two) times daily.   donepezil (ARICEPT) 5 MG tablet Take 1 tablet by mouth daily.   No facility-administered encounter medications on file as of 11/30/2022.    Allergies (verified) Shellfish allergy   History: Past Medical History:  Diagnosis Date   Arthritis    lower back   Asthma    BPH (benign prostatic hyperplasia)    Followed by UBonna Gains   GERD (gastroesophageal reflux disease)    Glaucoma    Hyperlipidemia    Hypertension    Sleep apnea    CPAP   Past Surgical History:  Procedure Laterality Date   COLONOSCOPY WITH PROPOFOL N/A 06/19/2018   Procedure: COLONOSCOPY WITH PROPOFOL WITH BIOPSIES;  Surgeon: WLucilla Lame MD;  [place] [facility]  Service: Endoscopy;  Laterality: N/A;  sleep apnea   COSMETIC SURGERY Left    Left Arm Plastic Surgery in 1970s   Family  History  Problem Relation Age of Onset   Congestive Heart Failure Father    Healthy Sister    Dementia Brother    Social History   Socioeconomic History   Marital status: Married    Spouse name: Flora   Number of children: 3   Years of education: Not on file   Highest education level: 9th grade  Occupational History   Occupation: cab drivers    Comment: retired many years ago   Tobacco Use   Smoking status: Never   Smokeless tobacco: Never  Vaping Use   Vaping Use: Never used  Substance and Sexual Activity   Alcohol use: Not Currently   Drug use: No   Sexual activity: Yes    Partners: Female  Other Topics Concern   Not on file  Social History Narrative   He had 3 children prior to getting married, one died in a motorcycle accident    He has two grown children in  [place] and one in [place] here from [place] after retirement    Social Determinants of [place] Strain: [place]  (11/30/2022)   Overall Financial Resource Strain (CARDIA)    Difficulty of Paying Living Expenses: Not hard at all  Food Insecurity: No Food Insecurity (11/30/2022)   Hunger Vital Sign    Worried About Running Out of Food in the Last Year: Never true    [place] in the Last Year: Never true  Transportation Needs: No Transportation Needs (11/30/2022)   PRAPARE - Hydrologist (Medical): No    Lack of Transportation (Non-Medical): No  Physical Activity: Inactive (11/30/2022)   Exercise Vital Sign    Days of Exercise per Week: 0 days    Minutes of Exercise per Session: 0 min  Stress: No Stress Concern Present (11/30/2022)   [place]    Feeling of Stress : Not at all  Social Connections: Moderately Integrated (11/30/2022)   Social Connection and Isolation Panel [NHANES]    Frequency of Communication with Friends and Family: Three times a week    Frequency of Social Gatherings with Friends and Family: Once a week    Attends Religious Services: More than 4 times per year    Active Member of [employer] or Organizations: No    Attends Music therapist: Never    Marital Status: Married    Tobacco Counseling Counseling given: Not Answered   Clinical Intake:  Pre-visit preparation completed: Yes  Pain : No/denies pain     BMI - recorded: 28.69 Nutritional Status: BMI 25 -29 Overweight Nutritional Risks: None Diabetes: No  How often do you need to have someone help you when you read instructions, pamphlets, or other written materials from your doctor or pharmacy?: 1 - Never  Diabetic?no  Interpreter Needed?: No  Information entered by :: B.Nattaly Yebra,LPN   Activities of Daily Living    11/30/2022   10:02 AM 07/05/2022    9:20  AM  In your present state of health, do you have any difficulty performing the following activities:  Hearing? 0 0  Vision? 0 0  Difficulty concentrating or making decisions? 1 0  Walking or climbing stairs? 1 1  Dressing or bathing? 0 0  Doing errands, shopping? 1 0  Preparing Food and eating ? N   Using the Toilet? N   In the past six months, have you accidently leaked  urine? N   Do you have problems with loss of bowel control? N   Managing your Medications? N   Managing your Finances? N   Housekeeping or managing your Housekeeping? Y     Patient Care Team: Steele Sizer, MD as PCP - General (Family Medicine) Erby Pian, MD as Referring Physician (Specialist) Karren Burly Deirdre Peer, MD as Referring Physician (Ophthalmology) Teodoro Spray, MD as Consulting Physician (Cardiology) Abbie Sons, MD as Consulting Physician (Urology) Clent Jacks, RN as Registered Nurse Vladimir Crofts, MD as Consulting Physician (Neurology)  Indicate any recent Medical Services you may have received from other than [facility] providers in the past year (date may be approximate).     Assessment:   This is a routine wellness examination for [place].  Hearing/Vision screen Hearing Screening - Comments:: Adequate hearing  Vision Screening - Comments:: Adequate vision w/glasses [facility]  Dietary issues and exercise activities discussed: Current Exercise Habits: The patient does not participate in regular exercise at present, Exercise limited by: orthopedic condition(s)   Goals Addressed             This Visit's Progress    Exercise   Not on track    Pt to start back exercising/walking for 30 minutes 2-3 days a week.       Depression Screen    11/30/2022    9:58 AM 07/05/2022    9:20 AM 01/02/2022    9:31 AM 07/04/2021    9:28 AM 06/27/2021    9:13 AM 12/28/2020    8:54 AM 06/30/2020    9:28 AM  PHQ 2/9 Scores  PHQ - 2 Score 0 0 0 0 0 0 0  PHQ- 9 Score  0 0   0      Fall Risk    11/30/2022    9:56 AM 07/05/2022    9:20 AM 01/02/2022    9:31 AM 07/04/2021    9:27 AM 06/27/2021    9:15 AM  Fall Risk   Falls in the past year? 0 0 0 0 0  Number falls in past yr: 0 0 0 0 0  Injury with Fall? 0 0 0 0 0  Risk for fall due to : No Fall Risks Impaired balance/gait No Fall Risks Impaired balance/gait No Fall Risks  Follow up Education provided;Falls prevention discussed Falls prevention discussed Falls prevention discussed Falls prevention discussed Falls prevention discussed    FALL RISK PREVENTION PERTAINING TO THE HOME:  Any stairs in or around the home? Yes outside If so, are there any without handrails? Yes  Home free of loose throw rugs in walkways, pet beds, electrical cords, etc? Yes  Adequate lighting in your home to reduce risk of falls? Yes   ASSISTIVE DEVICES UTILIZED TO PREVENT FALLS:  Life alert? No  Use of a cane, walker or w/c? Yes cane Grab bars in the bathroom? Yes  Shower chair or bench in shower? Yes  Elevated toilet seat or a handicapped toilet? Yes   TIMED UP AND GO:  Was the test performed? Yes .  Length of time to ambulate 10 feet: 20 sec.   Gait slow and steady with assistive device  Cognitive Function:        11/30/2022   10:04 AM 07/09/2018    9:39 AM 12/17/2016    9:41 AM  6CIT Screen  What Year? 0 points 4 points 0 points  What month? 0 points 3 points 0 points  What time? 0 points  0 points 0 points  Count back from 20 4 points 0 points 0 points  Months in reverse 4 points 4 points 4 points  Repeat phrase 10 points 10 points 2 points  Total Score 18 points 21 points 6 points    Immunizations Immunization History  Administered Date(s) Administered   Fluad Quad(high Dose 65+) 06/30/2019, 06/30/2020, 07/04/2021, 07/05/2022   Influenza, High Dose Seasonal PF 06/14/2015, 06/14/2015, 06/15/2016, 06/19/2017, 06/17/2018   Influenza-Unspecified 06/19/2017   PFIZER(Purple Top)SARS-COV-2 Vaccination 11/25/2019,  12/16/2019, 07/17/2020   PNEUMOCOCCAL CONJUGATE-20 07/05/2022   Pneumococcal Conjugate-13 12/01/2013   Tdap 06/05/2011, 07/04/2021   Zoster Recombinat (Shingrix) 12/08/2020, 04/04/2021    TDAP status: Up to date  Flu Vaccine status: Up to date  Pneumococcal vaccine status: Up to date  Covid-19 vaccine status: Completed vaccines  Qualifies for Shingles Vaccine? Yes   Zostavax completed Yes   Shingrix Completed?: Yes  Screening Tests Health Maintenance  Topic Date Due   COVID-19 Vaccine (4 - 2023-24 season) 06/08/2022   Medicare Annual Wellness (AWV)  06/27/2022   DTaP/Tdap/Td (3 - Td or Tdap) 07/05/2031   Pneumonia Vaccine 39+ Years old  Completed   INFLUENZA VACCINE  Completed   Zoster Vaccines- Shingrix  Completed   HPV VACCINES  Aged Out    Health Maintenance  Health Maintenance Due  Topic Date Due   COVID-19 Vaccine (4 - 2023-24 season) 06/08/2022   Medicare Annual Wellness (AWV)  06/27/2022    Colorectal cancer screening: No longer required.   Lung Cancer Screening: (Low Dose CT Chest recommended if Age 32-80 years, 30 pack-year currently smoking OR have quit w/in 15years.) does not qualify.   Lung Cancer Screening Referral: no  Additional Screening:  Hepatitis C Screening: does not qualify; Completed no  Vision Screening: Recommended annual ophthalmology exams for early detection of glaucoma and other disorders of the eye. Is the patient up to date with their annual eye exam?  Yes  Who is the provider or what is the name of the office in which the patient attends annual eye exams? [facility] If pt is not established with a provider, would they like to be referred to a provider to establish care? No .   Dental Screening: Recommended annual dental exams for proper oral hygiene  Community Resource Referral / Chronic Care Management: CRR required this visit?  No   CCM required this visit?  No      Plan:     I have personally reviewed and noted the  following in the patient's chart:   Medical and social history Use of alcohol, tobacco or illicit drugs  Current medications and supplements including opioid prescriptions. Patient is not currently taking opioid prescriptions. Functional ability and status Nutritional status Physical activity Advanced directives List of other physicians Hospitalizations, surgeries, and ER visits in previous 12 months Vitals Screenings to include cognitive, depression, and falls Referrals and appointments  In addition, I have reviewed and discussed with patient certain preventive protocols, quality metrics, and best practice recommendations. A written personalized care plan for preventive services as well as general preventive health recommendations were provided to patient.     Roger Shelter, LPN   [id number]   Nurse Notes: pt is doing well has no concerns or questions

## 2022-11-30 NOTE — Patient Instructions (Signed)
Mr. Eugene Murphy , Thank you for taking time to come for your Medicare Wellness Visit. I appreciate your ongoing commitment to your health goals. Please review the following plan we discussed and let me know if I can assist you in the future.   These are the goals we discussed:  Goals      Exercise     Pt to start back exercising/walking for 30 minutes 2-3 days a week.        This is a list of the screening recommended for you and due dates:  Health Maintenance  Topic Date Due   COVID-19 Vaccine (4 - 2023-24 season) 06/08/2022   Medicare Annual Wellness Visit  06/27/2022   DTaP/Tdap/Td vaccine (3 - Td or Tdap) 07/05/2031   Pneumonia Vaccine  Completed   Flu Shot  Completed   Zoster (Shingles) Vaccine  Completed   HPV Vaccine  Aged Out    Advanced directives: no paperwork given  Conditions/risks identified: low falls risk  Next appointment: Follow up in one year for your annual wellness visit. 12/06/2023'@10am'$   in person  Preventive Care 73 Years and Older, Male  Preventive care refers to lifestyle choices and visits with your health care provider that can promote health and wellness. What does preventive care include? A yearly physical exam. This is also called an annual well check. Dental exams once or twice a year. Routine eye exams. Ask your health care provider how often you should have your eyes checked. Personal lifestyle choices, including: Daily care of your teeth and gums. Regular physical activity. Eating a healthy diet. Avoiding tobacco and drug use. Limiting alcohol use. Practicing safe sex. Taking low doses of aspirin every day. Taking vitamin and mineral supplements as recommended by your health care provider. What happens during an annual well check? The services and screenings done by your health care provider during your annual well check will depend on your age, overall health, lifestyle risk factors, and family history of disease. Counseling  Your health  care provider may ask you questions about your: Alcohol use. Tobacco use. Drug use. Emotional well-being. Home and relationship well-being. Sexual activity. Eating habits. History of falls. Memory and ability to understand (cognition). Work and work Statistician. Screening  You may have the following tests or measurements: Height, weight, and BMI. Blood pressure. Lipid and cholesterol levels. These may be checked every 5 years, or more frequently if you are over 69 years old. Skin check. Lung cancer screening. You may have this screening every year starting at age 95 if you have a 30-pack-year history of smoking and currently smoke or have quit within the past 15 years. Fecal occult blood test (FOBT) of the stool. You may have this test every year starting at age 28. Flexible sigmoidoscopy or colonoscopy. You may have a sigmoidoscopy every 5 years or a colonoscopy every 10 years starting at age 76. Prostate cancer screening. Recommendations will vary depending on your family history and other risks. Hepatitis C blood test. Hepatitis B blood test. Sexually transmitted disease (STD) testing. Diabetes screening. This is done by checking your blood sugar (glucose) after you have not eaten for a while (fasting). You may have this done every 1-3 years. Abdominal aortic aneurysm (AAA) screening. You may need this if you are a current or former smoker. Osteoporosis. You may be screened starting at age 22 if you are at high risk. Talk with your health care provider about your test results, treatment options, and if necessary, the need for more  tests. Vaccines  Your health care provider may recommend certain vaccines, such as: Influenza vaccine. This is recommended every year. Tetanus, diphtheria, and acellular pertussis (Tdap, Td) vaccine. You may need a Td booster every 10 years. Zoster vaccine. You may need this after age 2. Pneumococcal 13-valent conjugate (PCV13) vaccine. One dose is  recommended after age 70. Pneumococcal polysaccharide (PPSV23) vaccine. One dose is recommended after age 70. Talk to your health care provider about which screenings and vaccines you need and how often you need them. This information is not intended to replace advice given to you by your health care provider. Make sure you discuss any questions you have with your health care provider. Document Released: 10/21/2015 Document Revised: 06/13/2016 Document Reviewed: 07/26/2015 Elsevier Interactive Patient Education  2017 [place] Prevention in the Home Falls can cause injuries. They can happen to people of all ages. There are many things you can do to make your home safe and to help prevent falls. What can I do on the outside of my home? Regularly fix the edges of walkways and driveways and fix any cracks. Remove anything that might make you trip as you walk through a door, such as a raised step or threshold. Trim any bushes or trees on the path to your home. Use bright outdoor lighting. Clear any walking paths of anything that might make someone trip, such as rocks or tools. Regularly check to see if handrails are loose or broken. Make sure that both sides of any steps have handrails. Any raised decks and porches should have guardrails on the edges. Have any leaves, snow, or ice cleared regularly. Use sand or salt on walking paths during winter. Clean up any spills in your garage right away. This includes oil or grease spills. What can I do in the bathroom? Use night lights. Install grab bars by the toilet and in the tub and shower. Do not use towel bars as grab bars. Use non-skid mats or decals in the tub or shower. If you need to sit down in the shower, use a plastic, non-slip stool. Keep the floor dry. Clean up any water that spills on the floor as soon as it happens. Remove soap buildup in the tub or shower regularly. Attach bath mats securely with double-sided non-slip rug  tape. Do not have throw rugs and other things on the floor that can make you trip. What can I do in the bedroom? Use night lights. Make sure that you have a light by your bed that is easy to reach. Do not use any sheets or blankets that are too big for your bed. They should not hang down onto the floor. Have a firm chair that has side arms. You can use this for support while you get dressed. Do not have throw rugs and other things on the floor that can make you trip. What can I do in the kitchen? Clean up any spills right away. Avoid walking on wet floors. Keep items that you use a lot in easy-to-reach places. If you need to reach something above you, use a strong step stool that has a grab bar. Keep electrical cords out of the way. Do not use floor polish or wax that makes floors slippery. If you must use wax, use non-skid floor wax. Do not have throw rugs and other things on the floor that can make you trip. What can I do with my stairs? Do not leave any items on the stairs. Make sure  that there are handrails on both sides of the stairs and use them. Fix handrails that are broken or loose. Make sure that handrails are as long as the stairways. Check any carpeting to make sure that it is firmly attached to the stairs. Fix any carpet that is loose or worn. Avoid having throw rugs at the top or bottom of the stairs. If you do have throw rugs, attach them to the floor with carpet tape. Make sure that you have a light switch at the top of the stairs and the bottom of the stairs. If you do not have them, ask someone to add them for you. What else can I do to help prevent falls? Wear shoes that: Do not have high heels. Have rubber bottoms. Are comfortable and fit you well. Are closed at the toe. Do not wear sandals. If you use a stepladder: Make sure that it is fully opened. Do not climb a closed stepladder. Make sure that both sides of the stepladder are locked into place. Ask someone to  hold it for you, if possible. Clearly mark and make sure that you can see: Any grab bars or handrails. First and last steps. Where the edge of each step is. Use tools that help you move around (mobility aids) if they are needed. These include: Canes. Walkers. Scooters. Crutches. Turn on the lights when you go into a dark area. Replace any light bulbs as soon as they burn out. Set up your furniture so you have a clear path. Avoid moving your furniture around. If any of your floors are uneven, fix them. If there are any pets around you, be aware of where they are. Review your medicines with your doctor. Some medicines can make you feel dizzy. This can increase your chance of falling. Ask your doctor what other things that you can do to help prevent falls. This information is not intended to replace advice given to you by your health care provider. Make sure you discuss any questions you have with your health care provider. Document Released: 07/21/2009 Document Revised: 03/01/2016 Document Reviewed: 10/29/2014 Elsevier Interactive Patient Education  2017 [employer].

## 2022-12-14 ENCOUNTER — Ambulatory Visit: Payer: Medicare HMO | Admitting: Urology

## 2022-12-21 ENCOUNTER — Ambulatory Visit: Payer: Medicare HMO | Admitting: Urology

## 2022-12-21 ENCOUNTER — Encounter: Payer: Self-pay | Admitting: Urology

## 2022-12-21 VITALS — BP 126/81 | HR 80 | Ht 68.0 in | Wt 185.0 lb

## 2022-12-21 DIAGNOSIS — N138 Other obstructive and reflux uropathy: Secondary | ICD-10-CM | POA: Diagnosis not present

## 2022-12-21 DIAGNOSIS — N401 Enlarged prostate with lower urinary tract symptoms: Secondary | ICD-10-CM | POA: Diagnosis not present

## 2022-12-21 LAB — BLADDER SCAN AMB NON-IMAGING: Scan Result: 0

## 2022-12-21 MED ORDER — DUTASTERIDE 0.5 MG PO CAPS: 0.5000 mg | ORAL_CAPSULE | Freq: Every day | ORAL | 3 refills | Status: DC

## 2022-12-21 NOTE — Progress Notes (Signed)
12/21/2022 12:21 PM   Eugene Murphy [birth year] [id number]  Referring provider: Steele Sizer, MD [address] [place],  [place] [address]  Chief Complaint  Patient presents with   Benign Prostatic Hypertrophy    HPI: 89 y.o. male presents for annual follow-up BPH.  No significant changes since last years visit Remains on dutasteride 1 tab weekly and doxazosin 8 mg daily No bothersome LUTS per patient however his wife states he is getting 2-3 times per night.  He states he is not having any problems Denies dysuria, gross hematuria Denies flank, abdominal or pelvic pain  PMH: Past Medical History:  Diagnosis Date   Arthritis    lower back   Asthma    BPH (benign prostatic hyperplasia)    Followed by Bonna Gains.   GERD (gastroesophageal reflux disease)    Glaucoma    Hyperlipidemia    Hypertension    Sleep apnea    CPAP    Surgical History: Past Surgical History:  Procedure Laterality Date   COLONOSCOPY WITH PROPOFOL N/A 06/19/2018   Procedure: COLONOSCOPY WITH PROPOFOL WITH BIOPSIES;  Surgeon: [name], MD;  Location: [facility];  Service: Endoscopy;  Laterality: N/A;  sleep apnea   COSMETIC SURGERY Left    Left Arm Plastic Surgery in 1970s    Home Medications:  Allergies as of 12/21/2022       Reactions   Shellfish Allergy Swelling   Throat swells        Medication List        Accurate as of December 21, 2022 12:21 PM. If you have any questions, ask your nurse or doctor.          Albuterol Sulfate 108 (90 Base) MCG/ACT Aepb Commonly known as: PROAIR RESPICLICK Inhale 2 puffs into the lungs as needed.   aspirin EC 81 MG tablet Take 81 mg by mouth daily. Swallow whole.   citalopram 10 MG tablet Commonly known as: CELEXA Take 1 tablet (10 mg total) by mouth daily.   donepezil 5 MG tablet Commonly known as: ARICEPT Take 1 tablet by mouth daily.   doxazosin 8 MG tablet Commonly known as: CARDURA TAKE 1 TABLET  DAILY   dutasteride 0.5 MG capsule Commonly known as: Avodart Take 1 capsule (0.5 mg total) by mouth daily.   fluticasone 50 MCG/ACT nasal spray Commonly known as: FLONASE Place 2 sprays into both nostrils daily.   lansoprazole 30 MG capsule Commonly known as: PREVACID Take 1 capsule (30 mg total) by mouth daily.   levocetirizine 5 MG tablet Commonly known as: XYZAL Take 1 tablet (5 mg total) by mouth every evening.   Potassium 99 MG Tabs Take 1 tablet by mouth daily at 8 pm.   pravastatin 40 MG tablet Commonly known as: PRAVACHOL Take 1 tablet (40 mg total) by mouth daily.   QUEtiapine 25 MG tablet Commonly known as: SEROQUEL Take 1 tablet (25 mg total) by mouth 2 (two) times daily.   Rocklatan 0.02-0.005 % Soln Generic drug: Netarsudil-Latanoprost Apply 1 drop to eye at bedtime.   triamcinolone cream 0.1 % Commonly known as: KENALOG Apply 1 application topically 2 (two) times daily.   vitamin C 1000 MG tablet Take 500 mg by mouth daily.   vitamin D3 50 MCG (2000 UT) Caps Take 1 tablet by mouth daily.        Allergies:  Allergies  Allergen Reactions   Shellfish Allergy Swelling    Throat swells    Family History: Family  History  Problem Relation Age of Onset   Congestive Heart Failure Father    Healthy Sister    Dementia Brother     Social History:  reports that he has never smoked. He has never used smokeless tobacco. He reports that he does not currently use alcohol. He reports that he does not use drugs.   Physical Exam: BP 126/81   Pulse 80   Ht 5\' 8"  (1.727 m)   Wt 185 lb (83.9 kg)   BMI 28.13 kg/m   Constitutional:  Alert and oriented, No acute distress. HEENT: [place] AT, moist mucus membranes.  Trachea midline, no masses. Cardiovascular: No clubbing, cyanosis, or edema. Respiratory: Normal respiratory effort, no increased work of breathing.  Laboratory Data:  Urinalysis Dipstick/microscopy negative   Assessment & Plan:    1. BPH  with obstruction/lower urinary tract symptoms Stable LUTS on doxazosin/dutasteride Dutasteride refilled  I offered a trial of immediate release trospium at bedtime for nocturia however patient declined Bladder scan PVR 0 mL Continue annual follow-up   Abbie Sons, MD  [place] [address], [place],  [address] [phone]

## 2023-01-02 NOTE — Progress Notes (Deleted)
Name: Eugene Murphy   MRN: [id number]    DOB: 1931-11-13   Date:01/02/2023       Progress Note  Subjective  Chief Complaint  Follow Up  HPI  Dementia  with behavior changes/vascular dementia/depression with psychotic features: : his health went down around Summer 2019  he started going to multiple doctors. Had abrupt onset of weight loss   He initially had nausea, weight loss, fatigue. Wife had noticed a change in behavior feeling down, unable to sleep,  pacing all the time, and auditory  hallucinations.   We started him on Citalopram and  Dr. Manuella Ghazi added Seroquel and is now doing well, memory has improved, no longer staying up during the night, no longer having hallucinations, but he is always asking what she is doing, she has to be around him all the time. He refuses to have home health at the house or go to the senior center , explained that his wife needs some time for herself but he refuses it. Currently off Remeron but sill on Aricept 5 mg and now on seroquel twice daily    BPH: he sees Dr. Bernardo Heater, he states nocturia has improved, about twice per night He is taking Cardura    Malnutrition:  He had lost 75 lbs in a period of 2 years, he states appetite is good, eating all meals and still takes protein shakes in am's. His weight is trending up, he states cannot eat big portions but is not skipping meals.   Hyperlipidemia: taking medication and denies side effects, no chest pain or palpitation  Last LDL was down to 43, HDL also at goal at 58 . We will recheck labs today    GERD: he is taking prevacid  and seems to control symptoms. He takes a Tums occasionally because he enjoys the taste  Anemia of chronic diease: no longer seeing Dr. Mike Gip, last levels stable, iron storage normal. We will recheck labs    OSA: he had lost 75  lbs in a period of 2  Years and is no longer snoring and Dr. Raul Del has discontinued his CPAP machine . Denies snoring and is sleeping well   Asthma mild  intermittent/COPD/Emphysema : seeing Dr. 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 Only using albuterol prn, takes singulair for asthma and allergies, also on Flonase prn   Patient Active Problem List   Diagnosis Date Noted   Mild protein-calorie malnutrition ([place]) 12/28/2020   Major depression with psychotic features ([place]) 12/28/2020   Dementia with behavioral disturbance ([place]) 09/12/2018   Abnormal computed tomography of large intestine    Benign neoplasm of ascending colon    Polyp of sigmoid colon    Lesion of liver 06/17/2018   OSA on CPAP 12/31/2017   GERD (gastroesophageal reflux disease) 07/25/2016   Environmental and seasonal allergies 02/29/2016   Elevated PSA 11/02/2015   Hyperlipidemia 04/13/2015   BPH with obstruction/lower urinary tract symptoms 04/06/2015   Hypersomnia with sleep apnea 12/21/2013   COPD with asthma 12/21/2013    Past Surgical History:  Procedure Laterality Date   COLONOSCOPY WITH PROPOFOL N/A 06/19/2018   Procedure: COLONOSCOPY WITH PROPOFOL WITH BIOPSIES;  Surgeon: [name], MD;  Location: [facility];  Service: Endoscopy;  Laterality: N/A;  sleep apnea   COSMETIC SURGERY Left    Left Arm Plastic Surgery in 1970s    Family History  Problem Relation  Age of Onset   Congestive Heart Failure Father    Healthy Sister    Dementia Brother     Social History   Tobacco Use   Smoking status: Never   Smokeless tobacco: Never  Substance Use Topics   Alcohol use: Not Currently     Current Outpatient Medications:    Albuterol Sulfate 108 (90 Base) MCG/ACT AEPB, Inhale 2 puffs into the lungs as needed. , Disp: , Rfl:    Ascorbic Acid (VITAMIN C) 1000 MG tablet, Take 500 mg by mouth daily. , Disp: , Rfl:    aspirin EC 81 MG tablet, Take 81 mg by mouth daily. Swallow whole., Disp: , Rfl:    Cholecalciferol (VITAMIN D3) 50 MCG (2000 UT) CAPS, Take 1 tablet  by mouth daily., Disp: , Rfl:    citalopram (CELEXA) 10 MG tablet, Take 1 tablet (10 mg total) by mouth daily., Disp: 90 tablet, Rfl: 1   donepezil (ARICEPT) 5 MG tablet, Take 1 tablet by mouth daily., Disp: , Rfl:    doxazosin (CARDURA) 8 MG tablet, TAKE 1 TABLET DAILY, Disp: 90 tablet, Rfl: 3   dutasteride (AVODART) 0.5 MG capsule, Take 1 capsule (0.5 mg total) by mouth daily., Disp: 90 capsule, Rfl: 3   fluticasone (FLONASE) 50 MCG/ACT nasal spray, Place 2 sprays into both nostrils daily., Disp: , Rfl:    lansoprazole (PREVACID) 30 MG capsule, Take 1 capsule (30 mg total) by mouth daily., Disp: 90 capsule, Rfl: 1   levocetirizine (XYZAL) 5 MG tablet, Take 1 tablet (5 mg total) by mouth every evening., Disp: 90 tablet, Rfl: 1   Potassium 99 MG TABS, Take 1 tablet by mouth daily at 8 pm., Disp: , Rfl:    pravastatin (PRAVACHOL) 40 MG tablet, Take 1 tablet (40 mg total) by mouth daily., Disp: 90 tablet, Rfl: 1   QUEtiapine (SEROQUEL) 25 MG tablet, Take 1 tablet (25 mg total) by mouth 2 (two) times daily., Disp: 180 tablet, Rfl: 1   ROCKLATAN 0.02-0.005 % SOLN, Apply 1 drop to eye at bedtime., Disp: , Rfl:    triamcinolone cream (KENALOG) 0.1 %, Apply 1 application topically 2 (two) times daily., Disp: 80 g, Rfl: 0  Allergies  Allergen Reactions   Shellfish Allergy Swelling    Throat swells    I personally reviewed active problem list, medication list, allergies, family history, social history, health maintenance with the patient/caregiver today.   ROS  ***  Objective  There were no vitals filed for this visit.  There is no height or weight on file to calculate BMI.  Physical Exam ***  Recent Results (from the past 2160 hour(s))  Bladder Scan (Post Void Residual) in office     Status: None   Collection Time: 12/21/22 12:12 PM  Result Value Ref Range   Scan Result 0     PHQ2/9:    11/30/2022    9:58 AM 07/05/2022    9:20 AM 01/02/2022    9:31 AM 07/04/2021    9:28 AM  06/27/2021    9:13 AM  Depression screen PHQ 2/9  Decreased Interest 0 0 0 0 0  Down, Depressed, Hopeless 0 0 0 0 0  PHQ - 2 Score 0 0 0 0 0  Altered sleeping  0 0    Tired, decreased energy  0 0    Change in appetite  0 0    Feeling bad or failure about yourself   0 0    Trouble concentrating  0  0    Moving slowly or fidgety/restless  0 0    Suicidal thoughts  0 0    PHQ-9 Score  0 0      phq 9 is {gen pos [id number]   Fall Risk:    11/30/2022    9:56 AM 07/05/2022    9:20 AM 01/02/2022    9:31 AM 07/04/2021    9:27 AM 06/27/2021    9:15 AM  Fall Risk   Falls in the past year? 0 0 0 0 0  Number falls in past yr: 0 0 0 0 0  Injury with Fall? 0 0 0 0 0  Risk for fall due to : No Fall Risks Impaired balance/gait No Fall Risks Impaired balance/gait No Fall Risks  Follow up Education provided;Falls prevention discussed Falls prevention discussed Falls prevention discussed Falls prevention discussed Falls prevention discussed      Functional Status Survey:      Assessment & Plan  *** There are no diagnoses linked to this encounter.

## 2023-01-03 ENCOUNTER — Ambulatory Visit: Payer: Medicare HMO | Admitting: Family Medicine

## 2023-02-11 ENCOUNTER — Other Ambulatory Visit: Payer: Self-pay

## 2023-02-11 DIAGNOSIS — E785 Hyperlipidemia, unspecified: Secondary | ICD-10-CM

## 2023-02-11 DIAGNOSIS — F323 Major depressive disorder, single episode, severe with psychotic features: Secondary | ICD-10-CM

## 2023-02-11 DIAGNOSIS — F01518 Vascular dementia, unspecified severity, with other behavioral disturbance: Secondary | ICD-10-CM

## 2023-02-11 DIAGNOSIS — K219 Gastro-esophageal reflux disease without esophagitis: Secondary | ICD-10-CM

## 2023-02-12 MED ORDER — PRAVASTATIN SODIUM 40 MG PO TABS: 40.0000 mg | ORAL_TABLET | Freq: Every day | ORAL | 1 refills | Status: DC

## 2023-02-12 MED ORDER — CITALOPRAM HYDROBROMIDE 10 MG PO TABS: 10.0000 mg | ORAL_TABLET | Freq: Every day | ORAL | 1 refills | Status: DC

## 2023-02-12 MED ORDER — LANSOPRAZOLE 30 MG PO CPDR: 30.0000 mg | DELAYED_RELEASE_CAPSULE | Freq: Every day | ORAL | 1 refills | Status: DC

## 2023-02-12 MED ORDER — QUETIAPINE FUMARATE 25 MG PO TABS: 25.0000 mg | ORAL_TABLET | Freq: Two times a day (BID) | ORAL | 1 refills | Status: DC

## 2023-04-18 ENCOUNTER — Other Ambulatory Visit: Payer: Self-pay | Admitting: Family Medicine

## 2023-04-18 DIAGNOSIS — N138 Other obstructive and reflux uropathy: Secondary | ICD-10-CM

## 2023-04-18 NOTE — Telephone Encounter (Signed)
Medication Refill - Medication: doxazosin (CARDURA) 8 MG tablet, donepezil (ARICEPT) 5 MG tablet   Antonietta Barcelona, pt. niece, is requesting refills. Explained medications not prescribed by Dr. Carlynn Purl will not be filled. Requested I send request is requesting Rx sent to [facility] and send short supply to local pharmacy.  Has the patient contacted their pharmacy? No. (Agent: If no, request that the patient contact the pharmacy for the refill. If patient does not wish to contact the pharmacy document the reason why and proceed with request.)   Preferred Pharmacy (with phone number or street name):  [id number] [place] [place] - [address]  [address] [place] [address]  Phone: [phone] Fax: [phone]  Hours: Not open 24 hours   [facility] - [place], [place] - [address]  [address] [place] [address]  Phone: [phone] Fax: [phone]  Hours: Not open 24 hours    Has the patient been seen for an appointment in the last year OR does the patient have an upcoming appointment? Yes.    Agent: Please be advised that RX refills may take up to 3 business days. We ask that you follow-up with your pharmacy.

## 2023-04-19 NOTE — Telephone Encounter (Signed)
Requested medications are due for refill today.  Doxazosin no, Aricept unsure  Requested medications are on the active medications list.  yes  Last refill. Doxazosin 10/31/2022 #90 3 rf, Aricept 11/25/2019  Future visit scheduled.   yes  Notes to clinic.  Please see agent notes. Both rx's are signed by someone else. Aricept is listed as historical.    Requested Prescriptions  Pending Prescriptions Disp Refills   doxazosin (CARDURA) 8 MG tablet 90 tablet 3    Sig: Take 1 tablet (8 mg total) by mouth daily.     Cardiovascular:  Alpha Blockers Passed - 04/18/2023 12:22 PM      Passed - Last BP in normal range    BP Readings from Last 1 Encounters:  12/21/22 126/81         Passed - Valid encounter within last 6 months    Recent Outpatient Visits           9 months ago Major depression with psychotic features [facility])   [place] [facility] Alba Cory, MD   1 year ago Centrilobular emphysema [facility])   [place] [facility] Alba Cory, MD   1 year ago Vascular dementia with behavior disturbance [facility])   [place] [facility] Alba Cory, MD   2 years ago Major depression with psychotic features [facility])   [place] [facility] Alba Cory, MD   2 years ago Mild protein-calorie malnutrition [facility])   [place] [facility] Alba Cory, MD       Future Appointments             In 1 month Alba Cory, MD [facility]   In 8 months Stoioff, Verna Czech, MD [facility] [place]             donepezil (ARICEPT) 5 MG tablet 30 tablet 0    Sig: Take 1 tablet (5 mg total) by mouth daily.     Neurology:  Alzheimer's Agents Passed - 04/18/2023 12:22 PM      Passed - Valid encounter within last 6 months    Recent Outpatient Visits           9 months ago Major depression with psychotic features [facility])   [place] [facility]  Alba Cory, MD   1 year ago Centrilobular emphysema [facility])   [place] [facility] Alba Cory, MD   1 year ago Vascular dementia with behavior disturbance [facility])   [place] [facility] Alba Cory, MD   2 years ago Major depression with psychotic features [facility])   [place] [facility] Alba Cory, MD   2 years ago Mild protein-calorie malnutrition [facility])   [facility] Alba Cory, MD       Future Appointments             In 1 month Carlynn Purl, Danna Hefty, MD [facility]   In 8 months Stoioff, Verna Czech, MD [facility] [place]

## 2023-04-19 NOTE — Telephone Encounter (Signed)
Requested medication (s) are due for refill today: Yes  Requested medication (s) are on the active medication list: Yes  Last refill:    Future visit scheduled: Yes  Notes to clinic:  Doxazosin last filled by Dr. Lonna Cobb      Donepezil last filled by Dr. Sherryll Burger.    Requested Prescriptions  Pending Prescriptions Disp Refills   doxazosin (CARDURA) 8 MG tablet 90 tablet 3    Sig: Take 1 tablet (8 mg total) by mouth daily.     Cardiovascular:  Alpha Blockers Passed - 04/18/2023 12:22 PM      Passed - Last BP in normal range    BP Readings from Last 1 Encounters:  12/21/22 126/81         Passed - Valid encounter within last 6 months    Recent Outpatient Visits           9 months ago Major depression with psychotic features [facility])   [place] [facility] Alba Cory, MD   1 year ago Centrilobular emphysema [facility])   [place] [facility] Alba Cory, MD   1 year ago Vascular dementia with behavior disturbance [facility])   [place] [facility] Alba Cory, MD   2 years ago Major depression with psychotic features [facility])   [place] [facility] Alba Cory, MD   2 years ago Mild protein-calorie malnutrition [facility])   [place] [facility] Alba Cory, MD       Future Appointments             In 1 month Alba Cory, MD [facility]   In 8 months Stoioff, Verna Czech, MD [facility] [place]             donepezil (ARICEPT) 5 MG tablet 30 tablet 0    Sig: Take 1 tablet (5 mg total) by mouth daily.     Neurology:  Alzheimer's Agents Passed - 04/18/2023 12:22 PM      Passed - Valid encounter within last 6 months    Recent Outpatient Visits           9 months ago Major depression with psychotic features [facility])   [place] [facility] Alba Cory, MD   1 year ago Centrilobular emphysema [facility])   [place] [facility] Alba Cory, MD   1 year ago Vascular dementia with behavior disturbance [facility])   [place] [facility] Alba Cory, MD   2 years ago Major depression with psychotic features [facility])   [place] [facility] Alba Cory, MD   2 years ago Mild protein-calorie malnutrition [facility])   [facility] Alba Cory, MD       Future Appointments             In 1 month Carlynn Purl, Danna Hefty, MD [facility]   In 8 months Stoioff, Verna Czech, MD [facility] [place]

## 2023-05-14 NOTE — Progress Notes (Unsigned)
Name: Eugene Murphy   MRN: [id number]    DOB: 1932-05-22   Date:05/15/2023       Progress Note  Subjective  Chief Complaint  Follow up  Came in with his wife and daughter Eugene Murphy   HPI  Dementia  with behavior changes/vascular dementia/depression with psychotic features:  his health went down around Summer 2019  he started going to multiple doctors. Had abrupt onset of weight loss   He initially had nausea, weight loss, fatigue. Wife had noticed a change in behavior feeling down, unable to sleep,  pacing all the time, and auditory  hallucinations.   We started him on Citalopram and  Dr. Sherryll Burger added Seroquel and is now doing well, memory has improved, no longer staying up during the night, no longer having hallucinations,controlled with seroquel.  Currently off Remeron and it does not seem like he is taking Aricept, but has been taking  seroquel twice daily . He has an upcoming follow up with Dr. Sherryll Burger . Eugene Murphy feels like he is able to be home alone with his wife, but other family members such as his niece  Eugene Murphy is worried about his ability to stay at home   BPH: he sees Dr. Lonna Cobb, he states nocturia has improved, about twice per night He is taking Cardura    Malnutrition:  He had lost 75 lbs in a period of 2 years, he states appetite is good, eating all meals and still takes protein shakes in am's. His weight was trending up but now weight is going down again , he has been stressed about his wife being admitted to the hospital   Hyperlipidemia: taking medication and denies side effects, no chest pain or palpitation  Last LDL was down to 43, HDL also at goal at 58 . We will recheck labs today    GERD: he is taking prevacid  and seems to control symptoms. He takes a Tums occasionally because he enjoys the taste, no symptoms in a long time   Anemia of chronic diease: no longer seeing Dr. Merlene Pulling, last levels stable, iron storage normal.  We will recheck labs today    OSA: he had lost 75  lbs  in a period of 2  Years and is no longer snoring and Dr. Meredeth Ide has discontinued his CPAP machine. He wakes at night but falls back asleep, not wandering   Asthma mild intermittent/COPD/Emphysema : seeing Dr. 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 Only using albuterol prn, takes singulair for asthma and allergies, also on Flonase prn . No problems at this time    Patient Active Problem List   Diagnosis Date Noted   Mild protein-calorie malnutrition (HCC) 12/28/2020   Major depression with psychotic features (HCC) 12/28/2020   Dementia with behavioral disturbance (HCC) 09/12/2018   Abnormal computed tomography of large intestine    Benign neoplasm of ascending colon    Polyp of sigmoid colon    Lesion of liver 06/17/2018   OSA on CPAP 12/31/2017   GERD (gastroesophageal reflux disease) 07/25/2016   Environmental and seasonal allergies 02/29/2016   Elevated PSA 11/02/2015   Hyperlipidemia 04/13/2015   BPH with obstruction/lower urinary tract symptoms 04/06/2015   Hypersomnia with sleep apnea 12/21/2013   COPD with asthma 12/21/2013    Past Surgical History:  Procedure Laterality Date   COLONOSCOPY WITH PROPOFOL N/A 06/19/2018   Procedure: COLONOSCOPY WITH PROPOFOL WITH  BIOPSIES;  Surgeon: Midge Minium, MD;  Location: [facility];  Service: Endoscopy;  Laterality: N/A;  sleep apnea   COSMETIC SURGERY Left    Left Arm Plastic Surgery in 1970s    Family History  Problem Relation Age of Onset   Congestive Heart Failure Father    Healthy Sister    Dementia Brother     Social History   Tobacco Use   Smoking status: Never   Smokeless tobacco: Never  Substance Use Topics   Alcohol use: Not Currently     Current Outpatient Medications:    Albuterol Sulfate 108 (90 Base) MCG/ACT AEPB, Inhale 2 puffs into the lungs as needed. , Disp: , Rfl:    Ascorbic Acid (VITAMIN C) 1000 MG  tablet, Take 500 mg by mouth daily. , Disp: , Rfl:    Cholecalciferol (VITAMIN D3) 50 MCG (2000 UT) CAPS, Take 1 tablet by mouth daily., Disp: , Rfl:    citalopram (CELEXA) 10 MG tablet, Take 1 tablet (10 mg total) by mouth daily., Disp: 90 tablet, Rfl: 1   doxazosin (CARDURA) 8 MG tablet, TAKE 1 TABLET DAILY, Disp: 90 tablet, Rfl: 3   dutasteride (AVODART) 0.5 MG capsule, Take 1 capsule (0.5 mg total) by mouth daily., Disp: 90 capsule, Rfl: 3   fluticasone (FLONASE) 50 MCG/ACT nasal spray, Place 2 sprays into both nostrils daily., Disp: , Rfl:    lansoprazole (PREVACID) 30 MG capsule, Take 1 capsule (30 mg total) by mouth daily., Disp: 90 capsule, Rfl: 1   levocetirizine (XYZAL) 5 MG tablet, Take 1 tablet (5 mg total) by mouth every evening., Disp: 90 tablet, Rfl: 1   Potassium 99 MG TABS, Take 1 tablet by mouth daily at 8 pm., Disp: , Rfl:    pravastatin (PRAVACHOL) 40 MG tablet, Take 1 tablet (40 mg total) by mouth daily., Disp: 90 tablet, Rfl: 1   QUEtiapine (SEROQUEL) 25 MG tablet, Take 1 tablet (25 mg total) by mouth 2 (two) times daily., Disp: 180 tablet, Rfl: 1   ROCKLATAN 0.02-0.005 % SOLN, Apply 1 drop to eye at bedtime., Disp: , Rfl:    triamcinolone cream (KENALOG) 0.1 %, Apply 1 application topically 2 (two) times daily., Disp: 80 g, Rfl: 0   donepezil (ARICEPT) 5 MG tablet, Take 1 tablet by mouth daily., Disp: , Rfl:   Allergies  Allergen Reactions   Shellfish Allergy Swelling    Throat swells    I personally reviewed active problem list, medication list, allergies, family history with the patient/caregiver today.   ROS  Ten systems reviewed and is negative except as mentioned in HPI    Objective  Vitals:   05/15/23 1540  BP: 118/72  Pulse: 82  Resp: 14  Temp: 98.2 F (36.8 C)  TempSrc: Oral  SpO2: 98%  Weight: 184 lb 3.2 oz (83.6 kg)  Height: 5\' 8"  (1.727 m)    Body mass index is 28.01 kg/m.  Physical Exam  Constitutional: Patient appears well-developed  and malnourished with temporal waisting .  No distress.  HEENT: head atraumatic, normocephalic, pupils equal and reactive to light, neck supple Cardiovascular: Normal rate, regular rhythm and normal heart sounds.  No murmur heard. No BLE edema. Pulmonary/Chest: Effort normal and breath sounds normal. No respiratory distress. Abdominal: Soft.  There is no tenderness. Psychiatric: Patient has a normal mood and affect. behavior is normal. Judgment and thought content normal.   PHQ2/9:    05/15/2023    3:43 PM 11/30/2022    9:58 AM  07/05/2022    9:20 AM 01/02/2022    9:31 AM 07/04/2021    9:28 AM  Depression screen PHQ 2/9  Decreased Interest 0 0 0 0 0  Down, Depressed, Hopeless 0 0 0 0 0  PHQ - 2 Score 0 0 0 0 0  Altered sleeping 0  0 0   Tired, decreased energy 0  0 0   Change in appetite 0  0 0   Feeling bad or failure about yourself  0  0 0   Trouble concentrating 0  0 0   Moving slowly or fidgety/restless 0  0 0   Suicidal thoughts 0  0 0   PHQ-9 Score 0  0 0     phq 9 is negative   Fall Risk:    05/15/2023    3:43 PM 11/30/2022    9:56 AM 07/05/2022    9:20 AM 01/02/2022    9:31 AM 07/04/2021    9:27 AM  Fall Risk   Falls in the past year? 0 0 0 0 0  Number falls in past yr:  0 0 0 0  Injury with Fall?  0 0 0 0  Risk for fall due to : Impaired balance/gait No Fall Risks Impaired balance/gait No Fall Risks Impaired balance/gait  Follow up Falls prevention discussed;Education provided;Falls evaluation completed Education provided;Falls prevention discussed Falls prevention discussed Falls prevention discussed Falls prevention discussed    Functional Status Survey: Is the patient deaf or have difficulty hearing?: Yes Does the patient have difficulty seeing, even when wearing glasses/contacts?: No Does the patient have difficulty concentrating, remembering, or making decisions?: Yes Does the patient have difficulty walking or climbing stairs?: Yes Does the patient have  difficulty dressing or bathing?: No Does the patient have difficulty doing errands alone such as visiting a doctor's office or shopping?: Yes    Assessment & Plan  1. Centrilobular emphysema (HCC)  Sees Dr. Meredeth Ide  2. Mild protein-calorie malnutrition (HCC)  Doing better  3. Vascular dementia with behavior disturbance (HCC)  He needs to bring his medications   4. Major depression with psychotic features (HCC)  Seems to be stable on seroquel BID  5. Gastroesophageal reflux disease without esophagitis  Symptoms are controlled   6. Vitamin D deficiency  - VITAMIN D 25 Hydroxy (Vit-D Deficiency, Fractures)  7. BPH without obstruction/lower urinary tract symptoms  Sees Urologist   8. Long-term use of high-risk medication  - COMPLETE METABOLIC PANEL WITH GFR  9. Anemia of chronic disease  - CBC with Differential/Platelet  10. Dyslipidemia  - Lipid panel - CBC with Differential/Platelet

## 2023-05-15 ENCOUNTER — Encounter: Payer: Self-pay | Admitting: Family Medicine

## 2023-05-15 ENCOUNTER — Ambulatory Visit (INDEPENDENT_AMBULATORY_CARE_PROVIDER_SITE_OTHER): Payer: Medicare HMO | Admitting: Family Medicine

## 2023-05-15 VITALS — BP 118/72 | HR 82 | Temp 98.2°F | Resp 14 | Ht 68.0 in | Wt 184.2 lb

## 2023-05-15 DIAGNOSIS — F01518 Vascular dementia, unspecified severity, with other behavioral disturbance: Secondary | ICD-10-CM | POA: Diagnosis not present

## 2023-05-15 DIAGNOSIS — F323 Major depressive disorder, single episode, severe with psychotic features: Secondary | ICD-10-CM

## 2023-05-15 DIAGNOSIS — E441 Mild protein-calorie malnutrition: Secondary | ICD-10-CM | POA: Diagnosis not present

## 2023-05-15 DIAGNOSIS — E559 Vitamin D deficiency, unspecified: Secondary | ICD-10-CM

## 2023-05-15 DIAGNOSIS — E785 Hyperlipidemia, unspecified: Secondary | ICD-10-CM

## 2023-05-15 DIAGNOSIS — J432 Centrilobular emphysema: Secondary | ICD-10-CM

## 2023-05-15 DIAGNOSIS — Z79899 Other long term (current) drug therapy: Secondary | ICD-10-CM

## 2023-05-15 DIAGNOSIS — D638 Anemia in other chronic diseases classified elsewhere: Secondary | ICD-10-CM

## 2023-05-15 DIAGNOSIS — N400 Enlarged prostate without lower urinary tract symptoms: Secondary | ICD-10-CM

## 2023-05-15 DIAGNOSIS — K219 Gastro-esophageal reflux disease without esophagitis: Secondary | ICD-10-CM

## 2023-05-15 LAB — CBC WITH DIFFERENTIAL/PLATELET
Absolute Monocytes: 790 cells/uL (ref 200–950)
Basophils Absolute: 28 cells/uL (ref 0–200)
Basophils Relative: 0.5 %
Eosinophils Absolute: 308 cells/uL (ref 15–500)
Eosinophils Relative: 5.5 %
HCT: 38.6 % (ref 38.5–50.0)
Hemoglobin: 12.7 g/dL — ABNORMAL LOW (ref 13.2–17.1)
Lymphs Abs: 818 cells/uL — ABNORMAL LOW (ref 850–3900)
MCH: 31 pg (ref 27.0–33.0)
MCHC: 32.9 g/dL (ref 32.0–36.0)
MCV: 94.1 fL (ref 80.0–100.0)
MPV: 11.3 fL (ref 7.5–12.5)
Monocytes Relative: 14.1 %
Neutro Abs: 3657 cells/uL (ref 1500–7800)
Neutrophils Relative %: 65.3 %
Platelets: 195 10*3/uL (ref 140–400)
RBC: 4.1 10*6/uL — ABNORMAL LOW (ref 4.20–5.80)
RDW: 11.8 % (ref 11.0–15.0)
Total Lymphocyte: 14.6 %
WBC: 5.6 10*3/uL (ref 3.8–10.8)

## 2023-05-16 ENCOUNTER — Other Ambulatory Visit: Payer: Self-pay | Admitting: Family Medicine

## 2023-05-16 NOTE — Telephone Encounter (Signed)
Medication Refill - Medication: ROCKLATAN 0.02-0.005 % SOLN   Has the patient contacted their pharmacy? No.  Preferred Pharmacy (with phone number or street name): [id number] [place], [place] - [address] Phone: [phone]  Fax: [phone]   Has the patient been seen for an appointment in the last year OR does the patient have an upcoming appointment? Yes.    Agent: Please be advised that RX refills may take up to 3 business days. We ask that you follow-up with your pharmacy.

## 2023-05-17 NOTE — Telephone Encounter (Signed)
Requested medication (s) are due for refill today - unknown  Requested medication (s) are on the active medication list -yes  Future visit scheduled -yes  Last refill: 05/19/21  Notes to clinic: list as provider not in system- sent for review   Requested Prescriptions  Pending Prescriptions Disp Refills   ROCKLATAN 0.02-0.005 % SOLN      Sig: Apply 1 drop to eye at bedtime.     Off-Protocol Failed - 05/16/2023 10:29 AM      Failed - Medication not assigned to a protocol, review manually.      Passed - Valid encounter within last 12 months    Recent Outpatient Visits           2 days ago Centrilobular emphysema [facility])   [place] [facility] Alba Cory, MD   10 months ago Major depression with psychotic features [facility])   [place] [facility] Alba Cory, MD   1 year ago Centrilobular emphysema [facility])   [place] [facility] Alba Cory, MD   1 year ago Vascular dementia with behavior disturbance [facility])   [place] [facility] Alba Cory, MD   2 years ago Major depression with psychotic features [facility])   [place] [facility] Alba Cory, MD       Future Appointments             In 1 week Alba Cory, MD [facility]   In 7 months Stoioff, Verna Czech, MD [facility] [place]               Requested Prescriptions  Pending Prescriptions Disp Refills   ROCKLATAN 0.02-0.005 % SOLN      Sig: Apply 1 drop to eye at bedtime.     Off-Protocol Failed - 05/16/2023 10:29 AM      Failed - Medication not assigned to a protocol, review manually.      Passed - Valid encounter within last 12 months    Recent Outpatient Visits           2 days ago Centrilobular emphysema [facility])   [place] [facility] Alba Cory, MD   10 months ago Major depression with psychotic features [facility])   [place] [facility] Alba Cory, MD   1 year ago Centrilobular emphysema [facility])   [place] [facility] Alba Cory, MD   1 year ago Vascular dementia with behavior disturbance [facility])   [place] [facility] Alba Cory, MD   2 years ago Major depression with psychotic features [facility])   [place] [facility] Alba Cory, MD       Future Appointments             In 1 week Alba Cory, MD [facility]   In 7 months Stoioff, Verna Czech, MD [facility] [place]

## 2023-05-27 ENCOUNTER — Ambulatory Visit: Payer: Medicare HMO | Admitting: Family Medicine

## 2023-05-29 NOTE — Progress Notes (Unsigned)
Name: Eugene Murphy   MRN: [id number]    DOB: [birth year]   Date:05/30/2023       Progress Note  Subjective  Chief Complaint  Follow Up  He came in with his daughter Aram Beecham and his wife   HPI   Malnutrition:  He had lost 75 lbs in a period of 2 years, he states appetite ihad improved and was gaining weight, however  he has been worried about his wife since her admission to [facility], he has noticed increase in heartburn and is eating less to prevent indigestion . We will add Pepcid at night   Drop in GFR: he is trying to drink more fluids and we will recheck level today   Patient Active Problem List   Diagnosis Date Noted   Mild protein-calorie malnutrition (HCC) 12/28/2020   Major depression with psychotic features (HCC) 12/28/2020   Dementia with behavioral disturbance (HCC) 09/12/2018   Abnormal computed tomography of large intestine    Benign neoplasm of ascending colon    Polyp of sigmoid colon    Lesion of liver 06/17/2018   OSA on CPAP 12/31/2017   GERD (gastroesophageal reflux disease) 07/25/2016   Environmental and seasonal allergies 02/29/2016   Elevated PSA 11/02/2015   Hyperlipidemia 04/13/2015   BPH with obstruction/lower urinary tract symptoms 04/06/2015   Hypersomnia with sleep apnea 12/21/2013   COPD with asthma 12/21/2013    Past Surgical History:  Procedure Laterality Date   COLONOSCOPY WITH PROPOFOL N/A 06/19/2018   Procedure: COLONOSCOPY WITH PROPOFOL WITH BIOPSIES;  Surgeon: Midge Minium, MD;  Location: [facility];  Service: Endoscopy;  Laterality: N/A;  sleep apnea   COSMETIC SURGERY Left    Left Arm Plastic Surgery in 1970s    Family History  Problem Relation Age of Onset   Congestive Heart Failure Father    Healthy Sister    Dementia Brother     Social History   Tobacco Use   Smoking status: Never   Smokeless tobacco: Never  Substance Use Topics   Alcohol use: Not Currently     Current Outpatient Medications:    carvedilol  (COREG) 12.5 MG tablet, Take 12.5 mg by mouth 2 (two) times daily with a meal., Disp: , Rfl:    Cholecalciferol (VITAMIN D3) 50 MCG (2000 UT) CAPS, Take 1 tablet by mouth daily., Disp: , Rfl:    citalopram (CELEXA) 10 MG tablet, Take 1 tablet (10 mg total) by mouth daily., Disp: 90 tablet, Rfl: 1   donepezil (ARICEPT) 5 MG tablet, Take 5 mg by mouth at bedtime., Disp: , Rfl:    doxazosin (CARDURA) 8 MG tablet, TAKE 1 TABLET DAILY, Disp: 90 tablet, Rfl: 3   dutasteride (AVODART) 0.5 MG capsule, Take 1 capsule (0.5 mg total) by mouth daily., Disp: 90 capsule, Rfl: 3   famotidine (PEPCID) 40 MG tablet, Take 1 tablet (40 mg total) by mouth at bedtime. For heartburn, Disp: 90 tablet, Rfl: 0   lansoprazole (PREVACID) 30 MG capsule, Take 1 capsule (30 mg total) by mouth daily., Disp: 90 capsule, Rfl: 1   levocetirizine (XYZAL) 5 MG tablet, Take 1 tablet (5 mg total) by mouth every evening., Disp: 90 tablet, Rfl: 1   pravastatin (PRAVACHOL) 40 MG tablet, Take 1 tablet (40 mg total) by mouth daily., Disp: 90 tablet, Rfl: 1   QUEtiapine (SEROQUEL) 25 MG tablet, Take 1 tablet (25 mg total) by mouth 2 (two) times daily., Disp: 180 tablet, Rfl: 1   ROCKLATAN 0.02-0.005 % SOLN, Apply  1 drop to eye at bedtime., Disp: , Rfl:    sacubitril-valsartan (ENTRESTO) 49-51 MG, Take 1 tablet by mouth 2 (two) times daily., Disp: , Rfl:    torsemide (DEMADEX) 20 MG tablet, Take 20 mg by mouth daily., Disp: , Rfl:   Allergies  Allergen Reactions   Shellfish Allergy Swelling    Throat swells    I personally reviewed active problem list, medication list, allergies, family history, social history, health maintenance with the patient/caregiver today.   ROS  Ten systems reviewed and is negative except as mentioned in HPI    Objective  Vitals:   05/30/23 1145  BP: 116/70  Pulse: 78  Resp: 16  Temp: 97.9 F (36.6 C)  TempSrc: Oral  SpO2: 96%  Weight: 181 lb 4.8 oz (82.2 kg)  Height: 5\' 8"  (1.727 m)    Body  mass index is 27.57 kg/m.  Physical Exam  Constitutional: Patient appears well-developed and malnourished/temporal waisting No distress.  HEENT: head atraumatic, normocephalic, pupils equal and reactive to light, neck supple Cardiovascular: Normal rate, regular rhythm and normal heart sounds.  No murmur heard. No BLE edema. Pulmonary/Chest: Effort normal and breath sounds normal. No respiratory distress. Abdominal: Soft.  There is no tenderness. Psychiatric: he got agitated and interrupted my conversation with his wife   PHQ2/9:    05/30/2023   10:59 AM 05/15/2023    3:43 PM 11/30/2022    9:58 AM 07/05/2022    9:20 AM 01/02/2022    9:31 AM  Depression screen PHQ 2/9  Decreased Interest 0 0 0 0 0  Down, Depressed, Hopeless 0 0 0 0 0  PHQ - 2 Score 0 0 0 0 0  Altered sleeping  0  0 0  Tired, decreased energy  0  0 0  Change in appetite  0  0 0  Feeling bad or failure about yourself   0  0 0  Trouble concentrating  0  0 0  Moving slowly or fidgety/restless  0  0 0  Suicidal thoughts  0  0 0  PHQ-9 Score  0  0 0    phq 9 is negative   Fall Risk:    05/30/2023   10:59 AM 05/15/2023    3:43 PM 11/30/2022    9:56 AM 07/05/2022    9:20 AM 01/02/2022    9:31 AM  Fall Risk   Falls in the past year? 0 0 0 0 0  Number falls in past yr:   0 0 0  Injury with Fall?   0 0 0  Risk for fall due to : Impaired balance/gait Impaired balance/gait No Fall Risks Impaired balance/gait No Fall Risks  Follow up Falls prevention discussed Falls prevention discussed;Education provided;Falls evaluation completed Education provided;Falls prevention discussed Falls prevention discussed Falls prevention discussed      Functional Status Survey: Is the patient deaf or have difficulty hearing?: Yes Does the patient have difficulty seeing, even when wearing glasses/contacts?: No Does the patient have difficulty concentrating, remembering, or making decisions?: Yes Does the patient have difficulty walking  or climbing stairs?: Yes Does the patient have difficulty dressing or bathing?: No Does the patient have difficulty doing errands alone such as visiting a doctor's office or shopping?: Yes    Assessment & Plan  1. Decreased GFR  - BASIC METABOLIC PANEL WITH GFR  2. Mild protein-calorie malnutrition (HCC)  Continue frequent meals, we will try to improve GERD symptoms   3. Gastroesophageal reflux disease without esophagitis  -  famotidine (PEPCID) 40 MG tablet; Take 1 tablet (40 mg total) by mouth at bedtime. For heartburn  Dispense: 90 tablet; Refill: 0

## 2023-05-30 ENCOUNTER — Ambulatory Visit (INDEPENDENT_AMBULATORY_CARE_PROVIDER_SITE_OTHER): Payer: Medicare HMO | Admitting: Family Medicine

## 2023-05-30 ENCOUNTER — Encounter: Payer: Self-pay | Admitting: Family Medicine

## 2023-05-30 VITALS — BP 116/70 | HR 78 | Temp 97.9°F | Resp 16 | Ht 68.0 in | Wt 181.3 lb

## 2023-05-30 DIAGNOSIS — E441 Mild protein-calorie malnutrition: Secondary | ICD-10-CM | POA: Diagnosis not present

## 2023-05-30 DIAGNOSIS — K219 Gastro-esophageal reflux disease without esophagitis: Secondary | ICD-10-CM | POA: Diagnosis not present

## 2023-05-30 DIAGNOSIS — R944 Abnormal results of kidney function studies: Secondary | ICD-10-CM | POA: Diagnosis not present

## 2023-05-30 MED ORDER — FAMOTIDINE 40 MG PO TABS: 40.0000 mg | ORAL_TABLET | Freq: Every day | ORAL | 0 refills | Status: DC

## 2023-05-31 LAB — BASIC METABOLIC PANEL WITH GFR
BUN: 25 mg/dL (ref 7–25)
CO2: 28 mmol/L (ref 20–32)
Calcium: 9.8 mg/dL (ref 8.6–10.3)
Chloride: 101 mmol/L (ref 98–110)
Creat: 1.04 mg/dL (ref 0.70–1.22)
Glucose, Bld: 78 mg/dL (ref 65–99)
Potassium: 4.5 mmol/L (ref 3.5–5.3)
Sodium: 137 mmol/L (ref 135–146)
eGFR: 69 mL/min/{1.73_m2} (ref >=60)

## 2023-06-06 ENCOUNTER — Ambulatory Visit: Payer: Self-pay | Admitting: *Deleted

## 2023-06-06 ENCOUNTER — Other Ambulatory Visit: Payer: Self-pay | Admitting: Family Medicine

## 2023-06-06 DIAGNOSIS — F01518 Vascular dementia, unspecified severity, with other behavioral disturbance: Secondary | ICD-10-CM

## 2023-06-06 MED ORDER — DONEPEZIL HCL 5 MG PO TABS: 5.0000 mg | ORAL_TABLET | Freq: Every day | ORAL | 1 refills | Status: DC

## 2023-06-06 NOTE — Telephone Encounter (Signed)
NP with [facility] Miachel Roux, NP is in home evaluating pt for the insurance co.    Aram Beecham, daughter is taking over the care of his medications.   Noralee Chars is trying to clarify what medications he is supposed to be on.   Some of the medications are not in the home.  He has 3 medications on his summary list.    Family said he is on Seroquel, Entresto and Demadex  But he does  not have these in the home.   No bottles physically there where he is taking these medications.      Is he supposed to be on Seroquel, Entrusto and Demadex?      The Aricept has run out.   [facility] and spoke with them this morning.  The family wants him back on it per [place].    He is out of refills of the Seroquel.   Does Dr. Carlynn Purl want him to continue the Seroquel?    If so [facility]  Does Dr. Carlynn Purl want him on the Prevacid and Pepcid?  He does not have Entresto in the home.   [facility] [place] [facility] not [facility] the Entresto neither [facility].   Does Dr. Carlynn Purl want him on Entresto?    Demadex is also not in the home.  Who prescribes this and is he supposed to still be on it?   Information was sent to Dr. Carlynn Purl.   Family asked that the daughter Aram Beecham be contacted at [phone].   She will be taking over the care of his medications.   Reason for Disposition  [1] Caller has URGENT medicine question about med that PCP or specialist prescribed AND [2] triager unable to answer question  Answer Assessment - Initial Assessment Questions 1. NAME of MEDICINE: "What medicine(s) are you calling about?"     Went over all his medications with Miachel Roux, NP from [facility].   She is there on behalf of his insurance co. 2. QUESTION: "What is your question?" (e.g., double dose of medicine, side effect)     See documentation 3. PRESCRIBER: "Who prescribed the medicine?" Reason: if prescribed by specialist, call should be  referred to that group.     Some of the drugs are from Dr. Carlynn Purl other they are not sure about. 4. SYMPTOMS: "Do you have any symptoms?" If Yes, ask: "What symptoms are you having?"  "How bad are the symptoms (e.g., mild, moderate, severe)     N/A 5. PREGNANCY:  "Is there any chance that you are pregnant?" "When was your last menstrual period?"     N/A  I went over his medication list with [place].   See notes with questions for Dr. Carlynn Purl.    Please contact his daughter Aram Beecham who will be taking over the care of his medications.   Pt. Has dementia.    Aram Beecham can be reached at [phone].  Protocols used: Medication Question Call-A-AH

## 2023-06-06 NOTE — Telephone Encounter (Signed)
Updated at [facility] appt for the 2nd time  Needs to call cardio and was given Phone# to get refills on entrseto and demedex.  Aricept refill called in

## 2023-06-06 NOTE — Telephone Encounter (Signed)
  Chief Complaint: Medication clarification needed on what medications he is supposed to be on.   See documentation notes for details. Symptoms: N/A Frequency: N/A Pertinent Negatives: Patient denies N/A Disposition: [] ED /[] Urgent Care (no appt availability in office) / [] Appointment(In office/virtual)/ []  [place] [facility]/ [] Home Care/ [] Refused Recommended Disposition /[] [place] [facility]/ [x]  Follow-up with PCP Additional Notes: Message has been sent to Dr. Carlynn Purl for medication clarification.

## 2023-06-14 ENCOUNTER — Telehealth: Payer: Self-pay | Admitting: Family Medicine

## 2023-06-14 NOTE — Telephone Encounter (Signed)
Pt has an appt on 07/25/23

## 2023-06-14 NOTE — Telephone Encounter (Signed)
Eugene Murphy from [facility] called stated a Home Health assessment was done on 8/29 and a PDA test performed, they found the left side was normal and the right was severe. They are asking that patient have a f/u visit.

## 2023-06-27 ENCOUNTER — Telehealth: Payer: Self-pay | Admitting: Family Medicine

## 2023-06-27 NOTE — Telephone Encounter (Signed)
Eugene Murphy is calling because pt wants to inform his pcp that his preferred pharmacy is [facility] for medications sent in larger supplies.

## 2023-07-11 ENCOUNTER — Telehealth: Payer: Self-pay | Admitting: Family Medicine

## 2023-07-11 NOTE — Telephone Encounter (Signed)
Copied from CRM [id number]. Topic: General - Inquiry >> Jul 11, 2023  1:46 PM Haroldine Laws wrote: Reason for CRM: Shanda Bumps with [facility] called Isaiah Serge and asked if she could call her back at  [phone]

## 2023-07-12 NOTE — Telephone Encounter (Signed)
Returned call and faxed information as requested to [place]

## 2023-07-24 NOTE — Progress Notes (Unsigned)
Name: Eugene Murphy   MRN: [id number]    DOB: 07/03/1932   Date:07/25/2023       Progress Note  Subjective  Chief Complaint  Follow Up  HPI  Malnutrition:  He had lost 75 lbs in a period of 2 years, he states appetite had improved and was gaining weight, however  he was not eating when wife was admitted to [facility] and having medical problems, he was also having heartburn and indigestion, we gave him Pepcid and he is doing better now. His weight is up 8 lbs since last visit   Drop in GFR: it went down to 43 but he increased fluid intake and lab normalized  Dementia: under the care of Dr. Sherryll Burger and is compliant on medication. His daughter is helping with medication since his wife's health declined this Summer . He is getting PT and OT sent by Dr. Sherryll Burger   Patient Active Problem List   Diagnosis Date Noted   Mild protein-calorie malnutrition (HCC) 12/28/2020   Major depression with psychotic features (HCC) 12/28/2020   Dementia with behavioral disturbance (HCC) 09/12/2018   Abnormal computed tomography of large intestine    Benign neoplasm of ascending colon    Polyp of sigmoid colon    Lesion of liver 06/17/2018   OSA on CPAP 12/31/2017   GERD (gastroesophageal reflux disease) 07/25/2016   Environmental and seasonal allergies 02/29/2016   Elevated PSA 11/02/2015   Hyperlipidemia 04/13/2015   BPH with obstruction/lower urinary tract symptoms 04/06/2015   Hypersomnia with sleep apnea 12/21/2013   COPD with asthma (HCC) 12/21/2013    Past Surgical History:  Procedure Laterality Date   COLONOSCOPY WITH PROPOFOL N/A 06/19/2018   Procedure: COLONOSCOPY WITH PROPOFOL WITH BIOPSIES;  Surgeon: Midge Minium, MD;  Location: [facility];  Service: Endoscopy;  Laterality: N/A;  sleep apnea   COSMETIC SURGERY Left    Left Arm Plastic Surgery in 1970s    Family History  Problem Relation Age of Onset   Congestive Heart Failure Father    Healthy Sister    Dementia Brother      Social History   Tobacco Use   Smoking status: Never   Smokeless tobacco: Never  Substance Use Topics   Alcohol use: Not Currently     Current Outpatient Medications:    citalopram (CELEXA) 10 MG tablet, Take 1 tablet (10 mg total) by mouth daily., Disp: 90 tablet, Rfl: 1   donepezil (ARICEPT) 5 MG tablet, Take 1 tablet (5 mg total) by mouth at bedtime., Disp: 90 tablet, Rfl: 1   dutasteride (AVODART) 0.5 MG capsule, Take 1 capsule (0.5 mg total) by mouth daily., Disp: 90 capsule, Rfl: 3   famotidine (PEPCID) 40 MG tablet, Take 1 tablet (40 mg total) by mouth at bedtime. For heartburn, Disp: 90 tablet, Rfl: 0   lansoprazole (PREVACID) 30 MG capsule, Take 1 capsule (30 mg total) by mouth daily., Disp: 90 capsule, Rfl: 1   levocetirizine (XYZAL) 5 MG tablet, Take 1 tablet (5 mg total) by mouth every evening., Disp: 90 tablet, Rfl: 1   Melatonin 10 MG TABS, Take by mouth., Disp: , Rfl:    pravastatin (PRAVACHOL) 40 MG tablet, Take 1 tablet (40 mg total) by mouth daily., Disp: 90 tablet, Rfl: 1   ROCKLATAN 0.02-0.005 % SOLN, Apply 1 drop to eye at bedtime., Disp: , Rfl:   Allergies  Allergen Reactions   Shellfish Allergy Swelling    Throat swells    I personally reviewed active problem  list, medication list, allergies, family history, social history, health maintenance with the patient/caregiver today.   ROS  Ten systems reviewed and is negative except as mentioned in HPI    Objective  Vitals:   07/25/23 1144  BP: 118/72  Pulse: 78  Resp: 16  Temp: 97.8 F (36.6 C)  TempSrc: Oral  SpO2: 98%  Weight: 189 lb 1.6 oz (85.8 kg)  Height: 5\' 8"  (1.727 m)    Body mass index is 28.75 kg/m.  Physical Exam  Constitutional: Patient appears well-developed and well-nourished.  No distress.  HEENT: head atraumatic, normocephalic, pupils equal and reactive to light, neck supple Cardiovascular: Normal rate, regular rhythm and normal heart sounds.  No murmur heard. No BLE  edema. Pulmonary/Chest: Effort normal and breath sounds normal. No respiratory distress. Abdominal: Soft.  There is no tenderness. Psychiatric: Patient has a normal mood and affect. behavior is normal. Judgment and thought content normal.    PHQ2/9:    05/30/2023   10:59 AM 05/15/2023    3:43 PM 11/30/2022    9:58 AM 07/05/2022    9:20 AM 01/02/2022    9:31 AM  Depression screen PHQ 2/9  Decreased Interest 0 0 0 0 0  Down, Depressed, Hopeless 0 0 0 0 0  PHQ - 2 Score 0 0 0 0 0  Altered sleeping  0  0 0  Tired, decreased energy  0  0 0  Change in appetite  0  0 0  Feeling bad or failure about yourself   0  0 0  Trouble concentrating  0  0 0  Moving slowly or fidgety/restless  0  0 0  Suicidal thoughts  0  0 0  PHQ-9 Score  0  0 0    phq 9 is negative   Fall Risk:    07/25/2023   11:45 AM 05/30/2023   10:59 AM 05/15/2023    3:43 PM 11/30/2022    9:56 AM 07/05/2022    9:20 AM  Fall Risk   Falls in the past year? 0 0 0 0 0  Number falls in past yr:    0 0  Injury with Fall?    0 0  Risk for fall due to : Impaired balance/gait Impaired balance/gait Impaired balance/gait No Fall Risks Impaired balance/gait  Follow up Falls prevention discussed Falls prevention discussed Falls prevention discussed;Education provided;Falls evaluation completed Education provided;Falls prevention discussed Falls prevention discussed     Assessment & Plan  1. Vascular dementia with behavior disturbance (HCC)  He is more calm today, but still frustrated DSS called on his wife  2. Mild protein-calorie malnutrition (HCC)  Eating more , gaining weight   3. Decreased GFR  Normalized, reviewed labs with patient and daughter  4. Need for immunization against influenza  - Flu Vaccine Trivalent High Dose (Fluad)

## 2023-07-25 ENCOUNTER — Ambulatory Visit (INDEPENDENT_AMBULATORY_CARE_PROVIDER_SITE_OTHER): Payer: Medicare HMO | Admitting: Family Medicine

## 2023-07-25 ENCOUNTER — Encounter: Payer: Self-pay | Admitting: Family Medicine

## 2023-07-25 VITALS — BP 118/72 | HR 78 | Temp 97.8°F | Resp 16 | Ht 68.0 in | Wt 189.1 lb

## 2023-07-25 DIAGNOSIS — Z23 Encounter for immunization: Secondary | ICD-10-CM

## 2023-07-25 DIAGNOSIS — F01518 Vascular dementia, unspecified severity, with other behavioral disturbance: Secondary | ICD-10-CM | POA: Diagnosis not present

## 2023-07-25 DIAGNOSIS — R944 Abnormal results of kidney function studies: Secondary | ICD-10-CM | POA: Diagnosis not present

## 2023-07-25 DIAGNOSIS — E441 Mild protein-calorie malnutrition: Secondary | ICD-10-CM | POA: Diagnosis not present

## 2023-08-16 ENCOUNTER — Ambulatory Visit: Payer: Medicare HMO | Admitting: Family Medicine

## 2023-09-16 ENCOUNTER — Telehealth: Payer: Self-pay | Admitting: Family Medicine

## 2023-09-16 NOTE — Telephone Encounter (Signed)
Flora wife stopped by and would like advise or help with husband. States he is relapsing and she would like to place him in a group home. Would like for him to be placed in home that is in the [place] and/or [place] area. Pt is willing to see another provider if appt is needed due to dr Carlynn Purl being booked.

## 2023-09-16 NOTE — Telephone Encounter (Signed)
Appt sch'd with Denny Peon Mecum for 09-20-23

## 2023-09-16 NOTE — Telephone Encounter (Signed)
Pt needs appt

## 2023-09-20 ENCOUNTER — Ambulatory Visit (INDEPENDENT_AMBULATORY_CARE_PROVIDER_SITE_OTHER): Payer: Medicare HMO | Admitting: Physician Assistant

## 2023-09-20 VITALS — BP 140/76 | HR 92 | Resp 16 | Ht 68.0 in | Wt 189.0 lb

## 2023-09-20 DIAGNOSIS — F323 Major depressive disorder, single episode, severe with psychotic features: Secondary | ICD-10-CM | POA: Diagnosis not present

## 2023-09-20 DIAGNOSIS — F03918 Unspecified dementia, unspecified severity, with other behavioral disturbance: Secondary | ICD-10-CM

## 2023-09-20 DIAGNOSIS — E441 Mild protein-calorie malnutrition: Secondary | ICD-10-CM | POA: Diagnosis not present

## 2023-09-20 MED ORDER — DONEPEZIL HCL 5 MG PO TABS
5.0000 mg | ORAL_TABLET | Freq: Every day | ORAL | 0 refills | Status: DC
Start: 2023-09-20 — End: 2023-10-07

## 2023-09-20 NOTE — Patient Instructions (Addendum)
I recommend keeping a log of concerning symptoms and activities for Eugene Murphy. (This includes increased confusion, trouble sleeping, restlessness, aggression, yelling, emotional outbursts)   This can help [place] with scheduling home health services and if we need to rethink medication regimens  I have attached a few medications for y'all to review to see if these would help with some of your concerns

## 2023-09-20 NOTE — Assessment & Plan Note (Signed)
Chronic, ongoing Patient is present with his wife and niece who are concerned for nighttime behavior disturbance, agitation, restlessness Patient is currently taking donepezil 5 mg p.o. nightly and appears to be tolerating well His niece reports that a significant amount of his donepezil was dropped and they will likely be short for the month of December before the next medication shipment comes out.  They are requesting a bridge prescription sent to local pharmacy Refill sent per request Will also place home health orders for home health aide to assist with patient management Recommend follow-up in 4 weeks or sooner if concerns arise

## 2023-09-20 NOTE — Progress Notes (Signed)
Established Patient Office Visit  Name: Eugene Murphy   MRN: [id number]    DOB: [birth year]   Date:09/20/2023  Today's Provider: Jacquelin Hawking, MHS, PA-C Introduced myself to the patient as a PA-C and provided education on APPs in clinical practice.         Subjective  Chief Complaint  Chief Complaint  Patient presents with   Consult    Discuss group home orders and relapsing memory    HPI  He is here with wife and niece   Niece is concerned for Aricept side effects and continued use  They are concerned for this as they dropped some and he will be short on medication for the month of Dec  They would like a bridge prescription sent into local pharmacy so that he can continue medication regimen as directed  Patient's wife reports ongoing concerns for behavior changes, caregiver stress in regards to patient.    Patient Active Problem List   Diagnosis Date Noted   Mild protein-calorie malnutrition (HCC) 12/28/2020   Major depression with psychotic features (HCC) 12/28/2020   Dementia with behavioral disturbance (HCC) 09/12/2018   Abnormal computed tomography of large intestine    Benign neoplasm of ascending colon    Polyp of sigmoid colon    Lesion of liver 06/17/2018   OSA on CPAP 12/31/2017   GERD (gastroesophageal reflux disease) 07/25/2016   Environmental and seasonal allergies 02/29/2016   Elevated PSA 11/02/2015   Hyperlipidemia 04/13/2015   BPH with obstruction/lower urinary tract symptoms 04/06/2015   Hypersomnia with sleep apnea 12/21/2013   COPD with asthma (HCC) 12/21/2013    Past Surgical History:  Procedure Laterality Date   COLONOSCOPY WITH PROPOFOL N/A 06/19/2018   Procedure: COLONOSCOPY WITH PROPOFOL WITH BIOPSIES;  Surgeon: Midge Minium, MD;  Location: [facility];  Service: Endoscopy;  Laterality: N/A;  sleep apnea   COSMETIC SURGERY Left    Left Arm Plastic Surgery in 1970s    Family History  Problem Relation Age of Onset    Congestive Heart Failure Father    Healthy Sister    Dementia Brother     Social History   Tobacco Use   Smoking status: Never   Smokeless tobacco: Never  Substance Use Topics   Alcohol use: Not Currently     Current Outpatient Medications:    citalopram (CELEXA) 10 MG tablet, Take 1 tablet (10 mg total) by mouth daily., Disp: 90 tablet, Rfl: 1   dutasteride (AVODART) 0.5 MG capsule, Take 1 capsule (0.5 mg total) by mouth daily., Disp: 90 capsule, Rfl: 3   famotidine (PEPCID) 40 MG tablet, Take 1 tablet (40 mg total) by mouth at bedtime. For heartburn, Disp: 90 tablet, Rfl: 0   lansoprazole (PREVACID) 30 MG capsule, Take 1 capsule (30 mg total) by mouth daily., Disp: 90 capsule, Rfl: 1   levocetirizine (XYZAL) 5 MG tablet, Take 1 tablet (5 mg total) by mouth every evening., Disp: 90 tablet, Rfl: 1   Melatonin 10 MG TABS, Take by mouth., Disp: , Rfl:    pravastatin (PRAVACHOL) 40 MG tablet, Take 1 tablet (40 mg total) by mouth daily., Disp: 90 tablet, Rfl: 1   ROCKLATAN 0.02-0.005 % SOLN, Apply 1 drop to eye at bedtime., Disp: , Rfl:    donepezil (ARICEPT) 5 MG tablet, Take 1 tablet (5 mg total) by mouth at bedtime., Disp: 15 tablet, Rfl: 0  Allergies  Allergen Reactions   Shellfish Allergy Swelling  Throat swells    Review of Systems  Neurological:  Negative for loss of consciousness.  Psychiatric/Behavioral:  Positive for memory loss. Negative for depression. The patient is not nervous/anxious.       Objective  Vitals:   09/20/23 1106  BP: (!) 140/76  Pulse: 92  Resp: 16  SpO2: 95%  Weight: 189 lb (85.7 kg)  Height: 5\' 8"  (1.727 m)    Body mass index is 28.74 kg/m.  Physical Exam Vitals reviewed.  Constitutional:      Appearance: Normal appearance.  HENT:     Head: Normocephalic and atraumatic.  Eyes:     Extraocular Movements: Extraocular movements intact.     Conjunctiva/sclera: Conjunctivae normal.  Pulmonary:     Effort: Pulmonary effort is  normal.  Musculoskeletal:     Cervical back: Normal range of motion.  Neurological:     General: No focal deficit present.     Mental Status: He is alert.  Psychiatric:        Mood and Affect: Mood normal.        Behavior: Behavior normal.      No results found for this or any previous visit (from the past 2160 hours).   PHQ2/9:    05/30/2023   10:59 AM 05/15/2023    3:43 PM 11/30/2022    9:58 AM 07/05/2022    9:20 AM 01/02/2022    9:31 AM  Depression screen PHQ 2/9  Decreased Interest 0 0 0 0 0  Down, Depressed, Hopeless 0 0 0 0 0  PHQ - 2 Score 0 0 0 0 0  Altered sleeping  0  0 0  Tired, decreased energy  0  0 0  Change in appetite  0  0 0  Feeling bad or failure about yourself   0  0 0  Trouble concentrating  0  0 0  Moving slowly or fidgety/restless  0  0 0  Suicidal thoughts  0  0 0  PHQ-9 Score  0  0 0      Fall Risk:    07/25/2023   11:45 AM 05/30/2023   10:59 AM 05/15/2023    3:43 PM 11/30/2022    9:56 AM 07/05/2022    9:20 AM  Fall Risk   Falls in the past year? 0 0 0 0 0  Number falls in past yr:    0 0  Injury with Fall?    0 0  Risk for fall due to : Impaired balance/gait Impaired balance/gait Impaired balance/gait No Fall Risks Impaired balance/gait  Follow up Falls prevention discussed Falls prevention discussed Falls prevention discussed;Education provided;Falls evaluation completed Education provided;Falls prevention discussed Falls prevention discussed      Functional Status Survey:      Assessment & Plan  Problem List Items Addressed This Visit       Nervous and Auditory   Dementia with behavioral disturbance (HCC) - Primary   Chronic, ongoing Patient is present with his wife and niece who are concerned for nighttime behavior disturbance, agitation, restlessness Patient is currently taking donepezil 5 mg p.o. nightly and appears to be tolerating well His niece reports that a significant amount of his donepezil was dropped and they will  likely be short for the month of December before the next medication shipment comes out.  They are requesting a bridge prescription sent to local pharmacy Refill sent per request Will also place home health orders for home health aide to assist with patient management Recommend  follow-up in 4 weeks or sooner if concerns arise      Relevant Medications   donepezil (ARICEPT) 5 MG tablet   Other Relevant Orders   Ambulatory referral to Home Health     Other   Mild protein-calorie malnutrition (HCC)   Relevant Orders   Ambulatory referral to Home Health   Major depression with psychotic features [facility])   Relevant Orders   Ambulatory referral to Home Health     Return in about 4 weeks (around 10/18/2023) for Dementia- with PCP .   I, [place] Kielbasa E Sherie Dobrowolski, PA-C, have reviewed all documentation for this visit. The documentation on 09/20/23 for the exam, diagnosis, procedures, and orders are all accurate and complete.   Jacquelin Hawking, MHS, PA-C [facility]

## 2023-09-24 ENCOUNTER — Other Ambulatory Visit: Payer: Self-pay

## 2023-09-24 DIAGNOSIS — R269 Unspecified abnormalities of gait and mobility: Secondary | ICD-10-CM

## 2023-09-24 DIAGNOSIS — F03918 Unspecified dementia, unspecified severity, with other behavioral disturbance: Secondary | ICD-10-CM

## 2023-09-27 ENCOUNTER — Telehealth: Payer: Self-pay | Admitting: Family Medicine

## 2023-09-27 NOTE — Telephone Encounter (Signed)
Called Rhonda back and notified Dr.Sowles is aware

## 2023-09-27 NOTE — Telephone Encounter (Signed)
POA Eugene Murphy does not want nursing home health but she does want PT, they will send a PT on Monday  Erline Levine from [facility]  Best contact: [phone]

## 2023-09-30 ENCOUNTER — Other Ambulatory Visit: Payer: Self-pay | Admitting: Physician Assistant

## 2023-09-30 DIAGNOSIS — F03918 Unspecified dementia, unspecified severity, with other behavioral disturbance: Secondary | ICD-10-CM

## 2023-10-01 NOTE — Telephone Encounter (Signed)
90-day supply not appropriate. 15 tabs given as a bridge due to medication being dropped. Requested Prescriptions  Pending Prescriptions Disp Refills   donepezil (ARICEPT) 5 MG tablet [Pharmacy Med Name: DONEPEZIL HCL 5 MG TABLET] 90 tablet 1    Sig: TAKE 1 TABLET BY MOUTH EVERYDAY AT BEDTIME     Neurology:  Alzheimer's Agents Passed - 10/01/2023 11:53 AM      Passed - Valid encounter within last 6 months    Recent Outpatient Visits           1 week ago Dementia with behavioral disturbance [facility])   [place] [facility] Mecum, Oswaldo Conroy, PA-C   2 months ago Vascular dementia with behavior disturbance [facility])   [facility] Alba Cory, MD   4 months ago Decreased GFR   [facility] Alba Cory, MD   4 months ago Centrilobular emphysema [facility])   [place] [facility] Alba Cory, MD   1 year ago Major depression with psychotic features [facility])   [facility] [facility] Alba Cory, MD       Future Appointments             In 2 weeks Carlynn Purl, Danna Hefty, MD [facility]   In 2 months Stoioff, Verna Czech, MD [facility] 

## 2023-10-07 ENCOUNTER — Other Ambulatory Visit: Payer: Self-pay | Admitting: Family Medicine

## 2023-10-07 DIAGNOSIS — F03918 Unspecified dementia, unspecified severity, with other behavioral disturbance: Secondary | ICD-10-CM

## 2023-10-07 MED ORDER — DONEPEZIL HCL 5 MG PO TABS
5.0000 mg | ORAL_TABLET | Freq: Every day | ORAL | 0 refills | Status: DC
Start: 2023-10-07 — End: 2024-02-04

## 2023-10-21 ENCOUNTER — Ambulatory Visit (INDEPENDENT_AMBULATORY_CARE_PROVIDER_SITE_OTHER): Payer: Medicare HMO | Admitting: Family Medicine

## 2023-10-21 ENCOUNTER — Encounter: Payer: Self-pay | Admitting: Family Medicine

## 2023-10-21 VITALS — BP 134/72 | HR 98 | Resp 18 | Ht 68.0 in | Wt 198.1 lb

## 2023-10-21 DIAGNOSIS — F323 Major depressive disorder, single episode, severe with psychotic features: Secondary | ICD-10-CM | POA: Diagnosis not present

## 2023-10-21 DIAGNOSIS — D638 Anemia in other chronic diseases classified elsewhere: Secondary | ICD-10-CM | POA: Insufficient documentation

## 2023-10-21 DIAGNOSIS — F01518 Vascular dementia, unspecified severity, with other behavioral disturbance: Secondary | ICD-10-CM

## 2023-10-21 DIAGNOSIS — E785 Hyperlipidemia, unspecified: Secondary | ICD-10-CM

## 2023-10-21 DIAGNOSIS — J432 Centrilobular emphysema: Secondary | ICD-10-CM | POA: Diagnosis not present

## 2023-10-21 DIAGNOSIS — N400 Enlarged prostate without lower urinary tract symptoms: Secondary | ICD-10-CM | POA: Diagnosis not present

## 2023-10-21 DIAGNOSIS — J3089 Other allergic rhinitis: Secondary | ICD-10-CM

## 2023-10-21 DIAGNOSIS — K219 Gastro-esophageal reflux disease without esophagitis: Secondary | ICD-10-CM

## 2023-10-21 MED ORDER — PRAVASTATIN SODIUM 40 MG PO TABS
40.0000 mg | ORAL_TABLET | Freq: Every day | ORAL | 1 refills | Status: DC
Start: 2023-10-21 — End: 2024-02-04

## 2023-10-21 MED ORDER — CITALOPRAM HYDROBROMIDE 10 MG PO TABS: 10.0000 mg | ORAL_TABLET | Freq: Every day | ORAL | 1 refills | Status: DC

## 2023-10-21 MED ORDER — LEVOCETIRIZINE DIHYDROCHLORIDE 5 MG PO TABS
5.0000 mg | ORAL_TABLET | Freq: Every evening | ORAL | 1 refills | Status: DC
Start: 2023-10-21 — End: 2024-02-04

## 2023-10-21 MED ORDER — QUETIAPINE FUMARATE 25 MG PO TABS: 25.0000 mg | ORAL_TABLET | Freq: Every day | ORAL | 1 refills | Status: DC

## 2023-10-21 NOTE — Progress Notes (Signed)
 Name: Eugene Murphy   MRN: [id number]    DOB: 10-02-1932   Date:10/21/2023       Progress Note  Subjective  Chief Complaint  Chief Complaint  Patient presents with   Dementia    HPI  Dementia  with behavior changes/vascular dementia/depression with psychotic features:  his health went down around Summer 2019  he started going to multiple doctors. He is stable now, no recent hallucinations. He gets frustrated at times. Taking medications now that the family . He is going to bed super early, around 5 pm but wakes up in the middle of the night. Advised to try going to bed later   BPH: he sees Dr. Twylla, he states nocturia has improved, about twice per night He is out of cardura  and since bp is normal advised to stay off, continue finasteride  Hyperlipidemia: niece did not bring pravastatin , does not seem to be taking but willing to resume it, I will send rx today    GERD: he has been off Prevacid , taking pepcid  prn and doing well, weight is trending upt   Anemia of chronic diease: no longer seeing Dr. Rudell, last levels stable, iron storage normal.     OSA: he had lost 75  lbs in a period of 2 years, but gradually gaining weight back. He is under the care of  Dr. Theotis that discontinued his CPAP machine. Advised follow up with Dr. Theotis    Asthma mild intermittent/COPD/Emphysema : seeing Dr. 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 Only using albuterol prn,he used to also take singulair and zytec but no longer on active medication list, Advised follow up with Dr. Theotis    Patient Active Problem List   Diagnosis Date Noted   Mild protein-calorie malnutrition (HCC) 12/28/2020   Major depression with psychotic features (HCC) 12/28/2020   Dementia with behavioral disturbance (HCC) 09/12/2018   Abnormal computed tomography of large intestine    Benign neoplasm of ascending colon    Polyp of  sigmoid colon    Lesion of liver 06/17/2018   OSA on CPAP 12/31/2017   GERD (gastroesophageal reflux disease) 07/25/2016   Environmental and seasonal allergies 02/29/2016   Elevated PSA 11/02/2015   Hyperlipidemia 04/13/2015   BPH with obstruction/lower urinary tract symptoms 04/06/2015   Hypersomnia with sleep apnea 12/21/2013   COPD with asthma (HCC) 12/21/2013    Past Surgical History:  Procedure Laterality Date   COLONOSCOPY WITH PROPOFOL  N/A 06/19/2018   Procedure: COLONOSCOPY WITH PROPOFOL  WITH BIOPSIES;  Surgeon: Jinny Carmine, MD;  Location: [facility];  Service: Endoscopy;  Laterality: N/A;  sleep apnea   COSMETIC SURGERY Left    Left Arm Plastic Surgery in 1970s    Family History  Problem Relation Age of Onset   Congestive Heart Failure Father    Healthy Sister    Dementia Brother     Social History   Tobacco Use   Smoking status: Never   Smokeless tobacco: Never  Substance Use Topics   Alcohol use: Not Currently     Current Outpatient Medications:    citalopram  (CELEXA ) 10 MG tablet, Take 1 tablet (10 mg total) by mouth daily., Disp: 90 tablet, Rfl: 1   donepezil  (ARICEPT ) 5 MG tablet, Take 1 tablet (5 mg total) by mouth at bedtime., Disp: 90 tablet, Rfl: 0   dutasteride  (AVODART ) 0.5 MG capsule, Take 1 capsule (0.5 mg  total) by mouth daily., Disp: 90 capsule, Rfl: 3   famotidine  (PEPCID ) 40 MG tablet, Take 1 tablet (40 mg total) by mouth at bedtime. For heartburn, Disp: 90 tablet, Rfl: 0   lansoprazole  (PREVACID ) 30 MG capsule, Take 1 capsule (30 mg total) by mouth daily., Disp: 90 capsule, Rfl: 1   levocetirizine (XYZAL ) 5 MG tablet, Take 1 tablet (5 mg total) by mouth every evening., Disp: 90 tablet, Rfl: 1   Melatonin 10 MG TABS, Take by mouth., Disp: , Rfl:    pravastatin  (PRAVACHOL ) 40 MG tablet, Take 1 tablet (40 mg total) by mouth daily., Disp: 90 tablet, Rfl: 1   ROCKLATAN  0.02-0.005 % SOLN, Apply 1 drop to eye at bedtime., Disp: , Rfl:    Allergies  Allergen Reactions   Shellfish Allergy Swelling    Throat swells    I personally reviewed active problem list, medication list with the patient/caregiver today.   ROS  Ten systems reviewed and is negative except as mentioned in HPI plus gaining weight , appetite has been good   Objective  Vitals:   10/21/23 1018  BP: 134/72  Pulse: 98  Resp: 18  SpO2: 99%  Weight: 198 lb 1.6 oz (89.9 kg)  Height: 5' 8 (1.727 m)    Body mass index is 30.12 kg/m.  Physical Exam  Constitutional: Patient appears well-developed and well-nourished. Obese No distress.  HEENT: head atraumatic, normocephalic, pupils equal and reactive to light, neck supple, throat within normal limits Cardiovascular: Normal rate, regular rhythm and normal heart sounds.  No murmur heard. No BLE edema. Pulmonary/Chest: Effort normal and breath sounds normal. No respiratory distress. Abdominal: Soft.  There is no tenderness. Psychiatric: Patient has a normal mood and affect. Cooperative today   Diabetic Foot Exam:     PHQ2/9:    10/21/2023   10:17 AM 05/30/2023   10:59 AM 05/15/2023    3:43 PM 11/30/2022    9:58 AM 07/05/2022    9:20 AM  Depression screen PHQ 2/9  Decreased Interest 0 0 0 0 0  Down, Depressed, Hopeless 0 0 0 0 0  PHQ - 2 Score 0 0 0 0 0  Altered sleeping 0  0  0  Tired, decreased energy 0  0  0  Change in appetite 0  0  0  Feeling bad or failure about yourself  0  0  0  Trouble concentrating 0  0  0  Moving slowly or fidgety/restless 0  0  0  Suicidal thoughts 0  0  0  PHQ-9 Score 0  0  0  Difficult doing work/chores Not difficult at all        phq 9 is negative   Fall Risk:    10/21/2023   10:06 AM 07/25/2023   11:45 AM 05/30/2023   10:59 AM 05/15/2023    3:43 PM 11/30/2022    9:56 AM  Fall Risk   Falls in the past year? 0 0 0 0 0  Number falls in past yr: 0    0  Injury with Fall? 0    0  Risk for fall due to : No Fall Risks Impaired balance/gait Impaired  balance/gait Impaired balance/gait No Fall Risks  Follow up Falls prevention discussed;Education provided;Falls evaluation completed Falls prevention discussed Falls prevention discussed Falls prevention discussed;Education provided;Falls evaluation completed Education provided;Falls prevention discussed     Assessment & Plan  1. Vascular dementia with behavior disturbance (HCC) (Primary)  On Aricept  given by Dr. Maree  2. Centrilobular emphysema (HCC)  Needs to go back to see Dr. Theotis   3. Major depression with psychotic features (HCC)  - citalopram  (CELEXA ) 10 MG tablet; Take 1 tablet (10 mg total) by mouth daily.  Dispense: 90 tablet; Refill: 1  4. BPH without obstruction/lower urinary tract symptoms  Needs to follow up with Dr. Twylla   5. Gastroesophageal reflux disease without esophagitis  Doing well on otc medicatoin  6. Anemia of chronic disease  stable  7. Dyslipidemia  - pravastatin  (PRAVACHOL ) 40 MG tablet; Take 1 tablet (40 mg total) by mouth daily. For cholesterol  Dispense: 90 tablet; Refill: 1  8. Environmental and seasonal allergies  - levocetirizine (XYZAL ) 5 MG tablet; Take 1 tablet (5 mg total) by mouth every evening.  Dispense: 90 tablet; Refill: 1

## 2023-10-21 NOTE — Patient Instructions (Addendum)
 Team Member Role and Visual Merchandiser Info Address Start End Comments  Maree Jannett POUR, MD Consulting Physician (Neurology) Phone: [phone] Fax: [phone] [address] [facility] [address] [facility] [place] [address] 02/16/2020 - -   Team Member Role and Specialty Contact Info Address Start End Comments  Twylla Glendia BROCKS, MD Consulting Physician (Urology) Phone: [phone] Fax: [phone] [address] [place] [address] 12/17/2016 - -   Team Member Role and Specialty Contact Info Address Start End Comments  Theotis Lavelle BRAVO, MD Referring Physician (Specialist) Phone: [phone] Fax: [phone] [address] [place] [address] 12/17/2016 - -

## 2023-10-30 ENCOUNTER — Ambulatory Visit: Payer: Medicare HMO | Admitting: Family Medicine

## 2023-11-08 ENCOUNTER — Telehealth: Payer: Self-pay | Admitting: Family Medicine

## 2023-11-08 NOTE — Telephone Encounter (Signed)
Pt called and stated that this medication is no longer covered by insurance QUEtiapine (SEROQUEL) 25 MG tablet. Could you please advise another one to take in its place? Please advise what medication it will be.  [facility] - [place], [place] - [facility] AT Portal to Reg[facility] [address] [place] [address] Phone: [phone] Fax: [phone]

## 2023-11-08 NOTE — Telephone Encounter (Signed)
Tried to call patient, someone answered no response. Redialed again no answer unable to leave vm.  Pt will need to contact his insurance and ask what alternatives will they cover and let us know.

## 2023-12-20 ENCOUNTER — Ambulatory Visit: Payer: Medicare HMO | Admitting: Urology

## 2023-12-20 ENCOUNTER — Encounter: Payer: Self-pay | Admitting: Urology

## 2023-12-20 VITALS — BP 145/79 | HR 90 | Ht 68.0 in | Wt 195.0 lb

## 2023-12-20 DIAGNOSIS — N401 Enlarged prostate with lower urinary tract symptoms: Secondary | ICD-10-CM | POA: Diagnosis not present

## 2023-12-20 DIAGNOSIS — N138 Other obstructive and reflux uropathy: Secondary | ICD-10-CM | POA: Diagnosis not present

## 2023-12-20 LAB — BLADDER SCAN AMB NON-IMAGING: Scan Result: 83

## 2023-12-20 MED ORDER — DUTASTERIDE 0.5 MG PO CAPS: 0.5000 mg | ORAL_CAPSULE | Freq: Every day | ORAL | 3 refills | Status: DC

## 2023-12-20 NOTE — Progress Notes (Signed)
 I, Maysun Anabel Bene, acting as a scribe for Riki Altes, MD., have documented all relevant documentation on the behalf of Riki Altes, MD, as directed by Riki Altes, MD while in the presence of Riki Altes, MD.  12/20/2023 1:55 PM   Lorenso Courier [birth year] [id number]  Referring provider: Alba Cory, MD [address] [place],  [place] [address]  Chief Complaint  Patient presents with   Benign Prostatic Hypertrophy   Urologic history 1. BPH with LUTS  HPI: Eugene Murphy is a 89 y.o. male presents for follow-up visit.   No significant changes since last years visit He recently ran out of dutasteride and needs a refill. Doxazosin was not listed on his med list, and they think this may have been discontinued by his PCP.  No bothersome LUTS Denies dysuria, gross hematuria Denies flank, abdominal or pelvic pain   PMH: Past Medical History:  Diagnosis Date   Arthritis    lower back   Asthma    BPH (benign prostatic hyperplasia)    Followed by Doreene Eland.   GERD (gastroesophageal reflux disease)    Glaucoma    Hyperlipidemia    Hypertension    Sleep apnea    CPAP    Surgical History: Past Surgical History:  Procedure Laterality Date   COLONOSCOPY WITH PROPOFOL N/A 06/19/2018   Procedure: COLONOSCOPY WITH PROPOFOL WITH BIOPSIES;  Surgeon: Midge Minium, MD;  Location: [facility];  Service: Endoscopy;  Laterality: N/A;  sleep apnea   COSMETIC SURGERY Left    Left Arm Plastic Surgery in 1970s    Home Medications:  Allergies as of 12/20/2023       Reactions   Shellfish Allergy Swelling   Throat swells        Medication List        Accurate as of December 20, 2023  1:55 PM. If you have any questions, ask your nurse or doctor.          citalopram 10 MG tablet Commonly known as: CELEXA Take 1 tablet (10 mg total) by mouth daily.   donepezil 5 MG tablet Commonly known as: ARICEPT Take 1 tablet (5 mg total) by mouth  at bedtime.   dutasteride 0.5 MG capsule Commonly known as: Avodart Take 1 capsule (0.5 mg total) by mouth daily.   famotidine 20 MG tablet Commonly known as: PEPCID Take 20 mg by mouth 2 (two) times daily as needed for heartburn or indigestion.   levocetirizine 5 MG tablet Commonly known as: XYZAL Take 1 tablet (5 mg total) by mouth every evening.   Melatonin 10 MG Tabs Take by mouth.   pravastatin 40 MG tablet Commonly known as: PRAVACHOL Take 1 tablet (40 mg total) by mouth daily. For cholesterol   QUEtiapine 25 MG tablet Commonly known as: SEROquel Take 1 tablet (25 mg total) by mouth at bedtime.   Rocklatan 0.02-0.005 % Soln Generic drug: Netarsudil-Latanoprost Apply 1 drop to eye at bedtime.        Allergies:  Allergies  Allergen Reactions   Shellfish Allergy Swelling    Throat swells    Family History: Family History  Problem Relation Age of Onset   Congestive Heart Failure Father    Healthy Sister    Dementia Brother     Social History:  reports that he has never smoked. He has never used smokeless tobacco. He reports that he does not currently use alcohol. He reports that he does not  use drugs.   Physical Exam: BP (!) 145/79   Pulse 90   Ht 5\' 8"  (1.727 m)   Wt 195 lb (88.5 kg)   BMI 29.65 kg/m   Constitutional:  Alert and oriented, No acute distress. HEENT: [place] AT Respiratory: Normal respiratory effort, no increased work of breathing. Psychiatric: Normal mood and affect.  Assessment & Plan:    1. BPH with obstruction/lower urinary tract symptoms Stable LUTS Bladder scan today with PVR of 83 mL Continue annual follow-up.  I have reviewed the above documentation for accuracy and completeness, and I agree with the above.   Riki Altes, MD  [facility] [address], [address] [place], [place] [address] [phone]

## 2023-12-26 ENCOUNTER — Telehealth: Payer: Self-pay | Admitting: Urology

## 2023-12-26 MED ORDER — DUTASTERIDE 0.5 MG PO CAPS: 0.5000 mg | ORAL_CAPSULE | Freq: Every day | ORAL | 3 refills | Status: DC

## 2023-12-26 NOTE — Telephone Encounter (Signed)
 Sent today to [facility]

## 2023-12-26 NOTE — Addendum Note (Signed)
 Addended by: Levada Schilling on: 12/26/2023 11:20 AM   Modules accepted: Orders

## 2023-12-26 NOTE — Telephone Encounter (Signed)
 Pt's wife called to let us know [facility]'t [facility] Avodart [facility] anymore and wants [facility] [place].

## 2023-12-30 ENCOUNTER — Telehealth: Payer: Self-pay | Admitting: Urology

## 2023-12-30 NOTE — Telephone Encounter (Signed)
 Patient's wife and niece Marcelino Duster) stopped in and requested that patient's pharmacy [facility].

## 2024-01-29 ENCOUNTER — Other Ambulatory Visit: Payer: Self-pay | Admitting: Urology

## 2024-02-04 ENCOUNTER — Other Ambulatory Visit: Payer: Self-pay | Admitting: *Deleted

## 2024-02-04 ENCOUNTER — Telehealth: Payer: Self-pay | Admitting: Urology

## 2024-02-04 ENCOUNTER — Ambulatory Visit (INDEPENDENT_AMBULATORY_CARE_PROVIDER_SITE_OTHER): Payer: Self-pay | Admitting: Family Medicine

## 2024-02-04 ENCOUNTER — Encounter: Payer: Self-pay | Admitting: Family Medicine

## 2024-02-04 VITALS — BP 122/74 | HR 86 | Resp 16 | Ht 68.0 in | Wt 199.0 lb

## 2024-02-04 DIAGNOSIS — N138 Other obstructive and reflux uropathy: Secondary | ICD-10-CM

## 2024-02-04 DIAGNOSIS — F01518 Vascular dementia, unspecified severity, with other behavioral disturbance: Secondary | ICD-10-CM

## 2024-02-04 DIAGNOSIS — K219 Gastro-esophageal reflux disease without esophagitis: Secondary | ICD-10-CM

## 2024-02-04 DIAGNOSIS — F323 Major depressive disorder, single episode, severe with psychotic features: Secondary | ICD-10-CM | POA: Diagnosis not present

## 2024-02-04 DIAGNOSIS — D638 Anemia in other chronic diseases classified elsewhere: Secondary | ICD-10-CM

## 2024-02-04 DIAGNOSIS — J3089 Other allergic rhinitis: Secondary | ICD-10-CM

## 2024-02-04 DIAGNOSIS — E785 Hyperlipidemia, unspecified: Secondary | ICD-10-CM

## 2024-02-04 DIAGNOSIS — N400 Enlarged prostate without lower urinary tract symptoms: Secondary | ICD-10-CM

## 2024-02-04 DIAGNOSIS — J432 Centrilobular emphysema: Secondary | ICD-10-CM | POA: Diagnosis not present

## 2024-02-04 MED ORDER — DONEPEZIL HCL 10 MG PO TABS: 10.0000 mg | ORAL_TABLET | Freq: Every day | ORAL | 1 refills | Status: DC

## 2024-02-04 MED ORDER — DUTASTERIDE 0.5 MG PO CAPS: 0.5000 mg | ORAL_CAPSULE | Freq: Every day | ORAL | 3 refills | Status: AC

## 2024-02-04 MED ORDER — QUETIAPINE FUMARATE 25 MG PO TABS: 25.0000 mg | ORAL_TABLET | Freq: Every day | ORAL | 1 refills | Status: DC

## 2024-02-04 MED ORDER — CITALOPRAM HYDROBROMIDE 10 MG PO TABS: 10.0000 mg | ORAL_TABLET | Freq: Every day | ORAL | 1 refills | Status: DC

## 2024-02-04 MED ORDER — PRAVASTATIN SODIUM 40 MG PO TABS: 40.0000 mg | ORAL_TABLET | Freq: Every day | ORAL | 1 refills | Status: DC

## 2024-02-04 MED ORDER — LEVOCETIRIZINE DIHYDROCHLORIDE 5 MG PO TABS: 5.0000 mg | ORAL_TABLET | Freq: Every evening | ORAL | 1 refills | Status: DC

## 2024-02-04 NOTE — Progress Notes (Signed)
 Name: Eugene Murphy   MRN: [id number]    DOB: 1931/11/03   Date:02/04/2024       Progress Note  Subjective  Chief Complaint  Chief Complaint  Patient presents with   Medical Management of Chronic Issues   HPI   Dementia  with behavior changes/vascular dementia/depression with psychotic features:  his health went down around Summer 2019  he started going to multiple doctors. He is stable now, no recent hallucinations. He seems to be having sundowning in the evenings, taking seroquel  before bed, discussed considering taking with supper instead. He has been compliant with medication since family members are placing medication on pill box every Monday.  We will adjust Aricept  dose to 10 mg , continue Citalopram  for mood   BPH: he sees Dr. Cherylene Corrente, he states nocturia has improved, taking medication as prescribed   Hyperlipidemia: taking pravastatin  daily and denies myopathy    GERD: he has been off Prevacid , taking pepcid  prn and doing well   Anemia of chronic diease: no longer seeing Dr. Beverely Buba, last levels stable, iron storage normal.   We will recheck yearly    OSA: he had lost 75  lbs in a period of 2 years, but gradually gaining weight back. He is under the care of  Dr. Jamal Mays that discontinued his CPAP machine.    Asthma mild intermittent/Emphysema : seeing Dr. 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 He does not seem to be taking albuterol lately, taking Xyzal  for environmental allergies.    Patient Active Problem List   Diagnosis Date Noted   Centrilobular emphysema (HCC) 10/21/2023   Anemia of chronic disease 10/21/2023   Mild protein-calorie malnutrition (HCC) 12/28/2020   Major depression with psychotic features (HCC) 12/28/2020   Vascular dementia with behavior disturbance (HCC) 09/12/2018   Abnormal computed tomography of large intestine    Benign neoplasm of ascending colon     Polyp of sigmoid colon    Lesion of liver 06/17/2018   OSA on CPAP 12/31/2017   GERD (gastroesophageal reflux disease) 07/25/2016   Environmental and seasonal allergies 02/29/2016   Elevated PSA 11/02/2015   Dyslipidemia 04/13/2015   BPH without obstruction/lower urinary tract symptoms 04/06/2015   Hypersomnia with sleep apnea 12/21/2013   COPD with asthma (HCC) 12/21/2013    Past Surgical History:  Procedure Laterality Date   COLONOSCOPY WITH PROPOFOL  N/A 06/19/2018   Procedure: COLONOSCOPY WITH PROPOFOL  WITH BIOPSIES;  Surgeon: Marnee Sink, MD;  Location: [facility];  Service: Endoscopy;  Laterality: N/A;  sleep apnea   COSMETIC SURGERY Left    Left Arm Plastic Surgery in 1970s    Family History  Problem Relation Age of Onset   Congestive Heart Failure Father    Healthy Sister    Dementia Brother     Social History   Tobacco Use   Smoking status: Never   Smokeless tobacco: Never  Substance Use Topics   Alcohol use: Not Currently     Current Outpatient Medications:    dutasteride  (AVODART ) 0.5 MG capsule, Take 1 capsule (0.5 mg total) by mouth daily., Disp: 90 capsule, Rfl: 3   famotidine  (PEPCID ) 20 MG tablet, Take 20 mg by mouth 2 (two) times daily as needed for heartburn or indigestion., Disp: , Rfl:    levocetirizine (XYZAL ) 5 MG tablet, Take 1 tablet (5 mg total) by mouth every evening., Disp: 90 tablet, Rfl: 1  Melatonin 10 MG TABS, Take by mouth., Disp: , Rfl:    ROCKLATAN 0.02-0.005 % SOLN, Apply 1 drop to eye at bedtime., Disp: , Rfl:    citalopram  (CELEXA ) 10 MG tablet, Take 1 tablet (10 mg total) by mouth daily., Disp: 90 tablet, Rfl: 1   donepezil  (ARICEPT ) 10 MG tablet, Take 1 tablet (10 mg total) by mouth at bedtime., Disp: 90 tablet, Rfl: 1   pravastatin  (PRAVACHOL ) 40 MG tablet, Take 1 tablet (40 mg total) by mouth daily. For cholesterol, Disp: 90 tablet, Rfl: 1   QUEtiapine  (SEROQUEL ) 25 MG tablet, Take 1 tablet (25 mg total) by mouth at  bedtime., Disp: 90 tablet, Rfl: 1  Allergies  Allergen Reactions   Shellfish Allergy Swelling    Throat swells    I personally reviewed active problem list, medication list, allergies with the patient/caregiver today.   ROS  Ten systems reviewed and is negative except as mentioned in HPI    Objective Physical Exam Constitutional: Patient appears well-developed and well-nourished. Obese  No distress.  HEENT: head atraumatic, normocephalic, pupils equal and reactive to light, neck supple Cardiovascular: Normal rate, regular rhythm and normal heart sounds.  No murmur heard. No BLE edema. Pulmonary/Chest: Effort normal and breath sounds normal. No respiratory distress. Muscular Skeletal: slow gait, uses a cane Psychiatric: Patient has a normal mood and affect. He is cooperative today   Vitals:   02/04/24 1032  BP: 122/74  Pulse: 86  Resp: 16  SpO2: 99%  Weight: 199 lb (90.3 kg)  Height: 5\' 8"  (1.727 m)    Body mass index is 30.26 kg/m.  Recent Results (from the past 2160 hours)  Bladder Scan (Post Void Residual) in office     Status: None   Collection Time: 12/20/23 11:15 AM  Result Value Ref Range   Scan Result 83     PHQ2/9:    02/04/2024   10:30 AM 10/21/2023   10:17 AM 05/30/2023   10:59 AM 05/15/2023    3:43 PM 11/30/2022    9:58 AM  Depression screen PHQ 2/9  Decreased Interest 0 0 0 0 0  Down, Depressed, Hopeless 0 0 0 0 0  PHQ - 2 Score 0 0 0 0 0  Altered sleeping 0 0  0   Tired, decreased energy 0 0  0   Change in appetite 0 0  0   Feeling bad or failure about yourself  0 0  0   Trouble concentrating 0 0  0   Moving slowly or fidgety/restless 0 0  0   Suicidal thoughts 0 0  0   PHQ-9 Score 0 0  0   Difficult doing work/chores Not difficult at all Not difficult at all       phq 9 is negative  Fall Risk:    10/21/2023   10:06 AM 07/25/2023   11:45 AM 05/30/2023   10:59 AM 05/15/2023    3:43 PM 11/30/2022    9:56 AM  Fall Risk   Falls in the past  year? 0 0 0 0 0  Number falls in past yr: 0    0  Injury with Fall? 0    0  Risk for fall due to : No Fall Risks Impaired balance/gait Impaired balance/gait Impaired balance/gait No Fall Risks  Follow up Falls prevention discussed;Education provided;Falls evaluation completed Falls prevention discussed Falls prevention discussed Falls prevention discussed;Education provided;Falls evaluation completed Education provided;Falls prevention discussed     Assessment & Plan  1.  Centrilobular emphysema (HCC) (Primary)  Denies symptoms at this time  2. Vascular dementia with behavior disturbance (HCC)  - donepezil  (ARICEPT ) 10 MG tablet; Take 1 tablet (10 mg total) by mouth at bedtime.  Dispense: 90 tablet; Refill: 1 - QUEtiapine  (SEROQUEL ) 25 MG tablet; Take 1 tablet (25 mg total) by mouth at bedtime.  Dispense: 90 tablet; Refill: 1  3. Major depression with psychotic features (HCC)  - QUEtiapine  (SEROQUEL ) 25 MG tablet; Take 1 tablet (25 mg total) by mouth at bedtime.  Dispense: 90 tablet; Refill: 1 - citalopram  (CELEXA ) 10 MG tablet; Take 1 tablet (10 mg total) by mouth daily.  Dispense: 90 tablet; Refill: 1  4. Gastroesophageal reflux disease without esophagitis  Controlled with pepcid  otc   5. Anemia of chronic disease  Recheck yearly   6. BPH without obstruction/lower urinary tract symptoms  Under the care of urologist   7. Dyslipidemia  - pravastatin  (PRAVACHOL ) 40 MG tablet; Take 1 tablet (40 mg total) by mouth daily. For cholesterol  Dispense: 90 tablet; Refill: 1  8. Environmental and seasonal allergies  - levocetirizine (XYZAL ) 5 MG tablet; Take 1 tablet (5 mg total) by mouth every evening.  Dispense: 90 tablet; Refill: 1

## 2024-02-04 NOTE — Telephone Encounter (Signed)
 Patient's niece Ria Chad) dropped in at front desk today. She is on patient's DPR. She wanted to know if Doxazosin  8 mg is still a medication that he needs to be taking. It was last filled on 10/26/23. Per his chart, it says that he isn't taking it, but he has been, and she wants to know if he needs to continue. Also, [facility] is [facility] using [facility]. The last rx for Dutasteride  0.5 mg [facility] [place], but they never picked it up. She is requesting that it be resent for home delivery. Also, send Doxazosin  if he needs to continue taking. She is requesting a call back to confirm medication. [phone]

## 2024-02-04 NOTE — Telephone Encounter (Signed)
 Please verify how long that he has been off the doxazosin .  If he has been off for 1-2 weeks and not seeing any worsening in his urinary symptoms would discontinue.

## 2024-02-05 NOTE — Telephone Encounter (Signed)
 Patient has been off the medication for about a week in a half. He  is not going to take doxazosin  . Its no help.

## 2024-03-08 ENCOUNTER — Emergency Department
Admission: EM | Admit: 2024-03-08 | Discharge: 2024-03-14 | Disposition: A | Discharge status: Home / Self Care | Attending: Emergency Medicine | Admitting: Emergency Medicine

## 2024-03-08 DIAGNOSIS — F01518 Vascular dementia, unspecified severity, with other behavioral disturbance: Secondary | ICD-10-CM | POA: Diagnosis not present

## 2024-03-08 DIAGNOSIS — R4182 Altered mental status, unspecified: Secondary | ICD-10-CM | POA: Diagnosis present

## 2024-03-08 DIAGNOSIS — F03911 Unspecified dementia, unspecified severity, with agitation: Secondary | ICD-10-CM | POA: Diagnosis not present

## 2024-03-08 DIAGNOSIS — J449 Chronic obstructive pulmonary disease, unspecified: Secondary | ICD-10-CM | POA: Diagnosis not present

## 2024-03-08 DIAGNOSIS — F29 Unspecified psychosis not due to a substance or known physiological condition: Secondary | ICD-10-CM | POA: Insufficient documentation

## 2024-03-08 LAB — COMPREHENSIVE METABOLIC PANEL WITH GFR
ALT: 32 U/L (ref 0–44)
AST: 37 U/L (ref 15–41)
Albumin: 4.2 g/dL (ref 3.5–5.0)
Alkaline Phosphatase: 42 U/L (ref 38–126)
Anion gap: 9 (ref 5–15)
BUN: 24 mg/dL — ABNORMAL HIGH (ref 8–23)
CO2: 25 mmol/L (ref 22–32)
Calcium: 9.4 mg/dL (ref 8.9–10.3)
Chloride: 102 mmol/L (ref 98–111)
Creatinine, Ser: 1.3 mg/dL — ABNORMAL HIGH (ref 0.61–1.24)
GFR, Estimated: 53 mL/min — ABNORMAL LOW (ref >60)
Glucose, Bld: 96 mg/dL (ref 70–99)
Potassium: 4.1 mmol/L (ref 3.5–5.1)
Sodium: 136 mmol/L (ref 135–145)
Total Bilirubin: 1.1 mg/dL (ref 0.0–1.2)
Total Protein: 7 g/dL (ref 6.5–8.1)

## 2024-03-08 LAB — URINALYSIS, ROUTINE W REFLEX MICROSCOPIC
Bilirubin Urine: NEGATIVE
Glucose, UA: NEGATIVE mg/dL
Hgb urine dipstick: NEGATIVE
Ketones, ur: 5 mg/dL — AB
Leukocytes,Ua: NEGATIVE
Nitrite: NEGATIVE
Protein, ur: 100 mg/dL — AB
Specific Gravity, Urine: 1.015 (ref 1.005–1.030)
pH: 6 (ref 5.0–8.0)

## 2024-03-08 LAB — CBC WITH DIFFERENTIAL/PLATELET
Abs Immature Granulocytes: 0.03 10*3/uL (ref 0.00–0.07)
Basophils Absolute: 0 10*3/uL (ref 0.0–0.1)
Basophils Relative: 0 %
Eosinophils Absolute: 0 10*3/uL (ref 0.0–0.5)
Eosinophils Relative: 0 %
HCT: 41.9 % (ref 39.0–52.0)
Hemoglobin: 14.1 g/dL (ref 13.0–17.0)
Immature Granulocytes: 0 %
Lymphocytes Relative: 14 %
Lymphs Abs: 1 10*3/uL (ref 0.7–4.0)
MCH: 31 pg (ref 26.0–34.0)
MCHC: 33.7 g/dL (ref 30.0–36.0)
MCV: 92.1 fL (ref 80.0–100.0)
Monocytes Absolute: 0.9 10*3/uL (ref 0.1–1.0)
Monocytes Relative: 13 %
Neutro Abs: 5.4 10*3/uL (ref 1.7–7.7)
Neutrophils Relative %: 73 %
Platelets: 223 10*3/uL (ref 150–400)
RBC: 4.55 MIL/uL (ref 4.22–5.81)
RDW: 12.7 % (ref 11.5–15.5)
WBC: 7.4 10*3/uL (ref 4.0–10.5)
nRBC: 0 % (ref 0.0–0.2)

## 2024-03-08 LAB — ETHANOL: Alcohol, Ethyl (B): <15 mg/dL (ref <15)

## 2024-03-08 LAB — URINE DRUG SCREEN, QUALITATIVE (ARMC ONLY)
Amphetamines, Ur Screen: NOT DETECTED
Barbiturates, Ur Screen: NOT DETECTED
Benzodiazepine, Ur Scrn: POSITIVE — AB
Cannabinoid 50 Ng, Ur ~~LOC~~: NOT DETECTED
Cocaine Metabolite,Ur ~~LOC~~: NOT DETECTED
MDMA (Ecstasy)Ur Screen: NOT DETECTED
Methadone Scn, Ur: NOT DETECTED
Opiate, Ur Screen: NOT DETECTED
Phencyclidine (PCP) Ur S: NOT DETECTED
Tricyclic, Ur Screen: NOT DETECTED

## 2024-03-08 MED ORDER — MIDAZOLAM HCL 2 MG/2ML IJ SOLN
2.0000 mg | Freq: Once | INTRAMUSCULAR | Status: AC
  Administered 2024-03-08: 2 mg via INTRAMUSCULAR
  Filled 2024-03-08: qty 2

## 2024-03-08 MED ORDER — DONEPEZIL HCL 5 MG PO TABS
5.0000 mg | ORAL_TABLET | Freq: Every day | ORAL | Status: DC
  Administered 2024-03-08 – 2024-03-13 (×6): 5 mg via ORAL
  Filled 2024-03-08 (×6): qty 1

## 2024-03-08 MED ORDER — QUETIAPINE FUMARATE 25 MG PO TABS
25.0000 mg | ORAL_TABLET | Freq: Every day | ORAL | Status: DC
  Administered 2024-03-08 – 2024-03-13 (×6): 25 mg via ORAL
  Filled 2024-03-08 (×6): qty 1

## 2024-03-08 NOTE — ED Provider Notes (Signed)
 [facility] Provider Note    Event Date/Time   First MD Initiated Contact with Patient 03/08/24 1042     (approximate)   History   Agitation and confusion  HPI  Eugene Murphy is a 89 y.o. male history of dementia, emphysema, reflux, COPD   EM caveat: Patient with altered mental status  Patient's daughter is with him and she reports that she is concerned that he has lost his wherewithal.  He has been here and his wife has been hospitalized for 3 days, and he has been staying with her.  He has not had his home medications for 3 days and she noticed that he has become very agitated confused.  He has a history of dementia with confusion, but it seems like it got a lot worse since he has been staying with his wife in the [facility].  Evidently he was quite agitated earlier and had to have security presents on the [facility] floor  Review of records, patient has a documented history of behavioral changes and vascular dementia with psychotic features.  Patient has no specific complaint other than he feels like he is being "kidnapped" and that people are trying to swap and pretend to be "doctors"  Physical Exam    Patient initially agitated, refusing to allow vital signs.  He is aggressive  Triage Vital Signs: ED Triage Vitals  Encounter Vitals Group     BP      Systolic BP Percentile      Diastolic BP Percentile      Pulse      Resp      Temp      Temp src      SpO2      Weight      Height      Head Circumference      Peak Flow      Pain Score      Pain Loc      Pain Education      Exclude from Growth Chart     Most recent vital signs: Vitals:   03/08/24 1400 03/08/24 1439  BP: 120/76   Pulse: 73   Resp: 15   Temp:  98.4 F (36.9 C)  SpO2: 100%      General: Awake, no distress.  He however appears anxious, gets agitated when approached, and feels like people are trying to kidnap him.  He reports that he came to check on his wife today.   He is not oriented to the Murphy he is at or situation.  He does recognize his daughter CV:  Good peripheral perfusion.  Normal heart sounds Resp:  Normal effort.  Clear in no distress Abd:  No distention.  Other:  Moves all extremities well.  Stands with assistance and pivots from wheelchair to bed   ED Results / Procedures / Treatments   Labs (all labs ordered are listed, but only abnormal results are displayed) Labs Reviewed  COMPREHENSIVE METABOLIC PANEL WITH GFR - Abnormal; Notable for the following components:      Result Value   BUN 24 (*)    Creatinine, Ser 1.30 (*)    GFR, Estimated 53 (*)    All other components within normal limits  URINE DRUG SCREEN, QUALITATIVE ([facility] ONLY) - Abnormal; Notable for the following components:   Benzodiazepine, Ur Scrn POSITIVE (*)    All other components within normal limits  URINALYSIS, ROUTINE W REFLEX MICROSCOPIC - Abnormal; Notable for the following components:  Color, Urine YELLOW (*)    APPearance HAZY (*)    Ketones, ur 5 (*)    Protein, ur 100 (*)    Bacteria, UA RARE (*)    All other components within normal limits  ETHANOL  CBC WITH DIFFERENTIAL/PLATELET  CBG MONITORING, ED     EKG  EKG inter by me at 1350 Heart rate 80 QRS 85 QTc 460 Normal sinus rhythm, slight artifact.  No evidence of acute ischemia   RADIOLOGY     PROCEDURES:  Critical Care performed: No  Procedures   MEDICATIONS ORDERED IN ED: Medications  QUEtiapine  (SEROQUEL ) tablet 25 mg (has no administration in time range)  donepezil  (ARICEPT ) tablet 5 mg (has no administration in time range)  midazolam (VERSED) injection 2 mg (2 mg Intramuscular Given 03/08/24 1059)  midazolam (VERSED) injection 2 mg (2 mg Intramuscular Given 03/08/24 1153)     IMPRESSION / MDM / ASSESSMENT AND PLAN / ED COURSE  I reviewed the triage vital signs and the nursing notes.                              Differential diagnosis includes, but is not limited to,  suspect based on the clinical history and exacerbation or worsening of his dementia with behavioral disturbance, especially in light of him staying in the [facility] without access to his typical medications for 3 days.  He is now agitated confused and feels like he has been "kidnapped" but is clearly not oriented to situation.  His daughter shows appropriate concern at the bedside.  His attempt did to talk him through this situation, calm him, but at this juncture I think you require small dose of anxiolysis to facilitate care.  Given the history it would also be prudent to obtain CBC metabolic panel urinalysis.  He has no central focal neurologic deficits that would suggest acute central cause, and has a known history of dementia with behavioral disturbance which I suspect is the likely cause at this juncture as we pend additional studies.  I have placed consult with psychiatry and notified Eugene Murphy of consult to TTS as well.  ----------------------------------------- 10:58 AM on 03/08/2024 ----------------------------------------- I have ordered intramuscular anxiolysis for columning as part of the patient's treatment plan for concerns of behavioral disturbance and agitation.     Patient's presentation is most consistent with acute complicated illness / injury requiring diagnostic workup.      Clinical Course as of 03/08/24 1529  Sun Mar 08, 2024  1254 Nursing having to give additional midazolam for calming to facilitate care. [MQ]    Clinical Course User Index [MQ] Eugene Marker, MD   Patient called message approximately 1:30 PM.  Labs reviewed no acute findings noted.  No clear evidence of urinary tract infection, no acute electrolyte disturbance.  Based on the patient's clinical history and response to treatment I suspect his symptoms are likely unmasking of symptoms associated with his dementia with behavioral disturbance.  Currently awaiting psychiatry consult.  Ongoing care assigned to  Dr. Hendrick Murphy  FINAL CLINICAL IMPRESSION(S) / ED DIAGNOSES   Final diagnoses:  Agitation due to dementia Eugene Murphy)     Rx / DC Orders   ED Discharge Orders     None        Note:  This document was prepared using Dragon voice recognition software and may include unintentional dictation errors.   Eugene Marker, MD 03/08/24 [id number]

## 2024-03-08 NOTE — ED Notes (Signed)
 Spoke with Robb Sibal, pt's niece, who is in [place] , [phone].  Unable at this time to make arrangements to take pt home.  Concerned about altered mental status. Wishes to speak with a Child psychotherapist about placement

## 2024-03-08 NOTE — Discharge Instructions (Addendum)
 We have referred him to [facility]- they help with placement, care giving, etc. for the elderly. I spoke with [name] and their # is [phone]. There's also a service through [facility] called [facility], and their # is [phone]. They help with dementia care.
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
## 2024-03-08 NOTE — BH Assessment (Signed)
 Comprehensive Clinical Assessment (CCA) Screening, Triage and Referral Note  03/08/2024 NIKESH TESCHNER [id number]  Chief Complaint:  Chief Complaint  Patient presents with   Altered Mental Status   Visit Diagnosis: Altered Mental Status  Patient brought to the ER due to changes and decline in his mental status. He hasn't had his medications for dementia in three days. During that time, he was in the hospital room with his wife, as a visitor for three days.  Patient Reported Information How did you hear about us ? Family/Friend  What Is the Reason for Your Visit/Call Today? Patient brought to the ER due to changes and decline in his mental status. He hasn't had his medications for dementia in three days. During that time he was in the hospital room with his wife, as a visitor for three days.  How Long Has This Been Causing You Problems? > than 6 months  What Do You Feel Would Help You the Most Today? Treatment for Depression or other mood problem   Have You Recently Had Any Thoughts About Hurting Yourself? No  Are You Planning to Commit Suicide/Harm Yourself At This time? No   Have you Recently Had Thoughts About Hurting Someone Marigene Shoulder? No  Are You Planning to Harm Someone at This Time? No  Explanation: No data recorded  Have You Used Any Alcohol or Drugs in the Past 24 Hours? No  How Long Ago Did You Use Drugs or Alcohol? No data recorded What Did You Use and How Much? No data recorded  Do You Currently Have a Therapist/Psychiatrist? No  Name of Therapist/Psychiatrist: No data recorded  Have You Been Recently Discharged From Any Office Practice or Programs? No  Explanation of Discharge From Practice/Program: No data recorded   CCA Screening Triage Referral Assessment Type of Contact: Face-to-Face  Telemedicine Service Delivery:   Is this Initial or Reassessment?   Date Telepsych consult ordered in [facility]:    Time Telepsych consult ordered in [facility]:    Location of  Assessment: [facility] ED  Provider Location: [facility] ED    Collateral Involvement: No data recorded  Does Patient Have a Court Appointed Legal Guardian? No data recorded Name and Contact of Legal Guardian: No data recorded If Minor and Not Living with Parent(s), Who has Custody? No data recorded Is CPS involved or ever been involved? Never  Is APS involved or ever been involved? Never   Patient Determined To Be At Risk for Harm To Self or Others Based on Review of Patient Reported Information or Presenting Complaint? No  Method: No data recorded Availability of Means: No data recorded Intent: No data recorded Notification Required: No data recorded Additional Information for Danger to Others Potential: No data recorded Additional Comments for Danger to Others Potential: No data recorded Are There Guns or Other Weapons in Your Home? No data recorded Types of Guns/Weapons: No data recorded Are These Weapons Safely Secured?                            No data recorded Who Could Verify You Are Able To Have These Secured: No data recorded Do You Have any Outstanding Charges, Pending Court Dates, Parole/Probation? No data recorded Contacted To Inform of Risk of Harm To Self or Others: No data recorded  Does Patient Present under Involuntary Commitment? No   [place] of Residence: [place]   Patient Currently Receiving the Following Services: Medication Management   Determination of Need: Emergent (2  hours)   Options For Referral: ED Visit   Disposition Recommendation per psychiatric provider: There are no psychiatric contraindications to discharge at this time  Bryce Captain MS, LCAS, [facility], [facility] Therapeutic Triage Specialist 03/08/2024 4:18 PM

## 2024-03-08 NOTE — ED Notes (Signed)
 Pt very agitated and combative when he arrived, unable to apply full fall bundle. I placed Pt on alarm, will resume rest of fall bundle once Pt is in a better place mentally.

## 2024-03-08 NOTE — ED Notes (Signed)
 This RN attempted to get lab work and EKG Pt combative and agitated

## 2024-03-08 NOTE — ED Notes (Addendum)
 Patient pulled off all monitoring devices. Patietn pulled off Patient ID band. Patient pulled off Fall risk band. Patient refused to put any monitoring devices or ID bands back on.

## 2024-03-09 NOTE — ED Notes (Signed)
 Spoke to [place] - Daughter. "She is not coming to get him, not sending anyone to get him, He's having an emergency, and He can stay there."

## 2024-03-09 NOTE — Evaluation (Signed)
 Occupational Therapy Evaluation Patient Details Name: Eugene Murphy MRN: [id number] DOB: August 06, 1932 Today's Date: 03/09/2024   History of Present Illness   Pt is an 89 yo male that presented to the ED for agitated behaviors, AMS. PMH of emphysema, dementia, GERD, COPD.     Clinical Impressions Pt transitioned to easily from PT session to OT evaluation. Pt reports living at home with wife and being Ind at baseline with use of SPC outside of the home. Pt, wife, and family assist with IADL needs. Pt asking for timeline of his wife's discharge from hospital and asking if she is okay because he is unsure where she is at this time. Pt demonstrates ambulation in room with and without SPC with close supervision. Pt able to perform toilet transfer and standing grooming tasks at supervision overall. He is close to functional baseline and would benefit from mobility team seeing him while in hospital. Pt does not have skilled acute OT need at this time. OT to complete orders and pt agrees.      If plan is discharge home, recommend the following:   Assistance with cooking/housework;Assist for transportation;Help with stairs or ramp for entrance     Functional Status Assessment   Patient has not had a recent decline in their functional status     Equipment Recommendations   None recommended by OT      Precautions/Restrictions   Precautions Precautions: Fall     Mobility Bed Mobility               General bed mobility comments: pt seated inr ecliner at start/end of session    Transfers Overall transfer level: Needs assistance Equipment used: Straight cane, None Transfers: Sit to/from Stand Sit to Stand: Supervision                  Balance Overall balance assessment: Needs assistance Sitting-balance support: Feet supported Sitting balance-Leahy Scale: Good     Standing balance support: Single extremity supported Standing balance-Leahy Scale: Good                              ADL either performed or assessed with clinical judgement   ADL                                         General ADL Comments: supervision for ambulation within room with or without cane and able to demonstrate toilet transfer with supervision. Pt is close to baseline.     Vision Patient Visual Report: No change from baseline              Pertinent Vitals/Pain Pain Assessment Pain Assessment: No/denies pain     Extremity/Trunk Assessment Upper Extremity Assessment Upper Extremity Assessment: Overall WFL for tasks assessed   Lower Extremity Assessment Lower Extremity Assessment: Generalized weakness       Communication Communication Communication: Impaired Factors Affecting Communication: Hearing impaired   Cognition Arousal: Alert Behavior During Therapy: WFL for tasks assessed/performed               OT - Cognition Comments: Pt with some mild confusion but following all commands and very pleasant during session                 Following commands: Intact       Cueing  General Comments   Cueing  Techniques: Verbal cues;Tactile cues              Home Living Family/patient expects to be discharged to:: Private residence Living Arrangements: Spouse/significant other Available Help at Discharge: Family Type of Home: House Home Access: Stairs to enter [employer] of Steps: 3-4 (R) Entrance Stairs-Rails: Right Home Layout: One level     Bathroom Shower/Tub: Walk-in shower;Tub/shower unit         Home Equipment: Cane - single point          Prior Functioning/Environment               Mobility Comments: pt reported independent in ADLs, use of SPC outside of home ADLs Comments: Pt reports being Ind at baseline for ADLs and they share IADL tasks. Use of SPC for mobility outside of home.            OT Goals(Current goals can be found in the care plan section)   Acute  Rehab OT Goals Patient Stated Goal: to go home and know that my wife is okay OT Goal Formulation: With patient Time For Goal Achievement: 03/09/24 Potential to Achieve Goals: Fair   AM-PAC OT "6 Clicks" Daily Activity     Outcome Measure Help from another person eating meals?: None Help from another person taking care of personal grooming?: None Help from another person toileting, which includes using toliet, bedpan, or urinal?: None Help from another person bathing (including washing, rinsing, drying)?: A Little Help from another person to put on and taking off regular upper body clothing?: None Help from another person to put on and taking off regular lower body clothing?: A Little 6 Click Score: 22   End of Session Equipment Utilized During Treatment: Rolling walker (2 wheels) Nurse Communication: Mobility status  Activity Tolerance: Patient tolerated treatment well Patient left: in bed;with call bell/phone within reach;with bed alarm set                   Time: 1400-1415 OT Time Calculation (min): 15 min Charges:  OT General Charges $OT Visit: 1 Visit OT Evaluation $OT Eval Low Complexity: 1 Low  George Kinder, MS, OTR/L , CBIS ascom [phone]  03/09/24, 3:55 PM
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
## 2024-03-09 NOTE — Evaluation (Signed)
 Physical Therapy Evaluation Patient Details Name: Eugene Murphy MRN: [id number] DOB: [birth year] Today's Date: 03/09/2024  History of Present Illness  Pt is an 89 yo male that presented to the ED for agitated behaviors, AMS. PMH of emphysema, dementia, GERD, COPD.  Clinical Impression  Patient alert, oriented to self and place, disoriented to situation but knows he's at the hospital and asking after his wife. Per pt at baseline he is ambulatory in home without AD, SPC for community, denies recent falls. Able to perform ADLs, and family/friends assist with IADLs as needed.    He was able to sit <>stand with SPC, CGA. He ambulated ~239ft with SPC/without SPC. Some increased unsteadiness noted without. occasional scissoring of gait/gait path deviations. pt reported mild unsteadiness compared to baseline, and agreeable to use [facility] to maximize safety. Pt appears to be nearing baseline level of functioning but would benefit from further skilled PT intervention to maximize safety and ensure return to PLOF.       If plan is discharge home, recommend the following: Assistance with cooking/housework;Assist for transportation;Help with stairs or ramp for entrance   Can travel by private vehicle        Equipment Recommendations None recommended by PT  Recommendations for Other Services       Functional Status Assessment Patient has had a recent decline in their functional status and demonstrates the ability to make significant improvements in function in a reasonable and predictable amount of time.     Precautions / Restrictions Precautions Precautions: Fall Restrictions Weight Bearing Restrictions Per Provider Order: No      Mobility  Bed Mobility               General bed mobility comments: pt seated inr ecliner at start/end of session    Transfers Overall transfer level: Needs assistance Equipment used: Straight cane Transfers: Sit to/from Stand Sit to Stand: Contact guard  assist                Ambulation/Gait Ambulation/Gait assistance: Supervision, Contact guard assist Gait Distance (Feet): 200 Feet Assistive device: None, Straight cane         General Gait Details: pt able to ambulate with and without SPC, some increased unsteadiness noted without. occasional scissoring of gait/gait path deviations. pt reported mild unsteadiness compared to baseline  Stairs            Wheelchair Mobility     Tilt Bed    Modified Rankin (Stroke Patients Only)       Balance Overall balance assessment: Needs assistance Sitting-balance support: Feet supported Sitting balance-Leahy Scale: Good     Standing balance support: Single extremity supported Standing balance-Leahy Scale: Good                               Pertinent Vitals/Pain Pain Assessment Pain Assessment: No/denies pain    Home Living Family/patient expects to be discharged to:: Private residence Living Arrangements: Spouse/significant other Available Help at Discharge: Family Type of Home: House Home Access: Stairs to enter Entrance Stairs-Rails: Right Entrance Stairs-Number of Steps: 3-4 (R)   Home Layout: One level Home Equipment: Cane - single point      Prior Function               Mobility Comments: pt reported independent in ADLs, use of SPC outside of home       Extremity/Trunk Assessment   Upper Extremity Assessment Upper Extremity  Assessment: Overall WFL for tasks assessed    Lower Extremity Assessment Lower Extremity Assessment: Generalized weakness (able to lift BLE against gravity)       Communication        Cognition Arousal: Alert Behavior During Therapy: WFL for tasks assessed/performed   PT - Cognitive impairments: No apparent impairments                                 Cueing       General Comments      Exercises     Assessment/Plan    PT Assessment Patient needs continued PT services  PT  Problem List Decreased activity tolerance;Decreased balance;Decreased mobility       PT Treatment Interventions Balance training;DME instruction;Neuromuscular re-education;Gait training;Stair training;Functional mobility training;Patient/family education;Therapeutic activities;Therapeutic exercise    PT Goals (Current goals can be found in the Care Plan section)  Acute Rehab PT Goals Patient Stated Goal: to find out about his wife Flora PT Goal Formulation: With patient Time For Goal Achievement: 03/23/24 Potential to Achieve Goals: Good    Frequency Min 2X/week     Co-evaluation               AM-PAC PT "6 Clicks" Mobility  Outcome Measure Help needed turning from your back to your side while in a flat bed without using bedrails?: None Help needed moving from lying on your back to sitting on the side of a flat bed without using bedrails?: None Help needed moving to and from a bed to a chair (including a wheelchair)?: None Help needed standing up from a chair using your arms (e.g., wheelchair or bedside chair)?: A Little Help needed to walk in hospital room?: A Little Help needed climbing 3-5 steps with a railing? : A Little 6 Click Score: 21    End of Session Equipment Utilized During Treatment: Gait belt Activity Tolerance: Patient tolerated treatment well Patient left: in chair;Other (comment) (with OT at bedside)   PT Visit Diagnosis: Other abnormalities of gait and mobility (R26.89);Difficulty in walking, not elsewhere classified (R26.2);Muscle weakness (generalized) (M62.81)    Time: 1351-1401 PT Time Calculation (min) (ACUTE ONLY): 10 min   Charges:   PT Evaluation $PT Eval Low Complexity: 1 Low   PT General Charges $$ ACUTE PT VISIT: 1 Visit         Darien Eden PT, DPT 3:37 PM,03/09/24

## 2024-03-09 NOTE — ED Provider Notes (Signed)
-----------------------------------------   5:59 AM on 03/09/2024 -----------------------------------------  Blood pressure 120/76, pulse 73, temperature 98.4 F (36.9 C), temperature source Oral, resp. rate 15, SpO2 100%.  The patient is calm and cooperative at this time.  There have been no acute events since the last update.  Awaiting disposition plan from the Social Work team.     Lind Repine, MD 03/09/24 [phone]

## 2024-03-09 NOTE — ED Notes (Signed)
 Pt informed that Social Work would be asked to come by in the morning with updates on next steps.

## 2024-03-09 NOTE — ED Notes (Signed)
 Pt reporting to ED initially d/t AMS. Per triage note, pt was upstairs visiting wife when he became verbally aggressive towards staff. Pt has hx of dementia and had not been taking his prescribed medications over the last few days. Pt is calm and cooperative at this time, requesting an update on his plan of care. Pt ABCs intact. RR even and unlabored. Pt in NAD. Bed in lowest locked position. Call bell in reach. Denies further needs at this time.    Past Medical History:  Diagnosis Date   Arthritis    lower back   Asthma    BPH (benign prostatic hyperplasia)    Followed by Rober Chimera.   GERD (gastroesophageal reflux disease)    Glaucoma    Hyperlipidemia    Hypertension    Sleep apnea    CPAP

## 2024-03-09 NOTE — ED Notes (Signed)
 Pt ABCs intact. RR even and unlabored. Pt in NAD. Bed in lowest locked position. Call bell in reach. Denies needs at this time.

## 2024-03-09 NOTE — TOC Initial Note (Addendum)
 Transition of Care [facility]) - Initial/Assessment Note    Patient Details  Name: Eugene Murphy MRN: [id number] Date of Birth: [birth year]  Transition of Care [facility]) CM/SW Contact:    [name], RN Phone Number: 03/09/2024, 9:18 AM  Clinical Narrative:                 Met with pt in his ED room. He has dementia and isn't sure why he's here, and doesn't remember getting agitated in his wife's hospital room. Tried calling wife but was no answer and looks like she was Dc'd home yesterday. Per chart review pt was here with his wife for 3 days while she was hospitalized, and most likely was not taking his medications. He states he lives at home with his wife and states, "we get along really well". Will attempt to reach family to discuss further his DC needs. 1100: Tried calling his wife but reached his niece Delmas Ferrari. She states his wife is staying with her daughter Adah Acron after being Dc'd from the hospital yesterday. Spoke to [employer]- spouse) and she voices difficulties managing pt with his dementia and behaviors towards the evening hours. She feels he should go to a SNF with the long term goal of going to a memory care unit. Have had 2 messages from 2 different family members calling about this pt. 1505: Tried calling his wife but reached his niece [name] again. Explained to her that we have to see if he qualifies to go to a SNF based on the PT/OT evals. Once there he will be allowed to stay approx. 20 days under Medicare guidelines. Also explained Medicare doesn't pay for LTC (memory care) and this RNCM will ask the financial navigator to call them to explain further. Explained they will have to self pay for a [facility] unit or have him come home with [facility], and have either family or hired caregivers to come in and [place] verbalized understanding. 1615: PT sent Epic chat that pt's at his baseline and all they can recommend is HHC. TOC to follow up tomorrow.         Patient Goals and CMS Choice             Expected Discharge Plan and Services                                              Prior Living Arrangements/Services                       Activities of Daily Living      Permission Sought/Granted                  Emotional Assessment              Admission diagnosis:  altered mental status Patient Active Problem List   Diagnosis Date Noted   Centrilobular emphysema (HCC) 10/21/2023   Anemia of chronic disease 10/21/2023   Mild protein-calorie malnutrition (HCC) 12/28/2020   Major depression with psychotic features (HCC) 12/28/2020   Vascular dementia with behavior disturbance (HCC) 09/12/2018   Abnormal computed tomography of large intestine    Benign neoplasm of ascending colon    Polyp of sigmoid colon    Lesion of liver 06/17/2018   OSA on CPAP 12/31/2017   GERD (gastroesophageal reflux disease) 07/25/2016   Environmental and  seasonal allergies 02/29/2016   Elevated PSA 11/02/2015   Dyslipidemia 04/13/2015   BPH without obstruction/lower urinary tract symptoms 04/06/2015   Hypersomnia with sleep apnea 12/21/2013   COPD with asthma (HCC) 12/21/2013   PCP:  Arleen Lacer, MD Pharmacy:   [facility] - [place], [place] - [address] [facility] AT Portal to Registered [address] [place] [address] Phone: [phone] Fax: [phone]     Social Drivers of Health (SDOH) Social History: SDOH Screenings   Food Insecurity: No Food Insecurity (11/30/2022)  Housing: Unknown (10/29/2023)   Received from [facility]  Transportation Needs: No Transportation Needs (11/30/2022)  Utilities: Not At Risk (11/30/2022)  Alcohol Screen: Low Risk  (11/30/2022)  Depression (PHQ2-9): Low Risk  (02/04/2024)  Financial Resource Strain: Low Risk  (11/30/2022)  Physical Activity: Inactive (11/30/2022)  Social Connections: Moderately Integrated (11/30/2022)  Stress: No Stress  Concern Present (11/30/2022)  Tobacco Use: Low Risk  (02/04/2024)   SDOH Interventions:     Readmission Risk Interventions     No data to display

## 2024-03-09 NOTE — ED Notes (Signed)
 Breakfast tray given.

## 2024-03-10 ENCOUNTER — Other Ambulatory Visit: Payer: Self-pay

## 2024-03-10 NOTE — ED Notes (Signed)
Pt awake and breakfast tray provided.

## 2024-03-10 NOTE — ED Notes (Signed)
 PT assisted to the bathroom and then back to bed

## 2024-03-10 NOTE — ED Provider Notes (Signed)
 Emergency Medicine Observation Re-evaluation Note  Physical Exam   BP 122/72 (BP Location: Right Arm)   Pulse 70   Temp 98.5 F (36.9 C) (Oral)   Resp 16   SpO2 98%   Patient appears in no acute distress.  ED Course / MDM   No reported events during my shift at the time of this note.   Pt is awaiting dispo from consultants   Buell Carmin MD    Buell Carmin, MD 03/10/24 [phone]

## 2024-03-10 NOTE — ED Notes (Addendum)
 This RN called [name], pt's niece to inquire when ride would be provided for discharge. Niece reporting difficulty finding ride for pt and daughter and herself is in contact with attorney to help progress the process. Charge RN made aware  [name] [phone]

## 2024-03-10 NOTE — ED Notes (Signed)
 RN at bedside due to bed alarm sounding. Pt assisted to bedside commode. Pt assisted back into bed and TV turned on per request.

## 2024-03-10 NOTE — TOC Progression Note (Addendum)
 Transition of Care [facility]) - Progression Note    Patient Details  Name: Eugene Murphy MRN: [id number] Date of Birth: 01-27-1932  Transition of Care [facility]) CM/SW Contact  [place] L Maha Fischel, [place] Phone Number: 03/10/2024, 10:54 AM  Clinical Narrative:     CSW met with pt to follow-up. Pt appears to be communicating clearly. He stated that last night a gentleman came and gave him some pills. He reports feeling better.   Pt concerned about his wife. He advised that he does not know where she is. He was advised that his wife was discharged.   Pt wants information regarding his wife's discharge and location. Family is not advising him of information and he is concerned.   [facility] financial counselor contacted to speak with family regarding finances in the event that family does not take patient home and placement has to be located.   Expected Discharge Plan: Home/Self Care Barriers to Discharge: Continued Medical Work up  Expected Discharge Plan and Services   Discharge Planning Services: CM Consult   Living arrangements for the past 2 months: Single Family Home                                       Social Determinants of Health (SDOH) Interventions SDOH Screenings   Food Insecurity: No Food Insecurity (11/30/2022)  Housing: Unknown (10/29/2023)   Received [facility]  Transportation Needs: No Transportation Needs (11/30/2022)  Utilities: Not At Risk (11/30/2022)  Alcohol Screen: Low Risk  (11/30/2022)  Depression (PHQ2-9): Low Risk  (02/04/2024)  Financial Resource Strain: Low Risk  (11/30/2022)  Physical Activity: Inactive (11/30/2022)  Social Connections: Moderately Integrated (11/30/2022)  Stress: No Stress Concern Present (11/30/2022)  Tobacco Use: Low Risk  (02/04/2024)    Readmission Risk Interventions     No data to display
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
## 2024-03-11 MED ORDER — PRAVASTATIN SODIUM 20 MG PO TABS
40.0000 mg | ORAL_TABLET | Freq: Every day | ORAL | Status: DC
  Administered 2024-03-11 – 2024-03-14 (×4): 40 mg via ORAL
  Filled 2024-03-11 (×4): qty 2

## 2024-03-11 MED ORDER — DUTASTERIDE 0.5 MG PO CAPS
0.5000 mg | ORAL_CAPSULE | Freq: Every day | ORAL | Status: DC
  Administered 2024-03-11 – 2024-03-14 (×4): 0.5 mg via ORAL
  Filled 2024-03-11 (×4): qty 1

## 2024-03-11 NOTE — ED Notes (Signed)
 Pt given ice cream
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
## 2024-03-11 NOTE — ED Provider Notes (Signed)
-----------------------------------------   6:07 AM on 03/11/2024 -----------------------------------------   Blood pressure 120/72, pulse 76, temperature 98.1 F (36.7 C), temperature source Oral, resp. rate 15, SpO2 95%.  The patient is calm and cooperative at this time.  There have been no acute events since the last update.  Awaiting disposition plan from [facility] team.   Lynnda Sas, MD 03/11/24 [phone]

## 2024-03-11 NOTE — ED Notes (Signed)
 Pt requesting home medications. Message sent to MD.

## 2024-03-11 NOTE — ED Notes (Signed)
APS at the bedside

## 2024-03-11 NOTE — ED Notes (Signed)
Pt lunch tray at bedside  

## 2024-03-12 MED ORDER — NETARSUDIL-LATANOPROST 0.02-0.005 % OP SOLN: 1.0000 [drp] | Freq: Every day | OPHTHALMIC | Status: DC

## 2024-03-12 NOTE — ED Notes (Signed)
 Patient provided warm blankets, ice and drink, bed changed. Patient in no distress. Will continue to monitor.

## 2024-03-12 NOTE — Progress Notes (Signed)
 PT Cancellation Note  Patient Details Name: Eugene Murphy MRN: [id number] DOB: 1931/12/03   Cancelled Treatment:     PT attempt. Pt was awake but requested not to get up at this time."I 've been up already." Acute PT will continue to follow and progress per current POC.    Koleen Perna 03/12/2024, 10:21 AM

## 2024-03-12 NOTE — ED Provider Notes (Signed)
 Emergency Medicine Observation Re-evaluation Note  Physical Exam   BP (!) 157/87   Pulse 69   Temp 97.7 F (36.5 C) (Oral)   Resp 18   SpO2 98%   Patient appears in no acute distress.  ED Course / MDM   No reported events during my shift at the time of this note.   Pt is awaiting dispo from consultants   Buell Carmin MD    Buell Carmin, MD 03/12/24 [phone]

## 2024-03-13 MED ORDER — LATANOPROST 0.005 % OP SOLN
1.0000 [drp] | Freq: Every day | OPHTHALMIC | Status: DC
  Administered 2024-03-13: 1 [drp] via OPHTHALMIC
  Filled 2024-03-13: qty 2.5

## 2024-03-13 MED ORDER — NETARSUDIL-LATANOPROST 0.02-0.005 % OP SOLN: 1.0000 [drp] | Freq: Every day | OPHTHALMIC | Status: DC

## 2024-03-13 NOTE — Progress Notes (Signed)
 Physical Therapy Treatment Patient Details Name: Eugene Murphy MRN: [id number] DOB: [birth year] Today's Date: 03/13/2024   History of Present Illness Pt is an 89 yo male that presented to the ED for agitated behaviors, AMS. PMH of emphysema, dementia, GERD, COPD.    PT Comments  Pt was sitting in recliner upon arrival. He is A and agreeable to session. Was able to easily ambulate 400 ft without AD (carried SPC but did not use). No LOB or safety concern. Recommend HHPT at DC to maximize independence and safety with all ADLs while improving dynamic standing balance.    If plan is discharge home, recommend the following: Assistance with cooking/housework;Assist for transportation;Help with stairs or ramp for entrance     Equipment Recommendations  None recommended by PT       Precautions / Restrictions Precautions Precautions: Fall Restrictions Weight Bearing Restrictions Per Provider Order: No     Mobility  Bed Mobility  General bed mobility comments: In recliner pre/post session    Transfers Overall transfer level: Modified independent Equipment used: Straight cane, None Transfers: Sit to/from Stand Sit to Stand: Modified independent (Device/Increase time)   Ambulation/Gait Ambulation/Gait assistance: Modified independent (Device/Increase time) Gait Distance (Feet): 400 Feet Assistive device: Straight cane, None Gait Pattern/deviations: Step-through pattern Gait velocity: WNL General Gait Details: pt started ambulation with using SPC however ends up just carrying it so author elected not to use AD    Balance Overall balance assessment: Needs assistance Sitting-balance support: Feet supported Sitting balance-Leahy Scale: Good     Standing balance support: Bilateral upper extremity supported, During functional activity, Reliant on assistive device for balance Standing balance-Leahy Scale: Good      Communication Communication Communication: Impaired Factors  Affecting Communication: Hearing impaired  Cognition Arousal: Alert Behavior During Therapy: WFL for tasks assessed/performed   PT - Cognitive impairments: No apparent impairments    Following commands: Intact      Cueing Cueing Techniques: Verbal cues         Pertinent Vitals/Pain Pain Assessment Pain Assessment: No/denies pain     PT Goals (current goals can now be found in the care plan section) Acute Rehab PT Goals Patient Stated Goal: I can wait to go home Progress towards PT goals: Progressing toward goals    Frequency    Min 2X/week       AM-PAC PT "6 Clicks" Mobility   Outcome Measure  Help needed turning from your back to your side while in a flat bed without using bedrails?: None Help needed moving from lying on your back to sitting on the side of a flat bed without using bedrails?: None Help needed moving to and from a bed to a chair (including a wheelchair)?: None Help needed standing up from a chair using your arms (e.g., wheelchair or bedside chair)?: A Little Help needed to walk in hospital room?: A Little Help needed climbing 3-5 steps with a railing? : A Little 6 Click Score: 21    End of Session   Activity Tolerance: Patient tolerated treatment well Patient left: in chair;with call bell/phone within reach;with chair alarm set Nurse Communication: Mobility status PT Visit Diagnosis: Other abnormalities of gait and mobility (R26.89);Difficulty in walking, not elsewhere classified (R26.2);Muscle weakness (generalized) (M62.81)     Time: 6440-3474 PT Time Calculation (min) (ACUTE ONLY): 8 min  Charges:    $Gait Training: 8-22 mins PT General Charges $$ ACUTE PT VISIT: 1 Visit  Chester Costa PTA 03/13/24, 3:48 PM

## 2024-03-13 NOTE — TOC Progression Note (Signed)
 Transition of Care [facility]) - Progression Note    Patient Details  Name: Eugene Murphy MRN: [id number] Date of Birth: February 03, 1932  Transition of Care  [facility]) CM/SW Contact  [name], RN Phone Number: 03/13/2024, 8:39 AM  Clinical Narrative:    Received a voice message from his niece [name] that Taj's daughter Adah Acron and granddaughter Jolly Needle will be taking over from here. [name] states she's given Adah Acron all the information from [facility], etc and to contact them with further needs. There was also an email from financial navigator Kayleen Party that family doesn't want to pursue medicaid and will self pay for a private caregiver. TOC to continue to follow.    Expected Discharge Plan: Home/Self Care Barriers to Discharge: Continued Medical Work up  Expected Discharge Plan and Services   Discharge Planning Services: CM Consult   Living arrangements for the past 2 months: Single Family Home                                       Social Determinants of Health (SDOH) Interventions SDOH Screenings   Food Insecurity: No Food Insecurity (11/30/2022)  Housing: Unknown (10/29/2023)   [facility]  Transportation Needs: No Transportation Needs (11/30/2022)  Utilities: Not At Risk (11/30/2022)  Alcohol Screen: Low Risk  (11/30/2022)  Depression (PHQ2-9): Low Risk  (02/04/2024)  Financial Resource Strain: Low Risk  (11/30/2022)  Physical Activity: Inactive (11/30/2022)  Social Connections: Moderately Integrated (11/30/2022)  Stress: No Stress Concern Present (11/30/2022)  Tobacco Use: Low Risk  (02/04/2024)    Readmission Risk Interventions     No data to display

## 2024-03-13 NOTE — TOC Progression Note (Signed)
 Transition of Care [facility]) - Progression Note    Patient Details  Name: Eugene Murphy MRN: [id number] Date of Birth: 09-17-1932  Transition of Care [facility]) CM/SW Contact  [place] L Frutoso [place], [place] Phone Number: 03/13/2024, 12:10 PM  Clinical Narrative:     CSW spoke with pt daughter, Adah Acron. Adah Acron advised that she will be picking her father up tomorrow. CSW confirmed that daughter is advising that patient can be discharged to her care. Adah Acron advised that her father can be discharged to her care. Adah Acron advised that her family had a meeting and they will all assist. She stated that between herself and other relatives, they will monitor patient at home in the evening hours.   CSW and daughter discussed home care options. Daughter stated that she is open to [facility] being set up but she is worried that patient will get agitated. She stated that patient does not want people in the home. CSW offered to speak with patient before discharge to give him some encouraging words.   Expected Discharge Plan: Home/Self Care Barriers to Discharge: Continued Medical Work up  Expected Discharge Plan and Services   Discharge Planning Services: CM Consult   Living arrangements for the past 2 months: Single Family Home                                       Social Determinants of Health (SDOH) Interventions SDOH Screenings   Food Insecurity: No Food Insecurity (11/30/2022)  Housing: Unknown (10/29/2023)   Received from [facility]  Transportation Needs: No Transportation Needs (11/30/2022)  Utilities: Not At Risk (11/30/2022)  Alcohol Screen: Low Risk  (11/30/2022)  Depression (PHQ2-9): Low Risk  (02/04/2024)  Financial Resource Strain: Low Risk  (11/30/2022)  Physical Activity: Inactive (11/30/2022)  Social Connections: Moderately Integrated (11/30/2022)  Stress: No Stress Concern Present (11/30/2022)  Tobacco Use: Low Risk  (02/04/2024)    Readmission Risk Interventions     No  data to display

## 2024-03-13 NOTE — TOC Progression Note (Signed)
 Transition of Care [facility]) - Progression Note    Patient Details  Name: Eugene Murphy MRN: [id number] Date of Birth: 04/19/1932  Transition of Care [facility]) CM/SW Contact  Seychelles L Merrick Maggio, [place] Phone Number: 03/13/2024, 12:15 PM  Clinical Narrative:     CSW notified medical team that patient daughter advised that she will be picking patient up for discharge tomorrow. CSW requested that orders are placed for [facility] to include Social Work and Nursing.   Patient will be discharged tomorrow.   Expected Discharge Plan: Home/Self Care Barriers to Discharge: Continued Medical Work up  Expected Discharge Plan and Services   Discharge Planning Services: CM Consult   Living arrangements for the past 2 months: Single Family Home                                       Social Determinants of Health (SDOH) Interventions SDOH Screenings   Food Insecurity: No Food Insecurity (11/30/2022)  Housing: Unknown (10/29/2023)   Received from [facility]  Transportation Needs: No Transportation Needs (11/30/2022)  Utilities: Not At Risk (11/30/2022)  Alcohol Screen: Low Risk  (11/30/2022)  Depression (PHQ2-9): Low Risk  (02/04/2024)  Financial Resource Strain: Low Risk  (11/30/2022)  Physical Activity: Inactive (11/30/2022)  Social Connections: Moderately Integrated (11/30/2022)  Stress: No Stress Concern Present (11/30/2022)  Tobacco Use: Low Risk  (02/04/2024)    Readmission Risk Interventions     No data to display
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
## 2024-03-13 NOTE — ED Provider Notes (Signed)
-----------------------------------------   5:22 AM on 03/13/2024 -----------------------------------------   Blood pressure (!) 141/89, pulse 76, temperature 97.8 F (36.6 C), temperature source Oral, resp. rate 16, SpO2 97%.  The patient is calm and cooperative at this time.  There have been no acute events since the last update.  Awaiting disposition plan from Social Work team.   Nalani Andreen J, MD 03/13/24 [phone]

## 2024-03-13 NOTE — TOC Progression Note (Signed)
 Transition of Care [facility]) - Progression Note    Patient Details  Name: Eugene Murphy MRN: [id number] Date of Birth: 1931-11-05  Transition of Care [facility]) CM/SW Contact  [place] L Braylin Xu, [place] Phone Number: 03/13/2024, 1:41 PM  Clinical Narrative:     CSW spoke with Bartholomew Light [facility]). Referral made for pt to receive [facility] and Social Work. Patient is expected to discharge tomorrow morning.   Bartholomew Light advised that patients wife was taken to [facility] to stay with family. Patient is returning to his home.   Expected Discharge Plan: Home/Self Care Barriers to Discharge: Continued Medical Work up  Expected Discharge Plan and Services   Discharge Planning Services: CM Consult   Living arrangements for the past 2 months: Single Family Home                                       Social Determinants of Health (SDOH) Interventions SDOH Screenings   Food Insecurity: No Food Insecurity (11/30/2022)  Housing: Unknown (10/29/2023)   Received from [facility]  Transportation Needs: No Transportation Needs (11/30/2022)  Utilities: Not At Risk (11/30/2022)  Alcohol Screen: Low Risk  (11/30/2022)  Depression (PHQ2-9): Low Risk  (02/04/2024)  Financial Resource Strain: Low Risk  (11/30/2022)  Physical Activity: Inactive (11/30/2022)  Social Connections: Moderately Integrated (11/30/2022)  Stress: No Stress Concern Present (11/30/2022)  Tobacco Use: Low Risk  (02/04/2024)    Readmission Risk Interventions     No data to display

## 2024-03-13 NOTE — TOC Progression Note (Signed)
 Transition of Care  [facility]) - Progression Note    Patient Details  Name: Eugene Murphy MRN: [id number] Date of Birth: 1932-09-26  Transition of Care [facility]) CM/SW Contact  [place] L Kilian Schwartz, [place] Phone Number: 03/13/2024, 2:55 PM  Clinical Narrative:     CSW left a voicemail message for Adah Acron, daughter of patient. CSW advised that [facility] was set up with [facility] re: RN, PT and Social Work.   Expected Discharge Plan: Home/Self Care Barriers to Discharge: Continued Medical Work up  Expected Discharge Plan and Services   Discharge Planning Services: CM Consult   Living arrangements for the past 2 months: Single Family Home                           HH Arranged: RN, PT, Social Work [employer] Agency: [facility] Date HH Agency Contacted: 03/13/24 Time HH Agency Contacted: 0200 Representative spoke with at [facility] Agency: Bartholomew Light   Social Determinants of Health (SDOH) Interventions SDOH Screenings   Food Insecurity: No Food Insecurity (11/30/2022)  Housing: Unknown (10/29/2023)   Received from [facility]  Transportation Needs: No Transportation Needs (11/30/2022)  Utilities: Not At Risk (11/30/2022)  Alcohol Screen: Low Risk  (11/30/2022)  Depression (PHQ2-9): Low Risk  (02/04/2024)  Financial Resource Strain: Low Risk  (11/30/2022)  Physical Activity: Inactive (11/30/2022)  Social Connections: Moderately Integrated (11/30/2022)  Stress: No Stress Concern Present (11/30/2022)  Tobacco Use: Low Risk  (02/04/2024)    Readmission Risk Interventions     No data to display

## 2024-03-14 NOTE — ED Provider Notes (Signed)
-----------------------------------------   11:35 AM on 03/14/2024 ----------------------------------------- Patient cleared for discharge to home with the care of family by social work.  Home health arrangements have been made, patient without any complaints at this time.   Twilla Galea, MD 03/14/24 1136

## 2024-03-14 NOTE — ED Provider Notes (Signed)
-----------------------------------------   5:30 AM on 03/14/2024 -----------------------------------------   Blood pressure (!) 146/62, pulse 70, temperature 98.3 F (36.8 C), resp. rate 18, SpO2 98%.  The patient is calm and cooperative at this time.  There have been no acute events since the last update.  Awaiting disposition plan from Social Work team.   Min Tunnell J, MD 03/14/24 [phone]
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
## 2024-03-30 ENCOUNTER — Telehealth: Payer: Self-pay

## 2024-03-30 NOTE — Telephone Encounter (Signed)
 Copied from CRM [id number]. Topic: Clinical - Home Health Verbal Orders >> Mar 30, 2024  2:54 PM Sophia H wrote: Caller/Agency: Glade Lenis - [facility] home health Callback Number: [phone] (secured) can lvm if she does not pick up  Service Requested: Physical Therapy Frequency: 1 week 4, 1 every 2 weeks 4 Any new concerns about the patient? Not really, stated she does believe the patient can use a bit more help around the house but she isn't sure if anything can be done about that. It is also hard for his wife to move around and she is the only one who is helping right now.

## 2024-03-31 NOTE — Telephone Encounter (Signed)
 VO given to [facility]

## 2024-04-07 ENCOUNTER — Telehealth: Payer: Self-pay

## 2024-04-07 NOTE — Telephone Encounter (Signed)
 Copied from CRM [id number]. Topic: General - Other >> Apr 07, 2024  9:49 AM Larissa S wrote: Reason for CRM: Harlene Iba, SW with [facility] and Adult [employer] would like to know if provider has any concerns about patient's ability to care for himself and appointment compliance.  Callback # [phone].

## 2024-04-07 NOTE — Telephone Encounter (Signed)
 No answer left detailed vm PCP out of office and will get back to her once PCP reviews message.

## 2024-04-23 NOTE — Telephone Encounter (Unsigned)
 Copied from CRM [id number]. Topic: General - Other >> Apr 23, 2024  2:22 PM Deleta RAMAN wrote: Reason for CRM: social services calling in regards to patient due to missed call on July 11th. A voicemail was left telling jessica h. To give a call back for any questions or concerns. She would like for the pcp to return the call at [phone]

## 2024-04-27 ENCOUNTER — Telehealth: Payer: Self-pay

## 2024-04-27 NOTE — Telephone Encounter (Signed)
 Copied from CRM [id number]. Topic: Clinical - Medical Advice >> Apr 27, 2024  3:17 PM Taleah C wrote: Reason for CRM: Jewel from [facility] health called regarding concerns about having an updated med list.   They were referred to help assist the patient with his medications but there is no one in the patients home to teach.   Please advise at [phone]).

## 2024-04-28 NOTE — Telephone Encounter (Signed)
 Niece Emergency Contact [phone] Montie Kitty Daughter [phone] Rumalda Kitty Granddaughter [phone]    Do uou know which they should contact?

## 2024-04-28 NOTE — Telephone Encounter (Signed)
 Called Jewel no answer left detailed vm to call back to inform him about who to reach regards patient. He can reach anyone form below.

## 2024-05-08 ENCOUNTER — Telehealth: Payer: Self-pay

## 2024-05-08 DIAGNOSIS — F01518 Vascular dementia, unspecified severity, with other behavioral disturbance: Secondary | ICD-10-CM

## 2024-05-08 DIAGNOSIS — F323 Major depressive disorder, single episode, severe with psychotic features: Secondary | ICD-10-CM

## 2024-05-08 DIAGNOSIS — E785 Hyperlipidemia, unspecified: Secondary | ICD-10-CM

## 2024-05-08 DIAGNOSIS — J3089 Other allergic rhinitis: Secondary | ICD-10-CM

## 2024-05-08 MED ORDER — DONEPEZIL HCL 10 MG PO TABS: 10.0000 mg | ORAL_TABLET | Freq: Every day | ORAL | 0 refills | Status: DC

## 2024-05-08 MED ORDER — CITALOPRAM HYDROBROMIDE 10 MG PO TABS: 10.0000 mg | ORAL_TABLET | Freq: Every day | ORAL | 0 refills | Status: AC

## 2024-05-08 MED ORDER — PRAVASTATIN SODIUM 40 MG PO TABS: 40.0000 mg | ORAL_TABLET | Freq: Every day | ORAL | 0 refills | Status: AC

## 2024-05-08 MED ORDER — QUETIAPINE FUMARATE 25 MG PO TABS: 25.0000 mg | ORAL_TABLET | Freq: Every day | ORAL | 0 refills | Status: AC

## 2024-05-08 MED ORDER — LEVOCETIRIZINE DIHYDROCHLORIDE 5 MG PO TABS: 5.0000 mg | ORAL_TABLET | Freq: Every evening | ORAL | 0 refills | Status: AC

## 2024-05-08 NOTE — Telephone Encounter (Signed)
 Spoke with patient spouse and will send all rx to [facility].

## 2024-05-08 NOTE — Telephone Encounter (Signed)
 Copied from CRM [id number]. Topic: Clinical - Medication Question >> May 08, 2024  3:33 PM Fonda T wrote: Reason for CRM: Reason for CRM: Received call from Bard with [facility], calling requesting all medications be transferred as this is the patient preferred pharmacy at this time.   Has been updated in patient pharmacy list as well.   Requesting new e-scripts for all medications patient currently taking.   [facility] - [place], [place] - [address] [place] [address] Phone: [phone] Fax: [phone]

## 2024-06-01 ENCOUNTER — Ambulatory Visit: Admitting: Family Medicine

## 2024-07-08 ENCOUNTER — Ambulatory Visit (INDEPENDENT_AMBULATORY_CARE_PROVIDER_SITE_OTHER)

## 2024-07-08 DIAGNOSIS — Z23 Encounter for immunization: Secondary | ICD-10-CM | POA: Diagnosis not present

## 2024-07-08 NOTE — Progress Notes (Signed)
 Patient is in office today for a nurse visit for Flu Immunization. Patient Injection was given in the  Right deltoid. Patient tolerated injection well.

## 2024-09-23 ENCOUNTER — Other Ambulatory Visit: Payer: Self-pay | Admitting: Family Medicine

## 2024-09-23 DIAGNOSIS — F01518 Vascular dementia, unspecified severity, with other behavioral disturbance: Secondary | ICD-10-CM

## 2024-09-26 NOTE — Telephone Encounter (Signed)
 Requested Prescriptions  Pending Prescriptions Disp Refills   donepezil  (ARICEPT ) 10 MG tablet [Pharmacy Med Name: DONEPEZIL  HYDROCHLORIDE 10MG  TABLET] 90 tablet 0    Sig: Take 1 tablet (10 mg total) by mouth at bedtime. OFFICE VISIT NEEDED FOR ADDITIONAL REFILLS     Neurology:  Alzheimer's Agents Failed - 09/26/2024  7:11 AM      Failed - Valid encounter within last 6 months    Recent Outpatient Visits           7 months ago Centrilobular emphysema [facility])   [place] [facility] Glenard Mire, MD       Future Appointments             In 2 months Stoioff, Glendia BROCKS, MD [facility] Urology [facility]

## 2024-10-28 ENCOUNTER — Telehealth: Payer: Self-pay
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
## 2024-10-30 ENCOUNTER — Other Ambulatory Visit: Payer: Self-pay | Admitting: Family Medicine

## 2024-10-30 DIAGNOSIS — F01518 Vascular dementia, unspecified severity, with other behavioral disturbance: Secondary | ICD-10-CM

## 2024-10-30 DIAGNOSIS — F323 Major depressive disorder, single episode, severe with psychotic features: Secondary | ICD-10-CM

## 2024-10-30 NOTE — Telephone Encounter (Signed)
 Requested medications are due for refill today.  yes  Requested medications are on the active medications list.  yes  Last refill. 05/08/2024 #90 0 rf  Future visit scheduled.   no  Notes to clinic.  Refill not delegated.    Requested Prescriptions  Pending Prescriptions Disp Refills   QUEtiapine  (SEROQUEL ) 25 MG tablet [Pharmacy Med Name: QUETIAPINE  TAB 25MG ] 90 tablet 3    Sig: TAKE 1 TABLET AT BEDTIME     Not Delegated - Psychiatry:  Antipsychotics - Second Generation (Atypical) - quetiapine  Failed - 10/30/2024 11:45 AM      Failed - This refill cannot be delegated      Failed - TSH in normal range and within 360 days    TSH  Date Value Ref Range Status  06/15/2018 1.946 0.350 - 4.500 uIU/mL Final    Comment:    Performed by a 3rd Generation assay with a functional sensitivity of <=0.01 uIU/mL. Performed at [facility], [address] [place], [place] [address]   12/17/2016 1.33 0.40 - 4.50 mIU/L Final         Failed - Last BP in normal range    BP Readings from Last 1 Encounters:  03/14/24 (!) 145/75         Failed - Valid encounter within last 6 months    Recent Outpatient Visits           8 months ago Centrilobular emphysema [facility])   [place] [facility] Glenard Mire, MD       Future Appointments             In 1 month Stoioff, Glendia BROCKS, MD [facility] Urology [place]            Failed - Lipid Panel in normal range within the last 12 months    Cholesterol, Total  Date Value Ref Range Status  10/20/2015 123 100 - 199 mg/dL Final   Cholesterol  Date Value Ref Range Status  05/15/2023 156 <200 mg/dL Final   LDL Cholesterol (Calc)  Date Value Ref Range Status  05/15/2023 73 mg/dL (calc) Final    Comment:    Reference range: <100 . Desirable range <100 mg/dL for primary prevention;   <70 mg/dL for patients with CHD or diabetic patients  with > or = 2 CHD risk factors. SABRA LDL-C is now calculated using the  Martin-Hopkins  calculation, which is a validated novel method providing  better accuracy than the Friedewald equation in the  estimation of LDL-C.  Gladis APPLETHWAITE et al. SANDREA. 7986;689(80): 2061-2068  (http://education.[employer].com/faq/FAQ164)    HDL  Date Value Ref Range Status  05/15/2023 71 > OR = 40 mg/dL Final  98/87/7982 48 >60 mg/dL Final   Triglycerides  Date Value Ref Range Status  05/15/2023 43 <150 mg/dL Final         Passed - Completed PHQ-2 or PHQ-9 in the last 360 days      Passed - Last Heart Rate in normal range    Pulse Readings from Last 1 Encounters:  03/14/24 73         Passed - CBC within normal limits and completed in the last 12 months    WBC  Date Value Ref Range Status  03/08/2024 7.4 4.0 - 10.5 K/uL Final   RBC  Date Value Ref Range Status  03/08/2024 4.55 4.22 - 5.81 MIL/uL Final   Hemoglobin  Date Value Ref Range Status  03/08/2024 14.1 13.0 - 17.0 g/dL  Final  06/11/2018 12.5 (L) 13.0 - 17.7 g/dL Final   HCT  Date Value Ref Range Status  03/08/2024 41.9 39.0 - 52.0 % Final   Hematocrit  Date Value Ref Range Status  06/11/2018 36.2 (L) 37.5 - 51.0 % Final   MCHC  Date Value Ref Range Status  03/08/2024 33.7 30.0 - 36.0 g/dL Final   [facility]  Date Value Ref Range Status  03/08/2024 31.0 26.0 - 34.0 pg Final   MCV  Date Value Ref Range Status  03/08/2024 92.1 80.0 - 100.0 fL Final  06/11/2018 91 79 - 97 fL Final   No results found for: PLTCOUNTKUC, LABPLAT, POCPLA RDW  Date Value Ref Range Status  03/08/2024 12.7 11.5 - 15.5 % Final  06/11/2018 12.3 12.3 - 15.4 % Final         Passed - CMP within normal limits and completed in the last 12 months    Albumin  Date Value Ref Range Status  03/08/2024 4.2 3.5 - 5.0 g/dL Final  90/95/7980 4.7 3.5 - 4.7 g/dL Final   Alkaline Phosphatase  Date Value Ref Range Status  03/08/2024 42 38 - 126 U/L Final   Alkaline phosphatase (APISO)  Date Value Ref Range Status   05/15/2023 38 35 - 144 U/L Final   ALT  Date Value Ref Range Status  03/08/2024 32 0 - 44 U/L Final   AST  Date Value Ref Range Status  03/08/2024 37 15 - 41 U/L Final   BUN  Date Value Ref Range Status  03/08/2024 24 (H) 8 - 23 mg/dL Final  90/95/7980 12 8 - 27 mg/dL Final   Calcium  Date Value Ref Range Status  03/08/2024 9.4 8.9 - 10.3 mg/dL Final   CO2  Date Value Ref Range Status  03/08/2024 25 22 - 32 mmol/L Final   Creat  Date Value Ref Range Status  05/30/2023 1.04 0.70 - 1.22 mg/dL Final   Creatinine, Ser  Date Value Ref Range Status  03/08/2024 1.30 (H) 0.61 - 1.24 mg/dL Final   Glucose, Bld  Date Value Ref Range Status  03/08/2024 96 70 - 99 mg/dL Final    Comment:    Glucose reference range applies only to samples taken after fasting for at least 8 hours.   Potassium  Date Value Ref Range Status  03/08/2024 4.1 3.5 - 5.1 mmol/L Final   Sodium  Date Value Ref Range Status  03/08/2024 136 135 - 145 mmol/L Final  06/11/2018 135 134 - 144 mmol/L Final   Total Bilirubin  Date Value Ref Range Status  03/08/2024 1.1 0.0 - 1.2 mg/dL Final   Bilirubin Total  Date Value Ref Range Status  06/11/2018 0.6 0.0 - 1.2 mg/dL Final   Protein, ur  Date Value Ref Range Status  03/08/2024 100 (A) NEGATIVE mg/dL Final   Total Protein  Date Value Ref Range Status  03/08/2024 7.0 6.5 - 8.1 g/dL Final  90/95/7980 7.1 6.0 - 8.5 g/dL Final   GFR, Est African American  Date Value Ref Range Status  06/30/2020 80 > OR = 60 mL/min/1.84m2 Final   eGFR  Date Value Ref Range Status  05/30/2023 69 > OR = 60 mL/min/1.47m2 Final   GFR, Est Non African American  Date Value Ref Range Status  06/30/2020 69 > OR = 60 mL/min/1.15m2 Final   GFR, Estimated  Date Value Ref Range Status  03/08/2024 53 (L) >60 mL/min Final    Comment:    (NOTE) Calculated using  the CKD-EPI Creatinine Equation (2021)

## 2024-11-03 NOTE — Telephone Encounter (Signed)
Lvm for an appt.

## 2024-12-21 ENCOUNTER — Ambulatory Visit: Admitting: Urology
